# Patient Record
Sex: Female | Born: 1945
Health system: Southern US, Community
[De-identification: ages and names within clinical notes are randomized; demographics above are authoritative.]

## PROBLEM LIST (undated history)

## (undated) DIAGNOSIS — I10 Essential (primary) hypertension: Secondary | ICD-10-CM

## (undated) DIAGNOSIS — C801 Malignant (primary) neoplasm, unspecified: Secondary | ICD-10-CM

## (undated) DIAGNOSIS — G4730 Sleep apnea, unspecified: Secondary | ICD-10-CM

## (undated) DIAGNOSIS — M1990 Unspecified osteoarthritis, unspecified site: Secondary | ICD-10-CM

## (undated) DIAGNOSIS — J302 Other seasonal allergic rhinitis: Secondary | ICD-10-CM

## (undated) DIAGNOSIS — I4891 Unspecified atrial fibrillation: Secondary | ICD-10-CM

## (undated) HISTORY — PX: WISDOM TOOTH EXTRACTION: SHX21

## (undated) HISTORY — PX: KNEE SURGERY: SHX244

## (undated) HISTORY — PX: BREAST EXCISIONAL BIOPSY: SUR124

## (undated) HISTORY — DX: Essential (primary) hypertension: I10

## (undated) HISTORY — DX: Other seasonal allergic rhinitis: J30.2

---

## 1997-09-04 ENCOUNTER — Other Ambulatory Visit: Admission: RE | Admit: 1997-09-04 | Discharge: 1997-09-04 | Payer: Self-pay | Admitting: Gynecology

## 1998-11-02 ENCOUNTER — Encounter: Admission: RE | Admit: 1998-11-02 | Discharge: 1998-11-02 | Payer: Self-pay | Admitting: Gynecology

## 1998-11-02 ENCOUNTER — Encounter: Payer: Self-pay | Admitting: Gynecology

## 1998-11-15 ENCOUNTER — Encounter: Payer: Self-pay | Admitting: Gynecology

## 1998-11-15 ENCOUNTER — Encounter: Admission: RE | Admit: 1998-11-15 | Discharge: 1998-11-15 | Payer: Self-pay | Admitting: Gynecology

## 1998-12-24 ENCOUNTER — Encounter: Payer: Self-pay | Admitting: Surgery

## 1998-12-24 ENCOUNTER — Encounter (INDEPENDENT_AMBULATORY_CARE_PROVIDER_SITE_OTHER): Payer: Self-pay | Admitting: Specialist

## 1998-12-24 ENCOUNTER — Ambulatory Visit (HOSPITAL_COMMUNITY): Admission: RE | Admit: 1998-12-24 | Discharge: 1998-12-24 | Payer: Self-pay | Admitting: Surgery

## 1999-06-19 ENCOUNTER — Encounter: Admission: RE | Admit: 1999-06-19 | Discharge: 1999-06-19 | Payer: Self-pay | Admitting: Surgery

## 1999-06-19 ENCOUNTER — Encounter: Payer: Self-pay | Admitting: Surgery

## 1999-12-05 ENCOUNTER — Encounter: Payer: Self-pay | Admitting: Gynecology

## 1999-12-05 ENCOUNTER — Encounter: Admission: RE | Admit: 1999-12-05 | Discharge: 1999-12-05 | Payer: Self-pay | Admitting: Gynecology

## 2000-12-07 ENCOUNTER — Encounter: Admission: RE | Admit: 2000-12-07 | Discharge: 2000-12-07 | Payer: Self-pay | Admitting: Gynecology

## 2000-12-07 ENCOUNTER — Encounter: Payer: Self-pay | Admitting: Gynecology

## 2001-11-12 ENCOUNTER — Encounter: Payer: Self-pay | Admitting: Emergency Medicine

## 2001-11-12 ENCOUNTER — Emergency Department (HOSPITAL_COMMUNITY): Admission: EM | Admit: 2001-11-12 | Discharge: 2001-11-12 | Payer: Self-pay | Admitting: Emergency Medicine

## 2001-11-23 ENCOUNTER — Other Ambulatory Visit: Admission: RE | Admit: 2001-11-23 | Discharge: 2001-11-23 | Payer: Self-pay | Admitting: Gynecology

## 2001-12-15 ENCOUNTER — Encounter: Payer: Self-pay | Admitting: Gynecology

## 2001-12-15 ENCOUNTER — Encounter: Admission: RE | Admit: 2001-12-15 | Discharge: 2001-12-15 | Payer: Self-pay | Admitting: Gynecology

## 2002-07-04 ENCOUNTER — Encounter (INDEPENDENT_AMBULATORY_CARE_PROVIDER_SITE_OTHER): Payer: Self-pay | Admitting: *Deleted

## 2002-07-04 ENCOUNTER — Ambulatory Visit (HOSPITAL_COMMUNITY): Admission: RE | Admit: 2002-07-04 | Discharge: 2002-07-04 | Payer: Self-pay | Admitting: Gastroenterology

## 2002-07-08 ENCOUNTER — Encounter: Payer: Self-pay | Admitting: Gastroenterology

## 2002-07-08 ENCOUNTER — Ambulatory Visit (HOSPITAL_COMMUNITY): Admission: RE | Admit: 2002-07-08 | Discharge: 2002-07-08 | Payer: Self-pay | Admitting: Gastroenterology

## 2002-12-13 ENCOUNTER — Other Ambulatory Visit: Admission: RE | Admit: 2002-12-13 | Discharge: 2002-12-13 | Payer: Self-pay | Admitting: Gynecology

## 2003-01-07 HISTORY — PX: OOPHORECTOMY: SHX86

## 2003-01-07 HISTORY — PX: VAGINAL HYSTERECTOMY: SUR661

## 2003-01-10 ENCOUNTER — Encounter: Admission: RE | Admit: 2003-01-10 | Discharge: 2003-01-10 | Payer: Self-pay | Admitting: Gynecology

## 2003-02-01 ENCOUNTER — Observation Stay (HOSPITAL_COMMUNITY): Admission: RE | Admit: 2003-02-01 | Discharge: 2003-02-02 | Payer: Self-pay | Admitting: Gynecology

## 2003-02-01 ENCOUNTER — Encounter (INDEPENDENT_AMBULATORY_CARE_PROVIDER_SITE_OTHER): Payer: Self-pay | Admitting: Specialist

## 2004-01-12 ENCOUNTER — Encounter: Admission: RE | Admit: 2004-01-12 | Discharge: 2004-01-12 | Payer: Self-pay | Admitting: Gynecology

## 2004-01-16 ENCOUNTER — Other Ambulatory Visit: Admission: RE | Admit: 2004-01-16 | Discharge: 2004-01-16 | Payer: Self-pay | Admitting: Gynecology

## 2004-11-08 ENCOUNTER — Encounter: Admission: RE | Admit: 2004-11-08 | Discharge: 2004-11-08 | Payer: Self-pay | Admitting: Orthopedic Surgery

## 2004-12-04 ENCOUNTER — Ambulatory Visit (HOSPITAL_BASED_OUTPATIENT_CLINIC_OR_DEPARTMENT_OTHER): Admission: RE | Admit: 2004-12-04 | Discharge: 2004-12-04 | Payer: Self-pay | Admitting: Orthopedic Surgery

## 2004-12-04 ENCOUNTER — Ambulatory Visit (HOSPITAL_COMMUNITY): Admission: RE | Admit: 2004-12-04 | Discharge: 2004-12-04 | Payer: Self-pay | Admitting: Orthopedic Surgery

## 2005-01-22 ENCOUNTER — Other Ambulatory Visit: Admission: RE | Admit: 2005-01-22 | Discharge: 2005-01-22 | Payer: Self-pay | Admitting: Gynecology

## 2005-02-18 ENCOUNTER — Encounter: Admission: RE | Admit: 2005-02-18 | Discharge: 2005-02-18 | Payer: Self-pay | Admitting: Gynecology

## 2006-04-13 ENCOUNTER — Other Ambulatory Visit: Admission: RE | Admit: 2006-04-13 | Discharge: 2006-04-13 | Payer: Self-pay | Admitting: Gynecology

## 2006-04-14 ENCOUNTER — Encounter: Admission: RE | Admit: 2006-04-14 | Discharge: 2006-04-14 | Payer: Self-pay | Admitting: Gynecology

## 2007-04-29 ENCOUNTER — Other Ambulatory Visit: Admission: RE | Admit: 2007-04-29 | Discharge: 2007-04-29 | Payer: Self-pay | Admitting: Gynecology

## 2007-05-04 ENCOUNTER — Encounter: Admission: RE | Admit: 2007-05-04 | Discharge: 2007-05-04 | Payer: Self-pay | Admitting: Gynecology

## 2008-05-08 ENCOUNTER — Encounter: Admission: RE | Admit: 2008-05-08 | Discharge: 2008-05-08 | Payer: Self-pay | Admitting: Gynecology

## 2008-05-08 ENCOUNTER — Other Ambulatory Visit: Admission: RE | Admit: 2008-05-08 | Discharge: 2008-05-08 | Payer: Self-pay | Admitting: Gynecology

## 2008-05-08 ENCOUNTER — Ambulatory Visit: Payer: Self-pay | Admitting: Gynecology

## 2008-05-08 ENCOUNTER — Encounter: Payer: Self-pay | Admitting: Gynecology

## 2009-09-17 ENCOUNTER — Encounter
Admission: RE | Admit: 2009-09-17 | Discharge: 2009-09-17 | Payer: Self-pay | Source: Home / Self Care | Admitting: Gynecology

## 2009-09-17 ENCOUNTER — Ambulatory Visit: Payer: Self-pay | Admitting: Gynecology

## 2009-09-17 ENCOUNTER — Other Ambulatory Visit
Admission: RE | Admit: 2009-09-17 | Discharge: 2009-09-17 | Payer: Self-pay | Source: Home / Self Care | Admitting: Gynecology

## 2009-11-14 ENCOUNTER — Ambulatory Visit: Payer: Self-pay | Admitting: Gynecology

## 2010-01-11 ENCOUNTER — Encounter
Admission: RE | Admit: 2010-01-11 | Discharge: 2010-01-11 | Payer: Self-pay | Source: Home / Self Care | Attending: Family Medicine | Admitting: Family Medicine

## 2010-02-04 LAB — COMPREHENSIVE METABOLIC PANEL
ALT: 22 U/L (ref 0–35)
Albumin: 3.8 g/dL (ref 3.5–5.2)
Alkaline Phosphatase: 91 U/L (ref 39–117)
CO2: 29 mEq/L (ref 19–32)
Calcium: 10 mg/dL (ref 8.4–10.5)
Creatinine, Ser: 0.63 mg/dL (ref 0.4–1.2)
GFR calc Af Amer: >60 mL/min (ref >60)
GFR calc non Af Amer: >60 mL/min (ref >60)
Potassium: 3.7 mEq/L (ref 3.5–5.1)
Sodium: 139 mEq/L (ref 135–145)
Total Bilirubin: 0.4 mg/dL (ref 0.3–1.2)
Total Protein: 6.6 g/dL (ref 6.0–8.3)

## 2010-02-04 LAB — URINALYSIS, ROUTINE W REFLEX MICROSCOPIC
Bilirubin Urine: NEGATIVE
Protein, ur: NEGATIVE mg/dL
Specific Gravity, Urine: 1.012 (ref 1.005–1.030)
Urine Glucose, Fasting: NEGATIVE mg/dL
pH: 6.5 (ref 5.0–8.0)

## 2010-02-04 LAB — SURGICAL PCR SCREEN
MRSA, PCR: NEGATIVE
Staphylococcus aureus: NEGATIVE

## 2010-02-04 LAB — DIFFERENTIAL
Basophils Absolute: 0 10*3/uL (ref 0.0–0.1)
Basophils Relative: 1 % (ref 0–1)
Eosinophils Absolute: 0.2 10*3/uL (ref 0.0–0.7)
Eosinophils Relative: 2 % (ref 0–5)
Lymphocytes Relative: 22 % (ref 12–46)
Lymphs Abs: 1.7 10*3/uL (ref 0.7–4.0)
Monocytes Relative: 8 % (ref 3–12)
Neutro Abs: 5.2 10*3/uL (ref 1.7–7.7)

## 2010-02-04 LAB — URINE MICROSCOPIC-ADD ON

## 2010-02-04 LAB — CBC
HCT: 38.5 % (ref 36.0–46.0)
Hemoglobin: 12.1 g/dL (ref 12.0–15.0)
MCH: 24.6 pg — ABNORMAL LOW (ref 26.0–34.0)
MCHC: 31.4 g/dL (ref 30.0–36.0)
MCV: 78.3 fL (ref 78.0–100.0)
RBC: 4.92 MIL/uL (ref 3.87–5.11)
RDW: 15.2 % (ref 11.5–15.5)
WBC: 7.7 10*3/uL (ref 4.0–10.5)

## 2010-02-06 ENCOUNTER — Other Ambulatory Visit (HOSPITAL_COMMUNITY): Payer: Self-pay | Admitting: General Surgery

## 2010-02-06 ENCOUNTER — Other Ambulatory Visit: Payer: Self-pay | Admitting: General Surgery

## 2010-02-06 ENCOUNTER — Ambulatory Visit (HOSPITAL_COMMUNITY)
Admission: RE | Admit: 2010-02-06 | Discharge: 2010-02-06 | Disposition: A | Discharge status: Home / Self Care | Payer: Medicare Other | Source: Ambulatory Visit | Attending: General Surgery | Admitting: General Surgery

## 2010-02-06 ENCOUNTER — Observation Stay (HOSPITAL_COMMUNITY)
Admission: RE | Admit: 2010-02-06 | Discharge: 2010-02-07 | Disposition: A | Discharge status: Home / Self Care | Payer: Medicare Other | Attending: General Surgery | Admitting: General Surgery

## 2010-02-06 DIAGNOSIS — K819 Cholecystitis, unspecified: Secondary | ICD-10-CM

## 2010-02-06 DIAGNOSIS — E785 Hyperlipidemia, unspecified: Secondary | ICD-10-CM | POA: Insufficient documentation

## 2010-02-06 DIAGNOSIS — K8010 Calculus of gallbladder with chronic cholecystitis without obstruction: Principal | ICD-10-CM | POA: Insufficient documentation

## 2010-02-06 DIAGNOSIS — I10 Essential (primary) hypertension: Secondary | ICD-10-CM | POA: Insufficient documentation

## 2010-02-06 DIAGNOSIS — M1710 Unilateral primary osteoarthritis, unspecified knee: Secondary | ICD-10-CM | POA: Insufficient documentation

## 2010-02-06 HISTORY — PX: CHOLECYSTECTOMY: SHX55

## 2010-02-09 NOTE — Op Note (Signed)
Bridget [name], Bridget [name]             ACCOUNT NO.:  [id number]  MEDICAL RECORD NO.:  [id number]           PATIENT TYPE:  O  LOCATION:  SDSC                         FACILITY:  [facility]  PHYSICIAN:  Angelia Mould. Derrell Lolling, M.D.DATE OF BIRTH:  1945/07/16  DATE OF PROCEDURE:  02/06/2010 DATE OF DISCHARGE:                              OPERATIVE REPORT   PREOPERATIVE DIAGNOSIS:  Chronic cholecystitis with cholelithiasis  POSTOPERATIVE DIAGNOSIS:  Chronic cholecystitis with cholelithiasis  OPERATION PERFORMED:  Laparoscopic cholecystectomy with intraoperative cholangiogram.  SURGEON:  Angelia Mould. Derrell Lolling, MD  FIRST ASSISTANT:  Anselm Pancoast. Zachery Dakins, MD  OPERATIVE INDICATIONS:  This is a 65 year old Caucasian female with hypertension, hyperlipidemia, and degenerative joint disease of the knee.  She has had two recent episodes of biliary colic, which led to evaluation.  Lab work and liver function tests are normal.  Gallbladder ultrasound shows a single 1.3 cm gallstone and fatty infiltration of the liver.  Common bile duct was not dilated.  She was counseled as an outpatient, and is brought to operating room electively for cholecystectomy.  OPERATIVE FINDINGS:  The gallbladder looked a little bit discolored and chronically inflamed, but was fairly thin walled.  The cholangiogram was normal, showing normal intrahepatic and extrahepatic biliary anatomy, no filling defect, and no obstruction with good flow of contrast into the duodenum.  The liver, stomach, duodenum, small intestine, large intestine, and omentum were grossly normal to inspection.  OPERATIVE TECHNIQUE:  Following the induction of general endotracheal anesthesia, the patient's abdomen was prepped and draped in a sterile fashion.  Intravenous antibiotics were given.  The patient was identified as correct patient and correct procedure, and a time-out was held.  Marcaine 0.5% with epinephrine was used for local  infiltration anesthetic.  A vertically oriented incision was made in the lower rim of the umbilicus.  The fascia was incised in the midline, and the abdominal cavity entered under direct vision.  An 11-mm Hasson trocar was inserted and secured with pursestring suture of 0 Vicryl.  Pneumoperitoneum was created.  Video cam was inserted with visualization and findings as described above.  An 11-mm trocar was placed in subxiphoid region, and two 5-mm trocars placed in the right upper quadrant.  The gallbladder fundus was elevated.  Some adhesions were taken down off the infundibulum, and the infundibulum was retracted laterally.  I dissected the peritoneum off of the cystic duct and the cystic artery and isolated these structures.  The cystic artery was secured with multiple metal clips and divided just as it went onto the wall of the gallbladder. This created a nice critical view behind the cystic duct.  A cholangiogram catheter was inserted into the cystic duct.  A cholangiogram was obtained using the C-arm.  The cholangiogram was normal as described above.  The cholangiogram catheter was removed, the cystic duct was secured with three additional metal clips and then divided.  The gallbladder was then dissected from its bed with electrocautery, placed in the specimen bag and removed.  The operative field was hemostatic.  Irrigation fluid was completely clear.  There was no evidence of any bleeding or bile leak whatsoever.  The  trocars were removed under direct vision.  There was no bleeding from the trocar sites.  The pneumoperitoneum was released.  The fascia at the umbilicus closed with 0 Vicryl sutures.  The skin incisions were closed with subcuticular sutures of 4-0 Monocryl and Dermabond.  The patient tolerated the procedure well and was taken to recovery room in stable condition.  Estimated blood loss was about 10 mL.  Complications none. Sponge, needle, and instruments counts were  correct.     Angelia Mould. Derrell Lolling, M.D.     HMI/MEDQ  D:  02/06/2010  T:  02/07/2010  Job:  [id number]  cc:   Duncan Dull, M.D. Timothy P. Fontaine, M.D.  Electronically Signed by Claud Kelp M.D. on 02/09/2010 12:49:22 PM

## 2010-05-24 NOTE — H&P (Signed)
NAME:  Bridget [name], Bridget [name]                       ACCOUNT NO.:  [id number]   MEDICAL RECORD NO.:  [id number]                   PATIENT TYPE:  INP   LOCATION:  NA                                   [facility]AC[facility]   PHYSICIAN:  Timothy P. Fontaine, M.D.           DATE OF BIRTH:  01/06/1946   DATE OF ADMISSION:  02/01/2003  DATE OF DISCHARGE:                                HISTORY & PHYSICAL   Patient being admitted to [facility] February 01, 2003 at 7:30 a.m. for  day surgery.   CHIEF COMPLAINT:  Worsening cystocele, rectocele, uterine prolapse.   HISTORY OF PRESENT ILLNESS:  [age]-year-old G3, P2, AB1 female with a  long history of uterine prolapse, cystocele, rectocele which has  progressively gotten worse and is now significantly symptomatic.  Patient is  admitted at this time for TVH/BSO/A&P repair.  On questioning the patient  does not have stress urinary incontinence but is mainly bothered by  something protruding through the vagina.   PAST MEDICAL HISTORY:  High blood pressure.   PAST SURGICAL HISTORY:  Unremarkable.   CURRENT MEDICATIONS:  Lotensin, multivitamins, calcium.   ALLERGIES:  CODEINE causes nausea/vomiting.   FAMILY HISTORY:  Unremarkable.   SOCIAL HISTORY:  Unremarkable.   REVIEW OF SYSTEMS:  Noncontributory.   ADMISSION PHYSICAL EXAMINATION:  VITAL SIGNS:  Afebrile, vital signs are  stable, blood pressure 122/80.  HEENT:  Normal.  LUNGS:  Clear.  CARDIAC:  Regular rate without rubs, murmurs, or gallops.  ABDOMEN:  Benign.  PELVIC:  External/BUS/vagina with second degree cystocele, first degree  rectocele.  Cervix within fingertip of vaginal opening with straining,  cervix grossly normal.  Uterus normal size, midline, immobile, nontender.  Adnexa without masses or tenderness.  Rectovaginal exam confirms rectocele.   ASSESSMENT:  [age]-year-old G3, P2, AB1 female with long history of  uterine prolapse, cystocele, rectocele which has  progressively gotten worse  now that it is bothersome wants surgical repair.  Patient has no significant  history of urinary incontinence, had an ultrasound this past year which  showed a normal-size uterus with several small myomas, adnexa without  pathology.  Options for management were reviewed with the patient and she  wants to proceed with a surgical repair.  Proposed surgery expected  intraoperative/postoperative courses were reviewed with her risks, benefits,  indications, and alternatives.  We will plan on vaginal hysterectomy with  bilateral salpingo-oophorectomy if possible.  Discussed with patient and her  daughter that we may not be able to remove both ovaries vaginally and if  they appear normal or palpate normal the issue is whether we should make  further surgery such as laparoscopic or exploratory laparotomy for ovarian  retrieval.  The patient prefers not to make a separate incision, that if I  cannot retrieve one or both ovaries that I can leave these, she understands  the long-term risks of ovarian cancer and other pathologies which would  require reoperation  and understands and accepts this.  Patient also clearly  understands that at any time during the procedure I may need to switch to an  abdominal approach if I feel it is unsafe to proceed with the vaginal  approach and/or complications arise and she understands and accepts this.  I  also reviewed with her she is not having symptoms of urinary incontinence  therefore, we will not proceed with any type of bladder suspension.  She  further understands that by repairing her cystocele she may develop symptoms  of urinary incontinence and may need to be further evaluated by a urologist  at that time to consider a bladder suspension-type procedure.  Sexuality  following hysterectomy was also reviewed with her and the potential for  orgasmic dysfunction as well as persistent dyspareunia was discussed,  understood, and  accepted.  The risks of infection both internal requiring  prolonged antibiotics, abscess formation requiring reoperation and drainage  of abscess collections as well as if indeed incisions are made abdominally  the potential for wound infections and wound complications requiring opening  and draining of incisions and closure by secondary intention was discussed,  understood, and accepted.  The risk of hemorrhage necessitating transfusion  and the risks of transfusion were reviewed with her to include transfusion  reaction, hepatitis, human immunodeficiency virus, mad cow disease and other  unknown entities.  The risk of inadvertant injury to internal organs  including bowel, bladder, ureters, vessels, and nerves necessitating major  exploratory reparative surgeries and future reparative surgeries including  ostomy formation was discussed, understood, and accepted.  The patient  understands that we are working closely with the bladder as well as the  rectum and that realistic injury to these structures was discussed,  understood, and accepted.  I also reviewed with her the risks of surgery in  general to include the risks of venous thrombosis, pulmonary embolus,  stroke, heart attack and we will plan on intermittent pneumatic thigh-high  stockings.  The need for early and persistent ambulation following the  surgery was also discussed with her and her daughter.  The patient's  questions were answered to her satisfaction and she is ready to proceed with  surgery.                                               Timothy P. Audie Box, M.D.    TPF/MEDQ  D:  01/24/2003  T:  01/25/2003  Job:  [id number]

## 2010-05-24 NOTE — Discharge Summary (Signed)
NAME:  Bridget [name], Bridget [name]                       ACCOUNT NO.:  [id number]   MEDICAL RECORD NO.:  [id number]                   PATIENT TYPE:  OBV   LOCATION:  9310                                 FACILITY:  [facility]   PHYSICIAN:  Timothy P. Fontaine, M.D.           DATE OF BIRTH:  04/23/1945   DATE OF ADMISSION:  02/01/2003  DATE OF DISCHARGE:  02/02/2003                                 DISCHARGE SUMMARY   DISCHARGE DIAGNOSIS:  Cystocele, rectocele, uterine prolapse.   PROCEDURE:  Total vaginal hysterectomy with a right salpingo-oophorectomy,  anterior and posterior colporrhaphy.   HISTORY OF PRESENT ILLNESS:  A 65 year old, gravida 3, para 2, AB1, with a  long history of uterine prolapse, cystocele, rectocele, which has gotten  progressively worse. She is now significantly symptomatic and she is  admitted at this time for total vaginal hysterectomy, right salpingo-  oophorectomy, and A&P repair.  She states that she has noted increased  stress urinary continence, but is mainly bothered by something protruding  through her vagina. Significant history is hypertension which she does take  Lotensin for.   HOSPITAL COURSE:  She was admitted to the hospital on February 01, 2003, and  she had a total vaginal hysterectomy with a right salpingo-oophorectomy and  A&P repair. She did well postoperatively and she was discharged home on her  first postoperative day. She remained afebrile. She was able to void. She  was discharged home on her first postoperative day.   DISPOSITION:  Discharged home. Instructed to follow up in the office in two  weeks or as needed. A prescription for Tylox as needed is given.  Postoperative care was reviewed. Resume Lotensin.     Davonna Belling. Young, N.P.                      Timothy P. Audie Box, M.D.    Bridget [name]  D:  03/09/2003  T:  03/09/2003  Job:  [id number]

## 2010-05-24 NOTE — Op Note (Signed)
NAMELORRAINE, CIMMINO             ACCOUNT NO.:  [id number]   MEDICAL RECORD NO.:  [id number]          PATIENT TYPE:  AMB   LOCATION:  DSC                          FACILITY:  [facility]   PHYSICIAN:  Mila Homer. Sherlean Foot, M.D. DATE OF BIRTH:  [birth year]   DATE OF PROCEDURE:  12/04/2004  DATE OF DISCHARGE:                                 OPERATIVE REPORT   PREOPERATIVE DIAGNOSIS:  Left knee arthritis and medial meniscal tear.   POSTOPERATIVE DIAGNOSIS:  Left knee arthritis and medial and lateral  meniscal tears.   PROCEDURE:  Left knee arthroscopy with partial medial and partial lateral  meniscectomies, chondroplasty of the medial and patellofemoral compartment.   INDICATION FOR PROCEDURE:  The patient is a 65 year old white female with  mechanical symptoms.  Informed consent was obtained.   DESCRIPTION OF PROCEDURE:  The patient was taken to the operating room and  administered MAC.  The left lower extremity was prepped and draped in the  usual sterile fashion.  A #11 blade, blunt trocar and cannula were used to  create inferolateral and inferolateral medial portals.  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  Completely eburnated bone was present.  There were a couple of  loose edges in the margins and these were debrided with the Automatic Data  shaver.  There were some loose bodies in the joint and these were debrided  as well.  I then went into the medial compartment.  There was a  posterolateral meniscal tear; it was not really that large; I do think it  definitively could be asymptomatic; this was debrided with straight basket  and upbiting basket forceps and a Automatic Data shaver.  There was grade 3  chondromalacia over much of the medial femoral condyle and grade 2 over much  of the tibial plateau.  ACL and PCL were normal.  There was a large  osteophyte on the lateral wall of the medial femoral condyle and the notch.  The lateral  compartment actually had grade 1 chondromalacia only, but there  was degenerative tearing along the entire circumference of the lateral  meniscus; this was cleaned up and debrided with a straight basket forceps,  upbiting basket forceps and a Biochemist, clinical.  I then lavaged the joint,  took 1 further tour and then evacuated the knee of fluid and instruments,  closed with 4-0 nylon sutures and dressed with Xeroform, dressing sponges,  sterile Webril and an Ace wrap.   COMPLICATIONS:  None.   DRAINS:  None.           ______________________________  Mila Homer. Sherlean Foot, M.D.     SDL/MEDQ  D:  12/04/2004  T:  12/05/2004  Job:  [id number]

## 2010-05-24 NOTE — Op Note (Signed)
NAME:  Bridget [name], Bridget [name]                       ACCOUNT NO.:  [id number]   MEDICAL RECORD NO.:  [id number]                   PATIENT TYPE:  INP   LOCATION:  9399                                 FACILITY:  [facility]   PHYSICIAN:  Timothy P. Fontaine, M.D.           DATE OF BIRTH:  08-08-1945   DATE OF PROCEDURE:  02/01/2003  DATE OF DISCHARGE:                                 OPERATIVE REPORT   PREOPERATIVE DIAGNOSES:  1. Cystocele.  2. Rectocele.  3. Uterine prolapse.   POSTOPERATIVE DIAGNOSES:  1. Cystocele.  2. Rectocele.  3. Uterine prolapse.  4. Leiomyomata.   PROCEDURE:  1. Total vaginal hysterectomy, right salpingo-oophorectomy.  2. Anterior and posterior colporrhaphy.   SURGEON:  Timothy P. Fontaine, M.D.   ASSISTANT:  Ivor Costa. Farrel Gobble, M.D.   ANESTHESIA:  General endotracheal.   COMPLICATIONS:  None.   ESTIMATED BLOOD LOSS:  200 mL.   SPECIMENS:  Uterine, right fallopian tube, right ovary.   FINDINGS:  Uterus with subserosal pedunculated small leiomyomata, fundal  portion, otherwise grossly normal.  Right fallopian tube and ovary grossly  normal.  Left fallopian tube and ovary grossly normal, high on the pelvic  sidewall, clearly visualized, left in situ due to risk of removal.  No  evidence of cul-de-sac endometriosis or adhesive disease.   DESCRIPTION OF PROCEDURE:  The patient was taken to the operating room and  properly identified and underwent general endotracheal anesthesia without  difficulty.  She was placed in the high dorsal lithotomy position and  received a perineal and vaginal preparation with Betadine solution per  nursing personnel, and a Foley catheterization was performed.  EUA was  performed which was grossly normal, consistent with her preoperative  diagnoses.  The patient was draped in the usual fashion.  The cervix was  visualized with surgical speculums and the anterior lip grasped with a  tenaculum.  The cervical mucosa was circumferentially  injected using  epinephrine-lidocaine mixture.  A total of 8 mL were used.  The cervical  mucosa was then circumferentially incised and the paracervical plane sharply  developed.  The posterior cul-de-sac was then sharply entered without  difficulty and a long weighted speculum was placed.  The right and left  uterosacral ligaments were identified and clamped, cut, and ligated using 0  Vicryl suture, and tagged for future reference.  The paracervical tissues  were then freed with clamping, cutting, and ligating of the paracervical  tissues using 0 Vicryl suture.  The anterior cul-de-sac was then developed  and ultimately entered without difficulty, and the uterus was then  progressively freed from its attachments through clamping, cutting, and  ligating of the parametrial tissues using 0 Vicryl suture.  The uterus was  then delivered through the vagina and the uterine-ovarian pedicles were  doubly clamped bilaterally and the uterus was excised.  The right adnexa was  inspected, the ovary and tube grasped with a Babcock and brought down,  allowing clamping of the infundibulopelvic ligament and vessels.  The ovary  and tube were then removed and the pedicle was doubly ligated using 0 Vicryl  suture in a single stitch followed by a suture ligature.  Inspection of the  left adnexa was normal.  The ovary and fallopian tube were clearly  visualized, and the ovary appeared to be postmenopausal and inactive.  Several attempts were made to deliver the ovary and fallopian tube to a  level where I could clamp the infundibulopelvic ligament and vessels but due  to its high position, I felt it was unsafe to attempt delivery and per  previous discussion with the patient, this was left in situ.  The uterine-  ovarian pedicle was doubly ligated using 0 Vicryl suture with a suture  ligature followed by a simple stitch.  The long weighted speculum was  removed, a short weighted speculum placed, and the  posterior cuff was run  from uterosacral ligament to uterosacral ligament using 0 Vicryl suture in a  running interlocking stitch.  Attention was then turned to the cystocele  repair.  The vaginal mucosa was grasped with Allis clamps at the level of  the incision line from the hysterectomy and through progressive sharp and  blunt dissection and sequential grasping of Allis clamps, the vaginal mucosa  was freed and the vesicovaginal space opened in a linear fashion to  approximately two fingerbreadths below the urethral opening.  The  vesicovaginal plane was then developed bilaterally, the cystocele was  reduced, and there was identification of the pubovesical fascia bilaterally  and through interrupted 2-0 Vicryl imbricating sutures, the cystocele was  progressively reduced and the fascia was reapproximated.  The excess vaginal  mucosa was then excised and the vaginal mucosa was then reapproximated  anteriorly using 2-0 Vicryl sutures in a running locking stitch,  incorporating the underlying pubovaginal fascia to close the dead space.  The cuff vaginal mucosa was then closed anterior to posterior using 0 Vicryl  suture in interrupted figure-of-eight stitch.  Attention was then turned to  the posterior repair and the posterior fourchette was then grasped on either  side with Allis clamps and the midline perineal posterior fourchette region  was then incised to begin the repair.  The vagina was then freed from the  rectovaginal space through progressive blunt and sharp dissection and  clamping with Allis clamps in a linear fashion to the level of approximately  one fingerbreadth below the vaginal cuff.  The rectovaginal perirectal  spaces were sharply and bluntly developed.  Subsequently the rectocele was  reduced through interrupted 2-0 Vicryl imbricating sutures to completely  reduce the rectocele.  The excess vaginal mucosa was then excised and the vaginal mucosa was then reapproximated  using 2-0 Vicryl in a running stitch  with incorporation of the underlying tissues to obliterate the dead space.  There was adequate hemostasis visualized at the end of the procedure, and  the vagina was packed using two-inch gauze with Estrace cream.  A Foley  catheter was placed after the cystocele repair with free-flowing clear  yellow urine noted.  The patient was placed in the supine position and was  taken to the recovery room after awakening without difficulty, having  tolerated the procedure well.  Timothy P. Audie Box, M.D.    TPF/MEDQ  D:  02/01/2003  T:  02/01/2003  Job:  [id number]

## 2010-05-24 NOTE — Op Note (Signed)
NAME:  Bridget [name], Bridget [name]                       ACCOUNT NO.:  [id number]   MEDICAL RECORD NO.:  [id number]                   PATIENT TYPE:  AMB   LOCATION:  ENDO                                 FACILITY:  [facility]   PHYSICIAN:  Anselmo Rod, M.D.               DATE OF BIRTH:  Nov 07, 1945   DATE OF PROCEDURE:  07/04/2002  DATE OF DISCHARGE:                                 OPERATIVE REPORT   PROCEDURE:  Screening Colonoscopy.   ENDOSCOPIST:  Anselmo Rod, M.D.   INSTRUMENT USED:  Olympus video colonoscope.   INDICATIONS FOR PROCEDURE:  A 65 year old white female undergoing screening  colonoscopy. The patient has a history of iron deficiency anemia and has  essentially unrevealing EGD with regards to blood loss. The patient has  multiple small polyps seen on the EGD. Rule out colonic polyps, masses, etc.   PREPROCEDURE PREPARATION:  Informed consent was procured from the patient.  The patient fasted for eight hours prior to the procedure and prepped with a  bottle of magnesium citrate and a gallon of GoLYTELY the night prior to the  procedure.   PREPROCEDURE PHYSICAL:  The patient had stable vital signs. Neck supple.  Chest clear to auscultation. S1, S2 regular. Abdomen soft with normal bowel  sounds.   DESCRIPTION OF PROCEDURE:  The patient was placed in the left lateral  decubitus position and sedated with  and additional 30 mg of Demerol and 2  mg of Versed intravenously. Once the patient was adequately sedated and  maintained on low flow oxygen and continuous cardiac monitoring, the Olympus  video colonoscope was advanced from the rectum to the hepatic flexure with  difficulty. There was a large amount of residual stool in the colon,  multiple washings were done.  Scattered diverticula were seen throughout the  colon. Small internal hemorrhoids were seen on retroflexion of the rectum.  The patient had a very tortuous colon. In spite prolonged efforts to reach  the cecum,  we were unable to do so. The patient's position was changed from  the left lateral to the supine, right lateral and prone position with gentle  application of abdominal pressure to reach the cecum. The hepatic flexure  was identified but in spite efforts mentioned above, the scope could not be  advanced over the cecum. There was a large amount of residual stool in the  right colon, the procedure was aborted at this point. We plan to do an air  contrast barium enema at a later date.   IMPRESSION:  1. Scattered diverticulosis.  2. Small nonbleeding internal hemorrhoids seen on retroflexion.  3. Very tortous colon procedure aborted at hepatic flexure.    RECOMMENDATIONS:  Air contrast barium enema will be done to complete the  evaluation, further recommendations made thereafter.  Anselmo Rod, M.D.    JNM/MEDQ  D:  07/04/2002  T:  07/05/2002  Job:  [id number]   cc:   Duwayne Heck L. Mahaffey, M.D.  [address]  [place]  [place] [address]  Fax: [phone]

## 2010-05-24 NOTE — Op Note (Signed)
   NAME:  Bridget [name], Bridget [name]                       ACCOUNT NO.:  [id number]   MEDICAL RECORD NO.:  [id number]                   PATIENT TYPE:  AMB   LOCATION:  ENDO                                 FACILITY:  [facility]   PHYSICIAN:  Anselmo Rod, M.D.               DATE OF BIRTH:  Jan 04, 1946   DATE OF PROCEDURE:  07/04/2002  DATE OF DISCHARGE:                                 OPERATIVE REPORT   PROCEDURE PERFORMED:  Esophagogastroduodenoscopy.   ENDOSCOPIST:  Charna Elizabeth, M.D.   INSTRUMENT USED:  Olympus video panendoscope.   INDICATIONS FOR PROCEDURE:  Iron deficiency anemia in a 65 year old white  female rule out peptic ulcer disease, esophagitis, gastritis, etc.   PREPROCEDURE PREPARATION:  Informed consent was procured from the patient.  The patient was fasted for eight hours prior to the procedure and prepped  with a bottle of magnesium citrate and a gallon of GoLYTELY the night prior  to the procedure.   PREPROCEDURE PHYSICAL:  The patient had stable vital signs.  Neck supple,  chest clear to auscultation.  S1, S2 regular.  Abdomen soft with normal  bowel sounds.   DESCRIPTION OF PROCEDURE:  The patient was placed in the left lateral  decubitus position and sedated with 70 mg of Demerol and 60 mg of Versed  intravenously.  Once the patient was adequately sedated and maintained on  low-flow oxygen and continuous cardiac monitoring, the Olympus video  panendoscope was advanced through the mouth piece over the tongue into the  esophagus under direct vision.  The entire esophagus appeared normal with no  evidence of ring, stricture, masses, esophagitis or Barrett's mucosa.  Multiple small polyps were seen in the proximal portion of the stomach and  biopsied for pathology.  The antrum appeared healthy.  The duodenal bulb and  the proximal small bowel distal to the bulb appeared normal.  There was no  outlet obstruction.  The patient tolerated the procedure well without  complication.   IMPRESSION:  1. Multiple polyps of varying size in the proximal stomach biopsied for     pathology.  2. Normal-appearing esophagus and proximal small bowel.    RECOMMENDATIONS:  1. Await pathology results.  2. Proceed with colonoscopy at this time.  Further recommendations made     after colonoscopy has been done.                                                Anselmo Rod, M.D.    JNM/MEDQ  D:  07/04/2002  T:  07/05/2002  Job:  [id number]   cc:   Duwayne Heck L. Mahaffey, M.D.  [address]  [place]  [place] [address]  Fax: [phone]

## 2010-12-12 ENCOUNTER — Other Ambulatory Visit: Payer: Self-pay | Admitting: Gynecology

## 2010-12-12 DIAGNOSIS — Z1231 Encounter for screening mammogram for malignant neoplasm of breast: Secondary | ICD-10-CM

## 2010-12-19 DIAGNOSIS — J302 Other seasonal allergic rhinitis: Secondary | ICD-10-CM | POA: Insufficient documentation

## 2010-12-19 DIAGNOSIS — I10 Essential (primary) hypertension: Secondary | ICD-10-CM | POA: Insufficient documentation

## 2010-12-23 ENCOUNTER — Encounter: Payer: Medicare Other | Admitting: Gynecology

## 2010-12-25 ENCOUNTER — Encounter: Payer: Self-pay | Admitting: Gynecology

## 2010-12-25 ENCOUNTER — Encounter: Payer: Medicare Other | Admitting: Gynecology

## 2010-12-25 ENCOUNTER — Ambulatory Visit (INDEPENDENT_AMBULATORY_CARE_PROVIDER_SITE_OTHER): Payer: Medicare Other | Admitting: Gynecology

## 2010-12-25 VITALS — BP 148/72 | Ht 65.0 in | Wt 236.0 lb

## 2010-12-25 DIAGNOSIS — N952 Postmenopausal atrophic vaginitis: Secondary | ICD-10-CM

## 2010-12-25 DIAGNOSIS — L292 Pruritus vulvae: Secondary | ICD-10-CM

## 2010-12-25 DIAGNOSIS — N8111 Cystocele, midline: Secondary | ICD-10-CM

## 2010-12-25 DIAGNOSIS — L293 Anogenital pruritus, unspecified: Secondary | ICD-10-CM

## 2010-12-25 MED ORDER — NYSTATIN-TRIAMCINOLONE 100000-0.1 UNIT/GM-% EX OINT: TOPICAL_OINTMENT | Freq: Two times a day (BID) | CUTANEOUS | Status: AC

## 2010-12-25 NOTE — Progress Notes (Signed)
Bridget [name] 09-27-1945 [id number]        65 y.o.  For follow up.  Past medical history,surgical history, medications, allergies, family history and social history were all reviewed and documented in the EPIC chart. ROS:  Was performed and pertinent positives and negatives are included in the history.  Exam: chaperone present Filed Vitals:   12/25/10 1057  BP: 148/72   General appearance  Normal Skin grossly normal Head/Neck normal with no cervical or supraclavicular adenopathy thyroid normal Lungs  clear Cardiac RR, without RMG Abdominal  soft, nontender, without masses, organomegaly or hernia Breasts  examined lying and sitting without masses, retractions, discharge or axillary adenopathy. Pelvic  Ext/BUS/vagina  Atrophic changes with second degree cystocele cuff well supported no significant rectocele  Adnexa  Without masses or tenderness    Anus and perineum  normal   Rectovaginal  normal sphincter tone without palpated masses or tenderness.    Assessment/Plan:  65 y.o. female for annual exam.    1. Cystocele/atrophic genital changes. She does see Dr. Retta Diones. She is on Estrace pea-sized application 3 times weekly per his recommendation. I discussed the issues of absorption, WHI study, increased risk stroke heart attack DVT and breast cancer issues. Alternatives such as Vagifem with more limited absorption was reviewed. Patient's comfortable with continuing Estrace and accepts the risks. 2. Breast health. SBE monthly reviewed. She has mammogram scheduled in January will follow up for this. 3. Bone health. DEXA November 2011 was normal we'll plan on repeating this at a five-year interval. Increased calcium vitamin D reviewed. 4. Colonoscopy. She had one 3 years ago we'll follow up at a five-year interval per their recommendation. 5. Pap smear. She has no history of abnormal Pap smears in the past and has multiple normal records in her chart, the last in 2011. She is status  post hysterectomy as well as over 65. I discussed current screening guidelines and the options for stopping altogether versus less frequent screening reviewed. We'll readdress this on an annual basis. 6. Health maintenance. No blood work was done today as was all done through her primary who she actively sees. Assuming she continues well from a gynecologic standpoint she will see me in a year, sooner as needed.    Dara Lords MD, 11:24 AM 12/25/2010

## 2011-01-14 ENCOUNTER — Ambulatory Visit
Admission: RE | Admit: 2011-01-14 | Discharge: 2011-01-14 | Disposition: A | Discharge status: Home / Self Care | Payer: Medicare Other | Source: Ambulatory Visit | Attending: Gynecology | Admitting: Gynecology

## 2011-01-14 DIAGNOSIS — Z1231 Encounter for screening mammogram for malignant neoplasm of breast: Secondary | ICD-10-CM

## 2011-01-20 ENCOUNTER — Other Ambulatory Visit: Payer: Self-pay | Admitting: *Deleted

## 2011-01-20 DIAGNOSIS — N63 Unspecified lump in unspecified breast: Secondary | ICD-10-CM

## 2011-01-24 DIAGNOSIS — J0100 Acute maxillary sinusitis, unspecified: Secondary | ICD-10-CM | POA: Diagnosis not present

## 2011-01-28 ENCOUNTER — Ambulatory Visit
Admission: RE | Admit: 2011-01-28 | Discharge: 2011-01-28 | Disposition: A | Discharge status: Home / Self Care | Payer: Medicare Other | Source: Ambulatory Visit | Attending: Gynecology | Admitting: Gynecology

## 2011-01-28 DIAGNOSIS — R928 Other abnormal and inconclusive findings on diagnostic imaging of breast: Secondary | ICD-10-CM | POA: Diagnosis not present

## 2011-01-28 DIAGNOSIS — N63 Unspecified lump in unspecified breast: Secondary | ICD-10-CM

## 2011-02-12 DIAGNOSIS — R7301 Impaired fasting glucose: Secondary | ICD-10-CM | POA: Diagnosis not present

## 2011-02-12 DIAGNOSIS — Z79899 Other long term (current) drug therapy: Secondary | ICD-10-CM | POA: Diagnosis not present

## 2011-02-12 DIAGNOSIS — I10 Essential (primary) hypertension: Secondary | ICD-10-CM | POA: Diagnosis not present

## 2011-02-12 DIAGNOSIS — E7800 Pure hypercholesterolemia, unspecified: Secondary | ICD-10-CM | POA: Diagnosis not present

## 2011-02-12 DIAGNOSIS — E669 Obesity, unspecified: Secondary | ICD-10-CM | POA: Diagnosis not present

## 2011-02-12 DIAGNOSIS — Z1331 Encounter for screening for depression: Secondary | ICD-10-CM | POA: Diagnosis not present

## 2011-06-18 DIAGNOSIS — J029 Acute pharyngitis, unspecified: Secondary | ICD-10-CM | POA: Diagnosis not present

## 2011-06-26 DIAGNOSIS — H25099 Other age-related incipient cataract, unspecified eye: Secondary | ICD-10-CM | POA: Diagnosis not present

## 2011-08-14 DIAGNOSIS — Z79899 Other long term (current) drug therapy: Secondary | ICD-10-CM | POA: Diagnosis not present

## 2011-08-14 DIAGNOSIS — R7301 Impaired fasting glucose: Secondary | ICD-10-CM | POA: Diagnosis not present

## 2011-08-14 DIAGNOSIS — I10 Essential (primary) hypertension: Secondary | ICD-10-CM | POA: Diagnosis not present

## 2011-08-14 DIAGNOSIS — E669 Obesity, unspecified: Secondary | ICD-10-CM | POA: Diagnosis not present

## 2011-08-14 DIAGNOSIS — E7800 Pure hypercholesterolemia, unspecified: Secondary | ICD-10-CM | POA: Diagnosis not present

## 2011-09-23 DIAGNOSIS — Z23 Encounter for immunization: Secondary | ICD-10-CM | POA: Diagnosis not present

## 2012-01-20 DIAGNOSIS — J209 Acute bronchitis, unspecified: Secondary | ICD-10-CM | POA: Diagnosis not present

## 2012-01-20 DIAGNOSIS — J019 Acute sinusitis, unspecified: Secondary | ICD-10-CM | POA: Diagnosis not present

## 2012-02-18 DIAGNOSIS — Z79899 Other long term (current) drug therapy: Secondary | ICD-10-CM | POA: Diagnosis not present

## 2012-02-18 DIAGNOSIS — I10 Essential (primary) hypertension: Secondary | ICD-10-CM | POA: Diagnosis not present

## 2012-02-18 DIAGNOSIS — E7800 Pure hypercholesterolemia, unspecified: Secondary | ICD-10-CM | POA: Diagnosis not present

## 2012-04-09 ENCOUNTER — Other Ambulatory Visit: Payer: Self-pay

## 2012-04-09 DIAGNOSIS — Z1231 Encounter for screening mammogram for malignant neoplasm of breast: Secondary | ICD-10-CM

## 2012-04-13 ENCOUNTER — Ambulatory Visit (INDEPENDENT_AMBULATORY_CARE_PROVIDER_SITE_OTHER): Payer: Medicare Other | Admitting: Gynecology

## 2012-04-13 ENCOUNTER — Encounter: Payer: Self-pay | Admitting: Gynecology

## 2012-04-13 VITALS — BP 132/80 | Ht 64.0 in | Wt 236.0 lb

## 2012-04-13 DIAGNOSIS — N816 Rectocele: Secondary | ICD-10-CM | POA: Diagnosis not present

## 2012-04-13 DIAGNOSIS — N8111 Cystocele, midline: Secondary | ICD-10-CM | POA: Diagnosis not present

## 2012-04-13 DIAGNOSIS — N952 Postmenopausal atrophic vaginitis: Secondary | ICD-10-CM | POA: Diagnosis not present

## 2012-04-13 DIAGNOSIS — N3281 Overactive bladder: Secondary | ICD-10-CM

## 2012-04-13 DIAGNOSIS — N318 Other neuromuscular dysfunction of bladder: Secondary | ICD-10-CM

## 2012-04-13 NOTE — Progress Notes (Signed)
Bridget [name] 1945/12/12 [id number]        67 y.o.  [id number] for followup exam.  Several issues noted below.  Past medical history,surgical history, medications, allergies, family history and social history were all reviewed and documented in the EPIC chart. ROS:  Was performed and pertinent positives and negatives are included in the history.  Exam: Kim assistant Filed Vitals:   04/13/12 1551  BP: 132/80  Height: 5\' 4"  (1.626 m)  Weight: 236 lb (107.049 kg)   General appearance  Normal Skin grossly normal Head/Neck normal with no cervical or supraclavicular adenopathy thyroid normal Lungs  clear Cardiac RR, without RMG Abdominal  soft, nontender, without masses, organomegaly or hernia Breasts  examined lying and sitting without masses, retractions, discharge or axillary adenopathy. Pelvic  Ext/BUS/vagina  Atrophic changes with first to second degree cystocele, cuff well supported, mild rectocele  Adnexa  Without masses or tenderness    Anus and perineum  normal   Rectovaginal  normal sphincter tone without palpated masses or tenderness.    Assessment/Plan:  67 y.o. [id number] female for followup exam.   1. Atrophic genital changes. Uses Estrace cream 3 times weekly. Helps with her atrophic changes of dryness as well as herurgency symptoms also. I again discussed issues of absorption and alternatives to include Vagifem and Osphena. Patient's comfortable continuing the Estrace 3 times weekly. Possible torsion with increased risk of stroke heart attack DVT breast cancer reviewed. Dr. Retta Diones usually prescribes but I told her that when it comes time to refill this I will be happy to do this also. 2. Cystocele/rectocele. Patient stable. We'll continue to monitor. 3. Mammography scheduled next month and she will followup for this. SBE monthly reviewed. 4. DEXA 2011 normal. Recommended repeated five-year interval. Increase calcium vitamin D reviewed. 5. Pap smear 2011. No Pap smear done  today. No history of significant abnormal Pap smears. Reviewed options to stop screening altogether or less frequent screening intervals. We'll readdress on an annual basis. Patient's comfortable with not doing Pap smear today. 6. Colonoscopy 5 years ago. She's not sure she's due for repeat now she knows to call to check on this. 7. Health maintenance. Recently saw her primary and the blood work was done today as it is all done through their office. Follow up one year, sooner as needed.   Dara Lords MD, 4:21 PM 04/13/2012

## 2012-04-13 NOTE — Patient Instructions (Addendum)
Use estrogen vaginal cream 3 times weekly. Follow up in 1 year.

## 2012-05-10 ENCOUNTER — Ambulatory Visit
Admission: RE | Admit: 2012-05-10 | Discharge: 2012-05-10 | Disposition: A | Discharge status: Home / Self Care | Payer: Medicare Other | Source: Ambulatory Visit

## 2012-05-10 DIAGNOSIS — Z1231 Encounter for screening mammogram for malignant neoplasm of breast: Secondary | ICD-10-CM

## 2012-05-14 ENCOUNTER — Other Ambulatory Visit: Payer: Self-pay | Admitting: *Deleted

## 2012-05-14 DIAGNOSIS — R928 Other abnormal and inconclusive findings on diagnostic imaging of breast: Secondary | ICD-10-CM

## 2012-05-14 DIAGNOSIS — N6310 Unspecified lump in the right breast, unspecified quadrant: Secondary | ICD-10-CM

## 2012-05-18 ENCOUNTER — Ambulatory Visit
Admission: RE | Admit: 2012-05-18 | Discharge: 2012-05-18 | Disposition: A | Discharge status: Home / Self Care | Payer: Medicare Other | Source: Ambulatory Visit | Attending: Gynecology | Admitting: Gynecology

## 2012-05-18 DIAGNOSIS — R928 Other abnormal and inconclusive findings on diagnostic imaging of breast: Secondary | ICD-10-CM

## 2012-05-18 DIAGNOSIS — N6009 Solitary cyst of unspecified breast: Secondary | ICD-10-CM | POA: Diagnosis not present

## 2012-05-18 DIAGNOSIS — N6310 Unspecified lump in the right breast, unspecified quadrant: Secondary | ICD-10-CM

## 2012-06-08 DIAGNOSIS — M1710 Unilateral primary osteoarthritis, unspecified knee: Secondary | ICD-10-CM | POA: Diagnosis not present

## 2012-06-08 DIAGNOSIS — IMO0002 Reserved for concepts with insufficient information to code with codable children: Secondary | ICD-10-CM | POA: Diagnosis not present

## 2012-07-15 DIAGNOSIS — H43399 Other vitreous opacities, unspecified eye: Secondary | ICD-10-CM | POA: Diagnosis not present

## 2012-07-20 DIAGNOSIS — H25099 Other age-related incipient cataract, unspecified eye: Secondary | ICD-10-CM | POA: Diagnosis not present

## 2012-12-22 DIAGNOSIS — E7800 Pure hypercholesterolemia, unspecified: Secondary | ICD-10-CM | POA: Diagnosis not present

## 2012-12-22 DIAGNOSIS — Z136 Encounter for screening for cardiovascular disorders: Secondary | ICD-10-CM | POA: Diagnosis not present

## 2012-12-22 DIAGNOSIS — I10 Essential (primary) hypertension: Secondary | ICD-10-CM | POA: Diagnosis not present

## 2012-12-22 DIAGNOSIS — R7301 Impaired fasting glucose: Secondary | ICD-10-CM | POA: Diagnosis not present

## 2013-06-29 DIAGNOSIS — Z6841 Body Mass Index (BMI) 40.0 and over, adult: Secondary | ICD-10-CM | POA: Diagnosis not present

## 2013-06-29 DIAGNOSIS — E7800 Pure hypercholesterolemia, unspecified: Secondary | ICD-10-CM | POA: Diagnosis not present

## 2013-06-29 DIAGNOSIS — I10 Essential (primary) hypertension: Secondary | ICD-10-CM | POA: Diagnosis not present

## 2013-06-29 DIAGNOSIS — R7309 Other abnormal glucose: Secondary | ICD-10-CM | POA: Diagnosis not present

## 2013-08-31 ENCOUNTER — Other Ambulatory Visit: Payer: Self-pay

## 2013-08-31 DIAGNOSIS — Z1231 Encounter for screening mammogram for malignant neoplasm of breast: Secondary | ICD-10-CM

## 2013-09-15 ENCOUNTER — Encounter (INDEPENDENT_AMBULATORY_CARE_PROVIDER_SITE_OTHER): Payer: Self-pay

## 2013-09-15 ENCOUNTER — Ambulatory Visit
Admission: RE | Admit: 2013-09-15 | Discharge: 2013-09-15 | Disposition: A | Discharge status: Home / Self Care | Payer: Medicare Other | Source: Ambulatory Visit

## 2013-09-15 DIAGNOSIS — Z1231 Encounter for screening mammogram for malignant neoplasm of breast: Secondary | ICD-10-CM | POA: Diagnosis not present

## 2013-10-25 DIAGNOSIS — Z23 Encounter for immunization: Secondary | ICD-10-CM | POA: Diagnosis not present

## 2013-11-07 ENCOUNTER — Encounter: Payer: Self-pay | Admitting: Gynecology

## 2013-11-23 DIAGNOSIS — H25099 Other age-related incipient cataract, unspecified eye: Secondary | ICD-10-CM | POA: Diagnosis not present

## 2014-01-19 DIAGNOSIS — R7309 Other abnormal glucose: Secondary | ICD-10-CM | POA: Diagnosis not present

## 2014-01-19 DIAGNOSIS — E780 Pure hypercholesterolemia: Secondary | ICD-10-CM | POA: Diagnosis not present

## 2014-01-19 DIAGNOSIS — I10 Essential (primary) hypertension: Secondary | ICD-10-CM | POA: Diagnosis not present

## 2014-01-19 DIAGNOSIS — K219 Gastro-esophageal reflux disease without esophagitis: Secondary | ICD-10-CM | POA: Diagnosis not present

## 2014-03-21 DIAGNOSIS — K219 Gastro-esophageal reflux disease without esophagitis: Secondary | ICD-10-CM | POA: Diagnosis not present

## 2014-03-21 DIAGNOSIS — Z1211 Encounter for screening for malignant neoplasm of colon: Secondary | ICD-10-CM | POA: Diagnosis not present

## 2014-04-05 DIAGNOSIS — K5730 Diverticulosis of large intestine without perforation or abscess without bleeding: Secondary | ICD-10-CM | POA: Diagnosis not present

## 2014-04-05 DIAGNOSIS — Z86010 Personal history of colonic polyps: Secondary | ICD-10-CM | POA: Diagnosis not present

## 2014-04-05 DIAGNOSIS — Z1211 Encounter for screening for malignant neoplasm of colon: Secondary | ICD-10-CM | POA: Diagnosis not present

## 2014-05-01 DIAGNOSIS — I10 Essential (primary) hypertension: Secondary | ICD-10-CM | POA: Diagnosis not present

## 2014-05-01 DIAGNOSIS — J329 Chronic sinusitis, unspecified: Secondary | ICD-10-CM | POA: Diagnosis not present

## 2014-07-06 ENCOUNTER — Encounter (HOSPITAL_COMMUNITY): Payer: Self-pay | Admitting: Emergency Medicine

## 2014-07-06 DIAGNOSIS — Z79899 Other long term (current) drug therapy: Secondary | ICD-10-CM | POA: Insufficient documentation

## 2014-07-06 DIAGNOSIS — I10 Essential (primary) hypertension: Secondary | ICD-10-CM | POA: Diagnosis not present

## 2014-07-06 DIAGNOSIS — H81391 Other peripheral vertigo, right ear: Secondary | ICD-10-CM | POA: Insufficient documentation

## 2014-07-06 DIAGNOSIS — Z7982 Long term (current) use of aspirin: Secondary | ICD-10-CM | POA: Insufficient documentation

## 2014-07-06 DIAGNOSIS — R110 Nausea: Secondary | ICD-10-CM | POA: Diagnosis not present

## 2014-07-06 DIAGNOSIS — R42 Dizziness and giddiness: Secondary | ICD-10-CM | POA: Diagnosis not present

## 2014-07-06 NOTE — ED Notes (Signed)
Pt. reports dizziness and nausea onset this afternoon , no emesis , denies pain / respirations unlabored.

## 2014-07-07 ENCOUNTER — Emergency Department (HOSPITAL_COMMUNITY)
Admission: EM | Admit: 2014-07-07 | Discharge: 2014-07-07 | Disposition: A | Discharge status: Home / Self Care | Payer: Medicare Other | Attending: Emergency Medicine | Admitting: Emergency Medicine

## 2014-07-07 ENCOUNTER — Emergency Department (HOSPITAL_COMMUNITY): Payer: Medicare Other

## 2014-07-07 DIAGNOSIS — R42 Dizziness and giddiness: Secondary | ICD-10-CM | POA: Diagnosis not present

## 2014-07-07 DIAGNOSIS — H81391 Other peripheral vertigo, right ear: Secondary | ICD-10-CM

## 2014-07-07 LAB — CBC WITH DIFFERENTIAL/PLATELET
BASOS ABS: 0 10*3/uL (ref 0.0–0.1)
Basophils Relative: 0 % (ref 0–1)
EOS ABS: 0.1 10*3/uL (ref 0.0–0.7)
Eosinophils Relative: 1 % (ref 0–5)
HEMATOCRIT: 38.2 % (ref 36.0–46.0)
Hemoglobin: 12.7 g/dL (ref 12.0–15.0)
Lymphocytes Relative: 18 % (ref 12–46)
Lymphs Abs: 1.6 10*3/uL (ref 0.7–4.0)
MCH: 26.5 pg (ref 26.0–34.0)
MCHC: 33.2 g/dL (ref 30.0–36.0)
MCV: 79.7 fL (ref 78.0–100.0)
MONOS PCT: 5 % (ref 3–12)
Monocytes Absolute: 0.4 10*3/uL (ref 0.1–1.0)
NEUTROS ABS: 6.7 10*3/uL (ref 1.7–7.7)
NEUTROS PCT: 76 % (ref 43–77)
PLATELETS: 278 10*3/uL (ref 150–400)
RBC: 4.79 MIL/uL (ref 3.87–5.11)
RDW: 14.9 % (ref 11.5–15.5)
WBC: 8.8 10*3/uL (ref 4.0–10.5)

## 2014-07-07 LAB — COMPREHENSIVE METABOLIC PANEL
ALBUMIN: 3.6 g/dL (ref 3.5–5.0)
ALT: 19 U/L (ref 14–54)
ANION GAP: 10 (ref 5–15)
AST: 21 U/L (ref 15–41)
Alkaline Phosphatase: 80 U/L (ref 38–126)
BUN: 12 mg/dL (ref 6–20)
CALCIUM: 9.3 mg/dL (ref 8.9–10.3)
CHLORIDE: 105 mmol/L (ref 101–111)
CO2: 22 mmol/L (ref 22–32)
CREATININE: 0.63 mg/dL (ref 0.44–1.00)
GFR calc Af Amer: >60 mL/min (ref >60)
Glucose, Bld: 183 mg/dL — ABNORMAL HIGH (ref 65–99)
Potassium: 3.3 mmol/L — ABNORMAL LOW (ref 3.5–5.1)
Sodium: 137 mmol/L (ref 135–145)
Total Bilirubin: 0.5 mg/dL (ref 0.3–1.2)
Total Protein: 6.8 g/dL (ref 6.5–8.1)

## 2014-07-07 MED ORDER — LORAZEPAM 2 MG/ML IJ SOLN
0.5000 mg | Freq: Once | INTRAMUSCULAR | Status: AC
  Administered 2014-07-07: 0.5 mg via INTRAVENOUS
  Filled 2014-07-07: qty 1

## 2014-07-07 MED ORDER — MECLIZINE HCL 25 MG PO TABS
25.0000 mg | ORAL_TABLET | Freq: Once | ORAL | Status: AC
  Administered 2014-07-07: 25 mg via ORAL
  Filled 2014-07-07: qty 1

## 2014-07-07 MED ORDER — MECLIZINE HCL 25 MG PO TABS: 25.0000 mg | ORAL_TABLET | Freq: Three times a day (TID) | ORAL | Status: DC | PRN

## 2014-07-07 MED ORDER — ONDANSETRON HCL 4 MG/2ML IJ SOLN
4.0000 mg | Freq: Once | INTRAMUSCULAR | Status: AC
  Administered 2014-07-07: 4 mg via INTRAVENOUS
  Filled 2014-07-07: qty 2

## 2014-07-07 MED ORDER — ONDANSETRON 4 MG PO TBDP: ORAL_TABLET | ORAL | Status: DC

## 2014-07-07 NOTE — ED Notes (Signed)
Patient still out at MRI.

## 2014-07-07 NOTE — Discharge Instructions (Signed)

## 2014-07-07 NOTE — ED Provider Notes (Signed)
CSN: [id number]     Arrival date & time 07/06/14  2325 History  This chart was scribed for Julianne Rice, MD by Evelene Croon, ED Scribe. This patient was seen in room B17C/B17C and the patient's care was started 12:10 AM.    Chief Complaint  Patient presents with  . Dizziness  . Nausea    The history is provided by the patient. No language interpreter was used.     HPI Comments:  Bridget [name] is a 69 y.o. female who presents to the Emergency Department complaining o fdizziness since this afternoon ~1500. Pt states her symptom has progressivley worsened since ~1830 this evening. She notes she  woke up this evening so dizzy she had difficulty standing up. She denies room spinning sensation; describes her dizziness as a "wobbly" sensation. Pt reports a similar episode in May 2016 secondary to an inner ear infection which she was placed on amoxicillin. She notes that episode was not as bad as today. Her dizziness is exacerbated by positional changes and movement of her head.  She reports associated nausea and mild discomfort in her right ear. She denies vomiting, vision changes, speech changes, abdominal pain, and CP. No alleviating factors noted.   Past Medical History  Diagnosis Date  . Hypertension   . Seasonal allergies    Past Surgical History  Procedure Laterality Date  . Vaginal hysterectomy  01/2003    [facility],RSO A&P REPAIR  . Oophorectomy  01/2003    RSO AT [facility]  . Cholecystectomy  02/2010  . Knee surgery     Family History  Problem Relation Age of Onset  . Heart disease Mother   . Diabetes Father     AODM  . Hypertension Father   . Cancer Father     LUNG  . Cancer Brother 53    BLADDER  . Breast cancer Sister 26   History  Substance Use Topics  . Smoking status: Never Smoker   . Smokeless tobacco: Never Used  . Alcohol Use: Yes     Comment: SELDOM   OB History    Gravida Para Term Preterm AB TAB SAB Ectopic Multiple Living   3 2 2  1     2      Review  of Systems  Constitutional: Negative for fever and chills.  HENT: Positive for ear pain. Negative for hearing loss.   Eyes: Negative for visual disturbance.  Respiratory: Negative for shortness of breath.   Gastrointestinal: Positive for nausea. Negative for vomiting and abdominal pain.  Musculoskeletal: Negative for back pain, neck pain and neck stiffness.  Skin: Negative for rash and wound.  Neurological: Positive for dizziness. Negative for speech difficulty, weakness, numbness and headaches.  All other systems reviewed and are negative.     Allergies  Codeine  Home Medications   Prior to Admission medications   Medication Sig Start Date End Date Taking? Authorizing Provider  aspirin 81 MG tablet Take 81 mg by mouth daily.     Yes Historical Provider, MD  benazepril-hydrochlorthiazide (LOTENSIN HCT) 20-25 MG per tablet Take 1 tablet by mouth daily. 04/30/14  Yes Historical Provider, MD  CALCIUM PO Take 1 tablet by mouth daily.    Yes Historical Provider, MD  cholecalciferol (VITAMIN D) 1000 UNITS tablet Take 1,000 Units by mouth daily.     Yes Historical Provider, MD  Multiple Vitamin (MULTIVITAMIN WITH MINERALS) TABS tablet Take 1 tablet by mouth daily.   Yes Historical Provider, MD  meclizine (ANTIVERT) 25 MG  tablet Take 1 tablet (25 mg total) by mouth 3 (three) times daily as needed for dizziness. 07/07/14   Julianne Rice, MD  ondansetron (ZOFRAN ODT) 4 MG disintegrating tablet 4mg  ODT q4 hours prn nausea/vomit 07/07/14   Julianne Rice, MD   BP 132/69 mmHg  Pulse 48  Temp(Src) 97.5 F (36.4 C)  Resp 11  SpO2 98%  LMP 01/07/2003 Physical Exam  Constitutional: She is oriented to person, place, and time. She appears well-developed and well-nourished. No distress.  HENT:  Head: Normocephalic and atraumatic.  Mouth/Throat: Oropharynx is clear and moist.  Normal bilateral TMs  Eyes: EOM are normal. Pupils are equal, round, and reactive to light.  Few beats of rotary  nystagmus  Neck: Normal range of motion. Neck supple.  Cardiovascular: Normal rate and regular rhythm.   Pulmonary/Chest: Effort normal and breath sounds normal. No respiratory distress. She has no wheezes. She has no rales.  Abdominal: Soft. Bowel sounds are normal. She exhibits no distension and no mass. There is no tenderness. There is no rebound and no guarding.  Musculoskeletal: Normal range of motion. She exhibits no edema or tenderness.  Distal pulses intact  Neurological: She is alert and oriented to person, place, and time.  Patient is alert and oriented x3 with clear, goal oriented speech. Patient has 5/5 motor in all extremities. Sensation is intact to light touch. Bilateral finger-to-nose is normal with no signs of dysmetria. Dizziness exacerbated with turning head from side to side.  Skin: Skin is warm and dry. No rash noted. No erythema.  Psychiatric: She has a normal mood and affect. Her behavior is normal.  Nursing note and vitals reviewed.   ED Course  Procedures   DIAGNOSTIC STUDIES:  Oxygen Saturation is 98% on RA, normal by my interpretation.    COORDINATION OF CARE:  12:16 AM Will order blood work and MRI. Discussed treatment plan with pt at bedside and pt agreed to plan.  2:22 AM Pt updated with results.    Labs Review Labs Reviewed  COMPREHENSIVE METABOLIC PANEL - Abnormal; Notable for the following:    Potassium 3.3 (*)    Glucose, Bld 183 (*)    All other components within normal limits  CBC WITH DIFFERENTIAL/PLATELET    Imaging Review Mr Brain Wo Contrast  07/07/2014   CLINICAL DATA:  Initial evaluation for acute onset dizziness, progressively worsening.  EXAM: MRI HEAD WITHOUT CONTRAST  TECHNIQUE: Multiplanar, multiecho pulse sequences of the brain and surrounding structures were obtained without intravenous contrast.  COMPARISON:  None.  FINDINGS: Mild age-related cerebral atrophy present. Patchy and confluent T2/FLAIR hyperintensity within the  periventricular and deep white matter both cerebral hemispheres present, most consistent with very mild chronic small vessel ischemic disease.  No abnormal foci of restricted diffusion to suggest acute intracranial infarct. Gray-white matter differentiation maintained. Normal intravascular flow voids are preserved. No acute or chronic intracranial hemorrhage.  No mass lesion or midline shift. No mass effect. No hydrocephalus. No extra-axial fluid collection.  Craniocervical junction within normal limits. Pituitary gland normal. No acute abnormality about the orbits.  Paranasal sinuses and mastoid air cells are clear. Inner ear structures within normal limits.  Bone marrow signal intensity within normal limits. Minimal degenerative change stent within the visualized upper cervical spine. No scalp soft tissue abnormality.  IMPRESSION: 1. No acute intracranial infarct or other abnormality identified. 2. Mild age-related cerebral atrophy and chronic microvascular ischemic disease.   Electronically Signed   By: Jeannine Boga M.D.   On:  07/07/2014 02:03     EKG Interpretation   Date/Time:  Friday July 07 2014 01:56:02 EDT Ventricular Rate:  45 PR Interval:  176 QRS Duration: 96 QT Interval:  521 QTC Calculation: 451 R Axis:   51 Text Interpretation:  Sinus bradycardia Confirmed by Lita Mains  MD, Brittania Sudbeck  ([id number]) on 07/07/2014 4:05:16 AM      MDM   Final diagnoses:  Peripheral vertigo involving right ear    I personally performed the services described in this documentation, which was scribed in my presence. The recorded information has been reviewed and is accurate.   MRI with no acute findings. Laboratory workup is essentially negative. Patient states she is feeling better after Zofran and Ativan. We'll attempt to ambulate in the emergency department. Suspect peripheral cause for the patient's vertigo.  Patient's symptoms have significantly improved. Ambulating in the hallways. Mild  dizziness. We'll discharge home to follow-up with ENT. Return precautions given.  Julianne Rice, MD 07/07/14 [phone]

## 2014-07-24 DIAGNOSIS — Z974 Presence of external hearing-aid: Secondary | ICD-10-CM | POA: Diagnosis not present

## 2014-07-24 DIAGNOSIS — R42 Dizziness and giddiness: Secondary | ICD-10-CM | POA: Diagnosis not present

## 2014-07-24 DIAGNOSIS — H9113 Presbycusis, bilateral: Secondary | ICD-10-CM | POA: Diagnosis not present

## 2014-07-24 DIAGNOSIS — H903 Sensorineural hearing loss, bilateral: Secondary | ICD-10-CM | POA: Diagnosis not present

## 2014-07-26 DIAGNOSIS — E780 Pure hypercholesterolemia: Secondary | ICD-10-CM | POA: Diagnosis not present

## 2014-07-26 DIAGNOSIS — R7309 Other abnormal glucose: Secondary | ICD-10-CM | POA: Diagnosis not present

## 2014-07-26 DIAGNOSIS — Z1389 Encounter for screening for other disorder: Secondary | ICD-10-CM | POA: Diagnosis not present

## 2014-07-26 DIAGNOSIS — I10 Essential (primary) hypertension: Secondary | ICD-10-CM | POA: Diagnosis not present

## 2014-08-24 DIAGNOSIS — J209 Acute bronchitis, unspecified: Secondary | ICD-10-CM | POA: Diagnosis not present

## 2014-09-01 DIAGNOSIS — I10 Essential (primary) hypertension: Secondary | ICD-10-CM | POA: Diagnosis not present

## 2014-09-01 DIAGNOSIS — H25099 Other age-related incipient cataract, unspecified eye: Secondary | ICD-10-CM | POA: Diagnosis not present

## 2014-11-11 DIAGNOSIS — Z23 Encounter for immunization: Secondary | ICD-10-CM | POA: Diagnosis not present

## 2015-01-10 ENCOUNTER — Other Ambulatory Visit: Payer: Self-pay

## 2015-01-10 DIAGNOSIS — Z1231 Encounter for screening mammogram for malignant neoplasm of breast: Secondary | ICD-10-CM

## 2015-01-31 ENCOUNTER — Ambulatory Visit
Admission: RE | Admit: 2015-01-31 | Discharge: 2015-01-31 | Disposition: A | Discharge status: Home / Self Care | Payer: Medicare Other | Source: Ambulatory Visit

## 2015-01-31 DIAGNOSIS — Z1231 Encounter for screening mammogram for malignant neoplasm of breast: Secondary | ICD-10-CM | POA: Diagnosis not present

## 2015-01-31 DIAGNOSIS — R7303 Prediabetes: Secondary | ICD-10-CM | POA: Diagnosis not present

## 2015-01-31 DIAGNOSIS — J309 Allergic rhinitis, unspecified: Secondary | ICD-10-CM | POA: Diagnosis not present

## 2015-01-31 DIAGNOSIS — I10 Essential (primary) hypertension: Secondary | ICD-10-CM | POA: Diagnosis not present

## 2015-01-31 DIAGNOSIS — R7309 Other abnormal glucose: Secondary | ICD-10-CM | POA: Diagnosis not present

## 2015-01-31 DIAGNOSIS — E7800 Pure hypercholesterolemia, unspecified: Secondary | ICD-10-CM | POA: Diagnosis not present

## 2015-03-26 DIAGNOSIS — H9201 Otalgia, right ear: Secondary | ICD-10-CM | POA: Diagnosis not present

## 2015-04-04 DIAGNOSIS — H6981 Other specified disorders of Eustachian tube, right ear: Secondary | ICD-10-CM | POA: Diagnosis not present

## 2015-04-04 DIAGNOSIS — Z974 Presence of external hearing-aid: Secondary | ICD-10-CM | POA: Diagnosis not present

## 2015-04-04 DIAGNOSIS — H903 Sensorineural hearing loss, bilateral: Secondary | ICD-10-CM | POA: Diagnosis not present

## 2015-04-04 DIAGNOSIS — R42 Dizziness and giddiness: Secondary | ICD-10-CM | POA: Diagnosis not present

## 2015-07-28 DIAGNOSIS — R05 Cough: Secondary | ICD-10-CM | POA: Diagnosis not present

## 2015-07-28 DIAGNOSIS — J069 Acute upper respiratory infection, unspecified: Secondary | ICD-10-CM | POA: Diagnosis not present

## 2015-08-09 DIAGNOSIS — I10 Essential (primary) hypertension: Secondary | ICD-10-CM | POA: Diagnosis not present

## 2015-08-09 DIAGNOSIS — R7303 Prediabetes: Secondary | ICD-10-CM | POA: Diagnosis not present

## 2015-08-09 DIAGNOSIS — R7309 Other abnormal glucose: Secondary | ICD-10-CM | POA: Diagnosis not present

## 2015-08-09 DIAGNOSIS — E7800 Pure hypercholesterolemia, unspecified: Secondary | ICD-10-CM | POA: Diagnosis not present

## 2015-09-25 ENCOUNTER — Other Ambulatory Visit: Payer: Self-pay | Admitting: Physician Assistant

## 2015-09-25 DIAGNOSIS — L570 Actinic keratosis: Secondary | ICD-10-CM | POA: Diagnosis not present

## 2015-09-25 DIAGNOSIS — C44619 Basal cell carcinoma of skin of left upper limb, including shoulder: Secondary | ICD-10-CM | POA: Diagnosis not present

## 2015-09-25 DIAGNOSIS — D239 Other benign neoplasm of skin, unspecified: Secondary | ICD-10-CM | POA: Diagnosis not present

## 2015-11-01 DIAGNOSIS — Z23 Encounter for immunization: Secondary | ICD-10-CM | POA: Diagnosis not present

## 2015-11-09 DIAGNOSIS — H25099 Other age-related incipient cataract, unspecified eye: Secondary | ICD-10-CM | POA: Diagnosis not present

## 2015-11-09 DIAGNOSIS — C44619 Basal cell carcinoma of skin of left upper limb, including shoulder: Secondary | ICD-10-CM | POA: Diagnosis not present

## 2015-11-09 DIAGNOSIS — I10 Essential (primary) hypertension: Secondary | ICD-10-CM | POA: Diagnosis not present

## 2016-02-11 DIAGNOSIS — J209 Acute bronchitis, unspecified: Secondary | ICD-10-CM | POA: Diagnosis not present

## 2016-03-03 ENCOUNTER — Other Ambulatory Visit: Payer: Self-pay | Admitting: Family Medicine

## 2016-03-03 DIAGNOSIS — E7800 Pure hypercholesterolemia, unspecified: Secondary | ICD-10-CM | POA: Diagnosis not present

## 2016-03-03 DIAGNOSIS — R7309 Other abnormal glucose: Secondary | ICD-10-CM | POA: Diagnosis not present

## 2016-03-03 DIAGNOSIS — Z1231 Encounter for screening mammogram for malignant neoplasm of breast: Secondary | ICD-10-CM

## 2016-03-03 DIAGNOSIS — R7303 Prediabetes: Secondary | ICD-10-CM | POA: Diagnosis not present

## 2016-03-03 DIAGNOSIS — I10 Essential (primary) hypertension: Secondary | ICD-10-CM | POA: Diagnosis not present

## 2016-03-19 ENCOUNTER — Ambulatory Visit
Admission: RE | Admit: 2016-03-19 | Discharge: 2016-03-19 | Disposition: A | Discharge status: Home / Self Care | Payer: Medicare Other | Source: Ambulatory Visit | Attending: Family Medicine | Admitting: Family Medicine

## 2016-03-19 DIAGNOSIS — Z1231 Encounter for screening mammogram for malignant neoplasm of breast: Secondary | ICD-10-CM | POA: Diagnosis not present

## 2016-03-20 DIAGNOSIS — I10 Essential (primary) hypertension: Secondary | ICD-10-CM | POA: Diagnosis not present

## 2016-04-09 DIAGNOSIS — Z6841 Body Mass Index (BMI) 40.0 and over, adult: Secondary | ICD-10-CM | POA: Diagnosis not present

## 2016-04-09 DIAGNOSIS — I10 Essential (primary) hypertension: Secondary | ICD-10-CM | POA: Diagnosis not present

## 2016-04-23 DIAGNOSIS — R399 Unspecified symptoms and signs involving the genitourinary system: Secondary | ICD-10-CM | POA: Diagnosis not present

## 2016-06-04 IMAGING — MR MR HEAD W/O CM
9 of 10 series · 36 of 48 positions shown · non-contrast
Comparison: None.

CLINICAL DATA: Initial evaluation for acute onset dizziness,
progressively worsening.

EXAM:
MRI HEAD WITHOUT CONTRAST
TECHNIQUE: Multiplanar, multiecho pulse sequences of the brain and surrounding
structures were obtained without intravenous contrast.

[Series 3: DWI · axial · 3.0mm · 1.09mm/px · z∈[-139,-18]mm · 9 of 84 slices shown (1 of 4)]
[im 1/84]
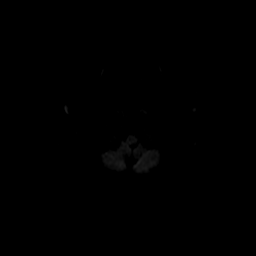
[im 11/84]
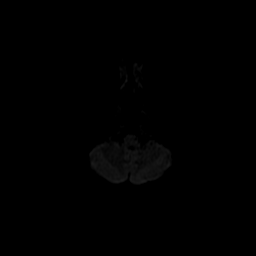
[im 21/84]
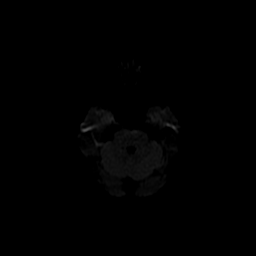
[im 32/84]
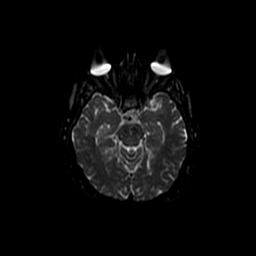
[im 42/84]
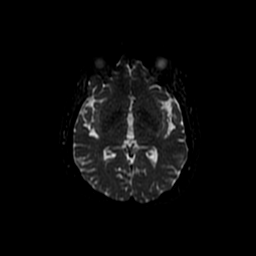
[im 52/84]
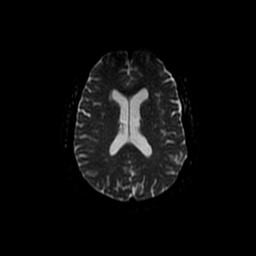
[im 63/84]
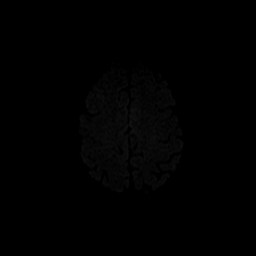
[im 73/84]
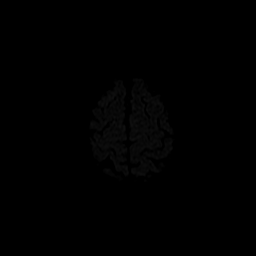
[im 84/84]
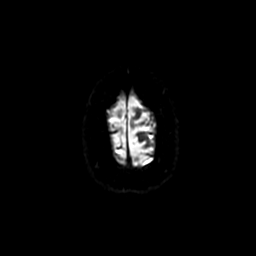

[Series 4: T1 · sagittal · 5.0mm · 0.47mm/px · 2 of 23 slices shown]
[im 1/23]
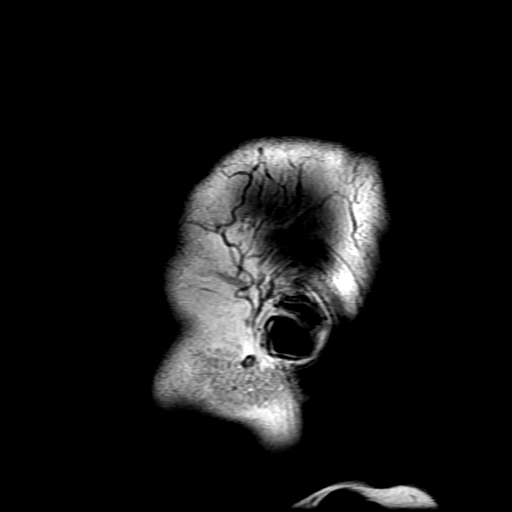
[im 23/23]
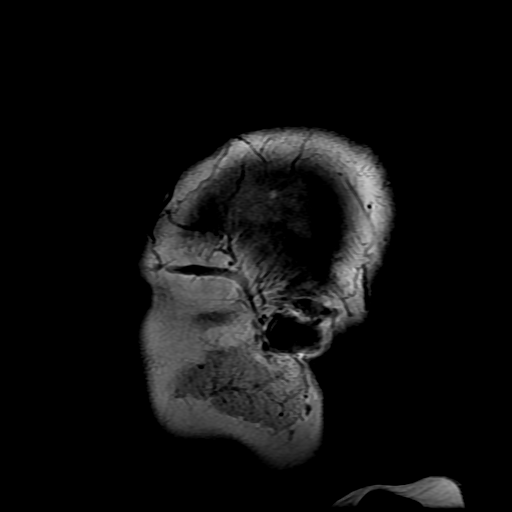

[Series 5: DWI · coronal · 5.0mm · 1.09mm/px · 7 of 66 slices shown (2 of 4)]
[im 1/66]
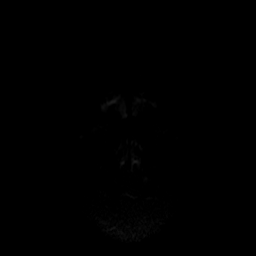
[im 11/66]
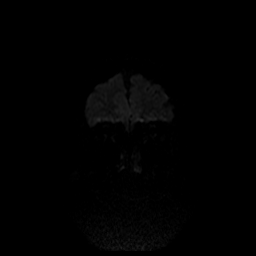
[im 22/66]
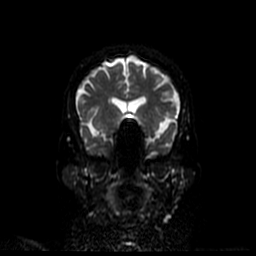
[im 33/66]
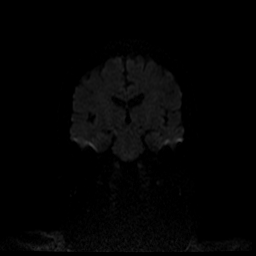
[im 44/66]
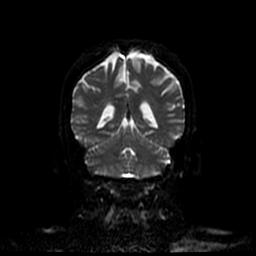
[im 55/66]
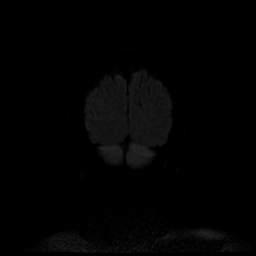
[im 66/66]
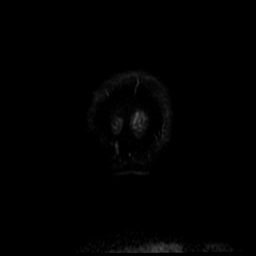

[Series 6: T2 · axial · 5.0mm · 0.43mm/px · z∈[-148,-6]mm · 3 of 25 slices shown (1 of 2)]
[im 1/25]
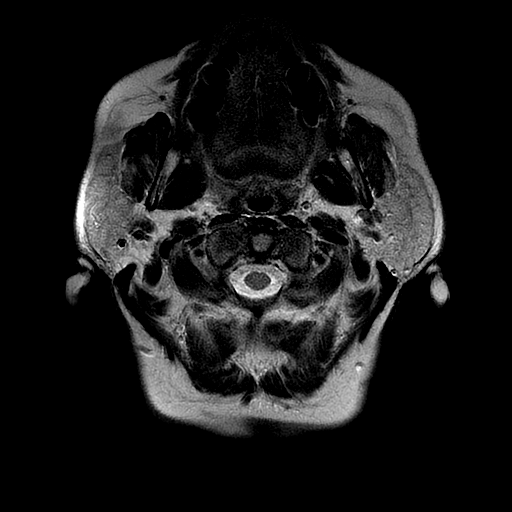
[im 13/25]
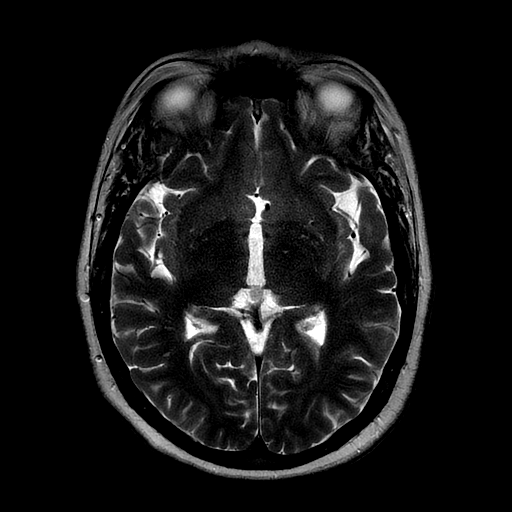
[im 25/25]
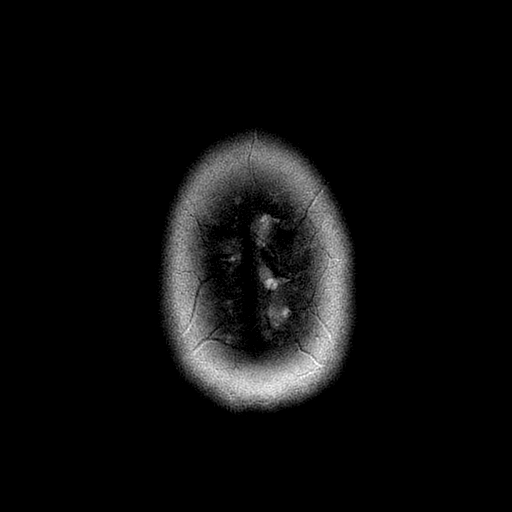

[Series 7: FLAIR · axial · 5.0mm · 0.43mm/px · z∈[-148,-6]mm · 3 of 25 slices shown]
[im 1/25]
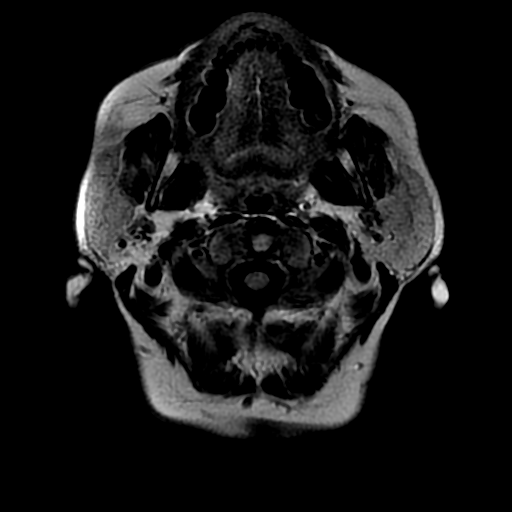
[im 13/25]
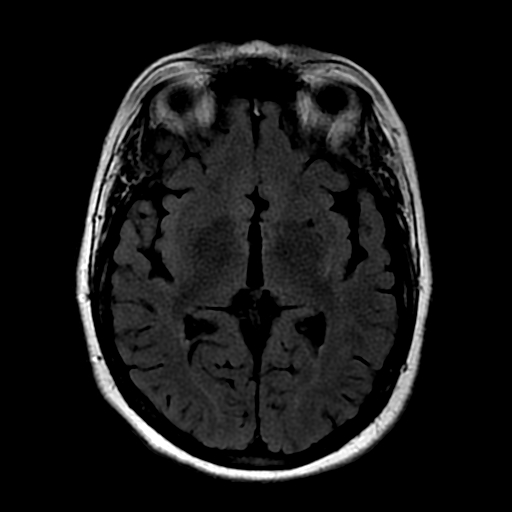
[im 25/25]
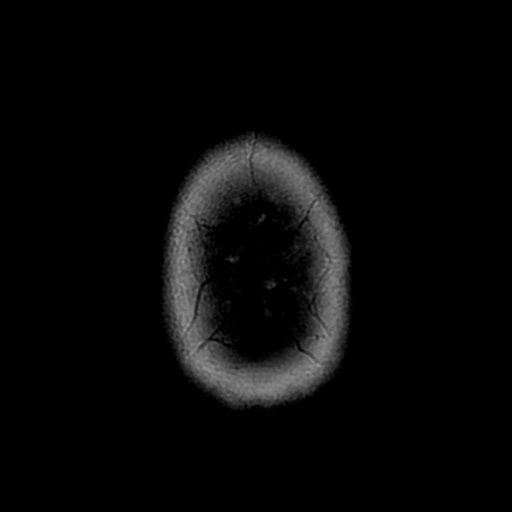

[Series 8: ax mpgr · axial · 5.0mm · 0.43mm/px · 1 of 25 slices shown]
[im 1/25]
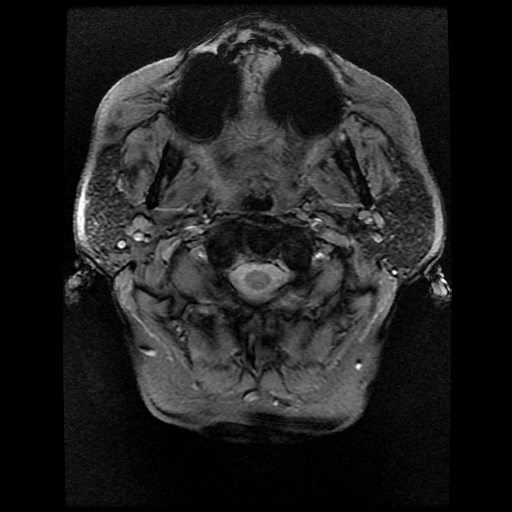

[Series 10: T2 · coronal · 5.0mm · 0.43mm/px · 3 of 29 slices shown (2 of 2)]
[im 1/29]
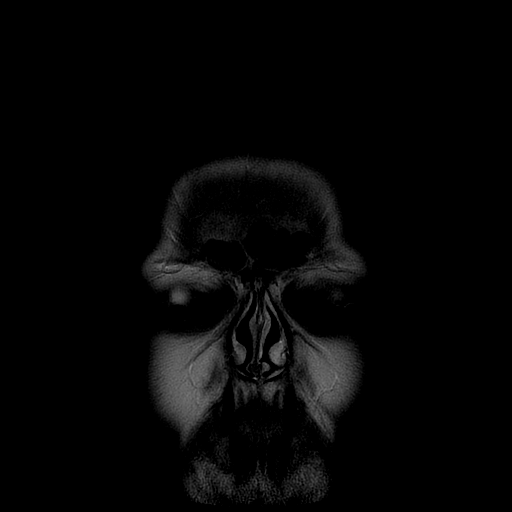
[im 15/29]
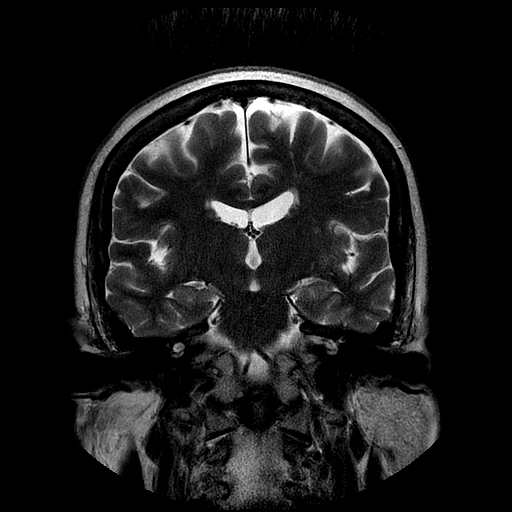
[im 29/29]
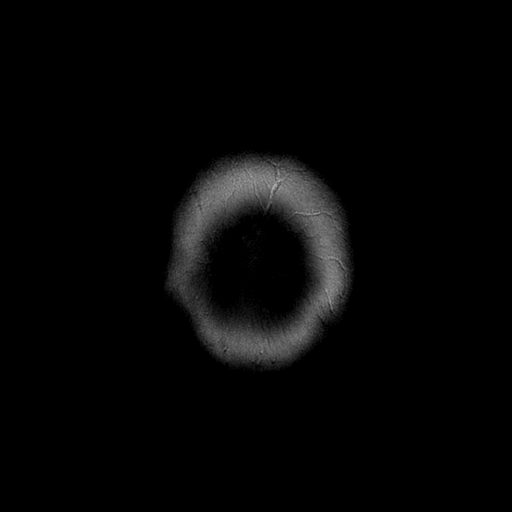

[Series 300: DWI · axial · 3.0mm · 1.09mm/px · z∈[-139,-18]mm · 4 of 42 slices shown (3 of 4)]
[im 1/42]
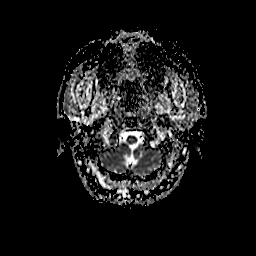
[im 14/42]
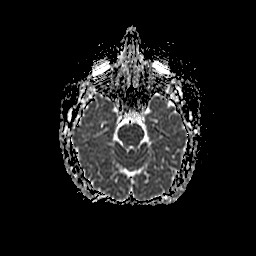
[im 28/42]
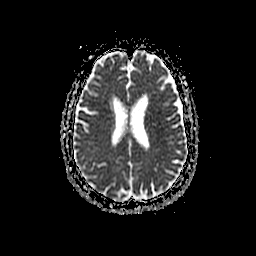
[im 42/42]
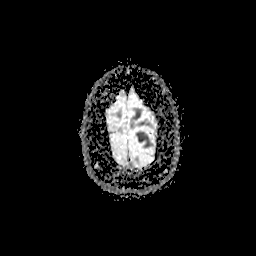

[Series 500: DWI · coronal · 5.0mm · 1.09mm/px · 4 of 33 slices shown (4 of 4)]
[im 1/33]
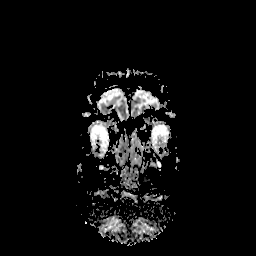
[im 11/33]
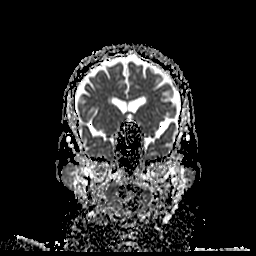
[im 22/33]
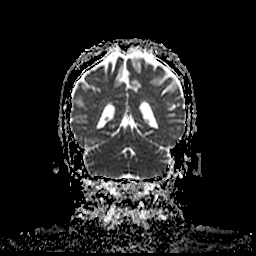
[im 33/33]
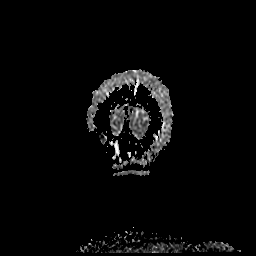

[36 of 48 positions shown; findings below may reference images not displayed]

FINDINGS: Mild age-related cerebral atrophy present. Patchy and confluent
T2/FLAIR hyperintensity within the periventricular and deep white
matter both cerebral hemispheres present, most consistent with very
mild chronic small vessel ischemic disease.

No abnormal foci of restricted diffusion to suggest acute
intracranial infarct. Gray-white matter differentiation maintained.
Normal intravascular flow voids are preserved. No acute or chronic
intracranial hemorrhage.

No mass lesion or midline shift. No mass effect. No hydrocephalus.
No extra-axial fluid collection.

Craniocervical junction within normal limits. Pituitary gland
normal. No acute abnormality about the orbits.

Paranasal sinuses and mastoid air cells are clear. Inner ear
structures within normal limits.

Bone marrow signal intensity within normal limits. Minimal
degenerative change stent within the visualized upper cervical
spine. No scalp soft tissue abnormality.
IMPRESSION: 1. No acute intracranial infarct or other abnormality identified.
2. Mild age-related cerebral atrophy and chronic microvascular
ischemic disease.

## 2016-09-17 DIAGNOSIS — I10 Essential (primary) hypertension: Secondary | ICD-10-CM | POA: Diagnosis not present

## 2016-09-17 DIAGNOSIS — E7800 Pure hypercholesterolemia, unspecified: Secondary | ICD-10-CM | POA: Diagnosis not present

## 2016-09-17 DIAGNOSIS — J309 Allergic rhinitis, unspecified: Secondary | ICD-10-CM | POA: Diagnosis not present

## 2016-09-17 DIAGNOSIS — Z1159 Encounter for screening for other viral diseases: Secondary | ICD-10-CM | POA: Diagnosis not present

## 2016-09-17 DIAGNOSIS — Z23 Encounter for immunization: Secondary | ICD-10-CM | POA: Diagnosis not present

## 2016-09-17 DIAGNOSIS — R7303 Prediabetes: Secondary | ICD-10-CM | POA: Diagnosis not present

## 2016-09-17 DIAGNOSIS — R0683 Snoring: Secondary | ICD-10-CM | POA: Diagnosis not present

## 2016-09-17 DIAGNOSIS — K219 Gastro-esophageal reflux disease without esophagitis: Secondary | ICD-10-CM | POA: Diagnosis not present

## 2016-09-17 DIAGNOSIS — Z0000 Encounter for general adult medical examination without abnormal findings: Secondary | ICD-10-CM | POA: Diagnosis not present

## 2016-10-22 DIAGNOSIS — D229 Melanocytic nevi, unspecified: Secondary | ICD-10-CM | POA: Diagnosis not present

## 2016-10-22 DIAGNOSIS — L659 Nonscarring hair loss, unspecified: Secondary | ICD-10-CM | POA: Diagnosis not present

## 2016-10-22 DIAGNOSIS — L821 Other seborrheic keratosis: Secondary | ICD-10-CM | POA: Diagnosis not present

## 2016-10-29 DIAGNOSIS — Z23 Encounter for immunization: Secondary | ICD-10-CM | POA: Diagnosis not present

## 2016-12-08 DIAGNOSIS — Z23 Encounter for immunization: Secondary | ICD-10-CM | POA: Diagnosis not present

## 2016-12-10 DIAGNOSIS — H25099 Other age-related incipient cataract, unspecified eye: Secondary | ICD-10-CM | POA: Diagnosis not present

## 2016-12-10 DIAGNOSIS — H5203 Hypermetropia, bilateral: Secondary | ICD-10-CM | POA: Diagnosis not present

## 2016-12-23 DIAGNOSIS — R350 Frequency of micturition: Secondary | ICD-10-CM | POA: Diagnosis not present

## 2017-03-02 ENCOUNTER — Other Ambulatory Visit: Payer: Self-pay | Admitting: Family Medicine

## 2017-03-02 DIAGNOSIS — Z139 Encounter for screening, unspecified: Secondary | ICD-10-CM

## 2017-03-19 DIAGNOSIS — M25762 Osteophyte, left knee: Secondary | ICD-10-CM | POA: Diagnosis not present

## 2017-03-19 DIAGNOSIS — M1711 Unilateral primary osteoarthritis, right knee: Secondary | ICD-10-CM | POA: Diagnosis not present

## 2017-03-19 DIAGNOSIS — M1712 Unilateral primary osteoarthritis, left knee: Secondary | ICD-10-CM | POA: Diagnosis not present

## 2017-03-20 ENCOUNTER — Ambulatory Visit
Admission: RE | Admit: 2017-03-20 | Discharge: 2017-03-20 | Disposition: A | Discharge status: Home / Self Care | Payer: Medicare Other | Source: Ambulatory Visit | Attending: Family Medicine | Admitting: Family Medicine

## 2017-03-20 DIAGNOSIS — Z139 Encounter for screening, unspecified: Secondary | ICD-10-CM

## 2017-03-20 DIAGNOSIS — Z1231 Encounter for screening mammogram for malignant neoplasm of breast: Secondary | ICD-10-CM | POA: Diagnosis not present

## 2017-03-23 DIAGNOSIS — E7800 Pure hypercholesterolemia, unspecified: Secondary | ICD-10-CM | POA: Diagnosis not present

## 2017-03-23 DIAGNOSIS — R7303 Prediabetes: Secondary | ICD-10-CM | POA: Diagnosis not present

## 2017-03-23 DIAGNOSIS — R739 Hyperglycemia, unspecified: Secondary | ICD-10-CM | POA: Diagnosis not present

## 2017-03-23 DIAGNOSIS — I10 Essential (primary) hypertension: Secondary | ICD-10-CM | POA: Diagnosis not present

## 2017-04-06 DIAGNOSIS — J209 Acute bronchitis, unspecified: Secondary | ICD-10-CM | POA: Diagnosis not present

## 2017-04-12 DIAGNOSIS — J209 Acute bronchitis, unspecified: Secondary | ICD-10-CM | POA: Diagnosis not present

## 2017-07-06 DIAGNOSIS — M79602 Pain in left arm: Secondary | ICD-10-CM | POA: Diagnosis not present

## 2017-09-23 DIAGNOSIS — M7522 Bicipital tendinitis, left shoulder: Secondary | ICD-10-CM | POA: Diagnosis not present

## 2017-09-23 DIAGNOSIS — E2839 Other primary ovarian failure: Secondary | ICD-10-CM | POA: Diagnosis not present

## 2017-09-23 DIAGNOSIS — Z0000 Encounter for general adult medical examination without abnormal findings: Secondary | ICD-10-CM | POA: Diagnosis not present

## 2017-09-23 DIAGNOSIS — K219 Gastro-esophageal reflux disease without esophagitis: Secondary | ICD-10-CM | POA: Diagnosis not present

## 2017-09-23 DIAGNOSIS — E7800 Pure hypercholesterolemia, unspecified: Secondary | ICD-10-CM | POA: Diagnosis not present

## 2017-09-23 DIAGNOSIS — I10 Essential (primary) hypertension: Secondary | ICD-10-CM | POA: Diagnosis not present

## 2017-09-23 DIAGNOSIS — R7309 Other abnormal glucose: Secondary | ICD-10-CM | POA: Diagnosis not present

## 2017-09-30 ENCOUNTER — Other Ambulatory Visit: Payer: Self-pay | Admitting: Family Medicine

## 2017-09-30 DIAGNOSIS — M6281 Muscle weakness (generalized): Secondary | ICD-10-CM | POA: Diagnosis not present

## 2017-09-30 DIAGNOSIS — E2839 Other primary ovarian failure: Secondary | ICD-10-CM

## 2017-09-30 DIAGNOSIS — M7542 Impingement syndrome of left shoulder: Secondary | ICD-10-CM | POA: Diagnosis not present

## 2017-11-24 ENCOUNTER — Ambulatory Visit
Admission: RE | Admit: 2017-11-24 | Discharge: 2017-11-24 | Disposition: A | Discharge status: Home / Self Care | Payer: Medicare Other | Source: Ambulatory Visit | Attending: Family Medicine | Admitting: Family Medicine

## 2017-11-24 DIAGNOSIS — E2839 Other primary ovarian failure: Secondary | ICD-10-CM

## 2017-11-24 DIAGNOSIS — M8589 Other specified disorders of bone density and structure, multiple sites: Secondary | ICD-10-CM | POA: Diagnosis not present

## 2017-11-24 DIAGNOSIS — Z780 Asymptomatic menopausal state: Secondary | ICD-10-CM | POA: Diagnosis not present

## 2018-02-02 DIAGNOSIS — I10 Essential (primary) hypertension: Secondary | ICD-10-CM | POA: Diagnosis not present

## 2018-02-02 DIAGNOSIS — H5203 Hypermetropia, bilateral: Secondary | ICD-10-CM | POA: Diagnosis not present

## 2018-02-02 DIAGNOSIS — H52223 Regular astigmatism, bilateral: Secondary | ICD-10-CM | POA: Diagnosis not present

## 2018-02-02 DIAGNOSIS — H25099 Other age-related incipient cataract, unspecified eye: Secondary | ICD-10-CM | POA: Diagnosis not present

## 2018-02-02 DIAGNOSIS — H524 Presbyopia: Secondary | ICD-10-CM | POA: Diagnosis not present

## 2018-02-03 DIAGNOSIS — M25512 Pain in left shoulder: Secondary | ICD-10-CM | POA: Diagnosis not present

## 2018-04-26 ENCOUNTER — Encounter: Payer: Self-pay | Admitting: Family Medicine

## 2018-04-26 ENCOUNTER — Other Ambulatory Visit: Payer: Self-pay

## 2018-04-26 ENCOUNTER — Ambulatory Visit (INDEPENDENT_AMBULATORY_CARE_PROVIDER_SITE_OTHER): Payer: Medicare Other | Admitting: Family Medicine

## 2018-04-26 ENCOUNTER — Ambulatory Visit: Payer: Self-pay

## 2018-04-26 VITALS — BP 112/62 | HR 68 | Ht 64.0 in

## 2018-04-26 DIAGNOSIS — G8929 Other chronic pain: Secondary | ICD-10-CM

## 2018-04-26 DIAGNOSIS — M25512 Pain in left shoulder: Secondary | ICD-10-CM | POA: Diagnosis not present

## 2018-04-26 DIAGNOSIS — M75112 Incomplete rotator cuff tear or rupture of left shoulder, not specified as traumatic: Secondary | ICD-10-CM

## 2018-04-26 DIAGNOSIS — M19012 Primary osteoarthritis, left shoulder: Secondary | ICD-10-CM | POA: Insufficient documentation

## 2018-04-26 MED ORDER — GABAPENTIN 100 MG PO CAPS: 200.0000 mg | ORAL_CAPSULE | Freq: Every day | ORAL | 3 refills | Status: DC

## 2018-04-26 NOTE — Patient Instructions (Addendum)
Very nice to meet you  Ice 20 minutes 2 times daily. Usually after activity and before bed. Keep hands within peripheral vision  Exercises 3 times a week.  Injected the [facility] joint today  Over the counter consider  Turmeric 500mg  daily  Tart cherry extract any dose at night See me again in 4-5 weeks Be safe!

## 2018-04-26 NOTE — Assessment & Plan Note (Signed)
Patient given injection and tolerated the procedure well.  We discussed icing regimen and home exercise.  We discussed which activities to do which wants to avoid.  Patient will increase activity slowly over the course the next several days.  Follow-up again 4 to 5 weeks

## 2018-04-26 NOTE — Assessment & Plan Note (Signed)
Partial tear noted.  Discussed with patient that this could be contributing as well.  Home exercise program was given.  Patient has been doing this including home exercises and formal physical therapy for quite some time.  Patient's if not improving will consider an MRI.  We will hold on this at the moment secondary to the corona outbreak

## 2018-04-26 NOTE — Progress Notes (Signed)
[name] [place], [place] [address] Phone: [phone] Subjective:   I [name] am serving as a Education administrator for Dr. Hulan Saas.   CC: left shoulder pain   TDV:[id number]  Bridget [name] is a 73 y.o. female coming in with complaint of left shoulder pain. Has received 2 injections for her shoulder. Limited ROM.   Onset- Last September Location- Posterior pain that radiates anterior Duration-  Character- Achy, sharp with moving  Aggravating factors- flexion and extension  Reliving factors-  Therapies tried- topical  Severity-9 out of 10 and waking her up at night.  Patient has seen another provider and has had 2 steroid injections with no significant improvement.  Patient states that if anything seems to be worsening over the course of time.     Past Medical History:  Diagnosis Date  . Hypertension   . Seasonal allergies    Past Surgical History:  Procedure Laterality Date  . BREAST EXCISIONAL BIOPSY Left   . CHOLECYSTECTOMY  02/2010  . KNEE SURGERY    . OOPHORECTOMY  01/2003   RSO AT [facility]  . VAGINAL HYSTERECTOMY  01/2003   [facility] A&P REPAIR   Social History   Socioeconomic History  . Marital status: Married    Spouse name: Not on file  . Number of children: Not on file  . Years of education: Not on file  . Highest education level: Not on file  Occupational History  . Not on file  Social Needs  . Financial resource strain: Not on file  . Food insecurity:    Worry: Not on file    Inability: Not on file  . Transportation needs:    Medical: Not on file    Non-medical: Not on file  Tobacco Use  . Smoking status: Never Smoker  . Smokeless tobacco: Never Used  Substance and Sexual Activity  . Alcohol use: Yes    Comment: SELDOM  . Drug use: No  . Sexual activity: Not on file    Comment: HYST  Lifestyle  . Physical activity:    Days per week: Not on file    Minutes per session: Not on file  . Stress: Not  on file  Relationships  . Social connections:    Talks on phone: Not on file    Gets together: Not on file    Attends religious service: Not on file    Active member of club or organization: Not on file    Attends meetings of clubs or organizations: Not on file    Relationship status: Not on file  Other Topics Concern  . Not on file  Social History Narrative  . Not on file   Allergies  Allergen Reactions  . Codeine Nausea Only   Family History  Problem Relation Age of Onset  . Heart disease Mother   . Diabetes Father        AODM  . Hypertension Father   . Cancer Father        LUNG  . Cancer Brother 49       BLADDER  . Breast cancer Sister 86     Current Outpatient Medications (Cardiovascular):  .  benazepril-hydrochlorthiazide (LOTENSIN HCT) 20-25 MG per tablet, Take 1 tablet by mouth daily.   Current Outpatient Medications (Analgesics):  .  aspirin 81 MG tablet, Take 81 mg by mouth daily.     Current Outpatient Medications (Other):  Marland Kitchen  CALCIUM PO, Take 1 tablet by  mouth daily.  .  cholecalciferol (VITAMIN D) 1000 UNITS tablet, Take 1,000 Units by mouth daily.   .  meclizine (ANTIVERT) 25 MG tablet, Take 1 tablet (25 mg total) by mouth 3 (three) times daily as needed for dizziness. .  Multiple Vitamin (MULTIVITAMIN WITH MINERALS) TABS tablet, Take 1 tablet by mouth daily. .  ondansetron (ZOFRAN ODT) 4 MG disintegrating tablet, 4mg  ODT q4 hours prn nausea/vomit    Past medical history, social, surgical and family history all reviewed in electronic medical record.  No pertanent information unless stated regarding to the chief complaint.   Review of Systems:  No headache, visual changes, nausea, vomiting, diarrhea, constipation, dizziness, abdominal pain, skin rash, fevers, chills, night sweats, weight loss, swollen lymph nodes, body aches, joint swelling, muscle aches, chest pain, shortness of breath, mood changes.   Objective  Last menstrual period 01/07/2003.     General: No apparent distress alert and oriented x3 mood and affect normal, dressed appropriately.  HEENT: Pupils equal, extraocular movements intact  Respiratory: Patient's speak in full sentences and does not appear short of breath  Cardiovascular: No lower extremity edema, non tender, no erythema  Skin: Warm dry intact with no signs of infection or rash on extremities or on axial skeleton.  Abdomen: Soft nontender  Neuro: Cranial nerves II through XII are intact, neurovascularly intact in all extremities with 2+ DTRs and 2+ pulses.  Lymph: No lymphadenopathy of posterior or anterior cervical chain or axillae bilaterally.  Gait normal with good balance and coordination.  MSK:  Non tender with full range of motion and good stability and symmetric strength and tone of  elbows, wrist, hip, knee and ankles bilaterally.  Shoulder: left Inspection reveals no abnormalities, atrophy or asymmetry. Moderate to severe tenderness over the acromioclavicular joint Motion forward flexion actively to 85 degrees Rotator cuff strength normal throughout. signs of impingement with positive Neer and Hawkin's tests, but negative empty can sign. Speeds and Yergason's tests normal. Positive crossover Normal scapular function observed. No painful arc and no drop arm sign. No apprehension sign Anterolateral shoulder unremarkable  MSK US performed of: left This study was ordered, performed, and interpreted by [name] D.O.  Shoulder:   Supraspinatus: Partial thickness tear noted. Infraspinatus:  Appears normal on long and transverse views. Significant increase in Doppler flow Subscapularis: Marginal tear noted AC joint: Severe arthritis with capsular distention Glenohumeral Joint:  Appears normal without effusion. Glenoid Labrum: Hypoechoic changes noted.  Impression: Partial tear noted of the supraspinatus and subscapularis with acromioclavicular arthritis  Procedure: Real-time Ultrasound Guided  Injection of left acromioclavicular joint Device: GE Logiq E  Ultrasound guided injection is preferred based studies that show increased duration, increased effect, greater accuracy, decreased procedural pain, increased response rate with ultrasound guided versus blind injection.  Verbal informed consent obtained.  Time-out conducted.  Noted no overlying erythema, induration, or other signs of local infection.  Skin prepped in a sterile fashion.  Local anesthesia: Topical Ethyl chloride.  With sterile technique and under real time ultrasound guidance:  Joint visualized.  With a 25-gauge half inch needle injected with 0.5 cc of 0.5% Marcaine and 0.5 cc of Kenalog 40 mg/mL into the acromioclavicular joint Completed without difficulty  Pain immediately resolved suggesting accurate placement of the medication.  Advised to call if fevers/chills, erythema, induration, drainage, or persistent bleeding.  Images permanently stored and available for review in the ultrasound unit.  Impression: Technically successful ultrasound guided injection.  [id number]; 15 additional minutes spent for Therapeutic exercises  as stated in above notes.  This included exercises focusing on stretching, strengthening, with significant focus on eccentric aspects.   Long term goals include an improvement in range of motion, strength, endurance as well as avoiding reinjury. Patient's frequency would include in 1-2 times a day, 3-5 times a week for a duration of 6-12 weeks. Shoulder Exercises that included:  Basic scapular stabilization to include adduction and depression of scapula Scaption, focusing on proper movement and good control Internal and External rotation utilizing a theraband, with elbow tucked at side entire time Rows with theraband which was given   Proper technique shown and discussed handout in great detail with ATC.  All questions were discussed and answered.     Impression and Recommendations:     This case  required medical decision making of moderate complexity. The above documentation has been reviewed and is accurate and complete Lyndal Pulley, DO       Note: This dictation was prepared with Dragon dictation along with smaller phrase technology. Any transcriptional errors that result from this process are unintentional.

## 2018-05-06 DIAGNOSIS — R7303 Prediabetes: Secondary | ICD-10-CM | POA: Diagnosis not present

## 2018-05-11 ENCOUNTER — Emergency Department (HOSPITAL_COMMUNITY): Payer: Medicare Other

## 2018-05-11 ENCOUNTER — Other Ambulatory Visit: Payer: Self-pay

## 2018-05-11 ENCOUNTER — Emergency Department (HOSPITAL_COMMUNITY)
Admission: EM | Admit: 2018-05-11 | Discharge: 2018-05-11 | Disposition: A | Discharge status: Home / Self Care | Payer: Medicare Other | Attending: Emergency Medicine | Admitting: Emergency Medicine

## 2018-05-11 DIAGNOSIS — I4891 Unspecified atrial fibrillation: Secondary | ICD-10-CM

## 2018-05-11 DIAGNOSIS — Z7982 Long term (current) use of aspirin: Secondary | ICD-10-CM | POA: Insufficient documentation

## 2018-05-11 DIAGNOSIS — T502X5A Adverse effect of carbonic-anhydrase inhibitors, benzothiadiazides and other diuretics, initial encounter: Secondary | ICD-10-CM | POA: Insufficient documentation

## 2018-05-11 DIAGNOSIS — I10 Essential (primary) hypertension: Secondary | ICD-10-CM | POA: Insufficient documentation

## 2018-05-11 DIAGNOSIS — R079 Chest pain, unspecified: Secondary | ICD-10-CM | POA: Diagnosis not present

## 2018-05-11 DIAGNOSIS — Z79899 Other long term (current) drug therapy: Secondary | ICD-10-CM | POA: Diagnosis not present

## 2018-05-11 DIAGNOSIS — E876 Hypokalemia: Secondary | ICD-10-CM | POA: Insufficient documentation

## 2018-05-11 DIAGNOSIS — R000 Tachycardia, unspecified: Secondary | ICD-10-CM | POA: Diagnosis present

## 2018-05-11 LAB — CBC WITH DIFFERENTIAL/PLATELET
Abs Immature Granulocytes: 0.02 10*3/uL (ref 0.00–0.07)
Basophils Absolute: 0 10*3/uL (ref 0.0–0.1)
Basophils Relative: 1 %
Eosinophils Absolute: 0 10*3/uL (ref 0.0–0.5)
Eosinophils Relative: 0 %
HCT: 39.9 % (ref 36.0–46.0)
Hemoglobin: 13.1 g/dL (ref 12.0–15.0)
Immature Granulocytes: 0 %
Lymphocytes Relative: 16 %
Lymphs Abs: 1.3 10*3/uL (ref 0.7–4.0)
MCH: 26.8 pg (ref 26.0–34.0)
MCHC: 32.8 g/dL (ref 30.0–36.0)
MCV: 81.8 fL (ref 80.0–100.0)
Monocytes Absolute: 0.8 10*3/uL (ref 0.1–1.0)
Monocytes Relative: 10 %
Neutro Abs: 6 10*3/uL (ref 1.7–7.7)
Neutrophils Relative %: 73 %
Platelets: 310 10*3/uL (ref 150–400)
RBC: 4.88 MIL/uL (ref 3.87–5.11)
RDW: 14.7 % (ref 11.5–15.5)
WBC: 8.1 10*3/uL (ref 4.0–10.5)
nRBC: 0 % (ref 0.0–0.2)

## 2018-05-11 LAB — BASIC METABOLIC PANEL
Anion gap: 13 (ref 5–15)
BUN: 16 mg/dL (ref 8–23)
CO2: 20 mmol/L — ABNORMAL LOW (ref 22–32)
Calcium: 9.8 mg/dL (ref 8.9–10.3)
Chloride: 104 mmol/L (ref 98–111)
Creatinine, Ser: 0.77 mg/dL (ref 0.44–1.00)
GFR calc Af Amer: >60 mL/min (ref >60)
GFR calc non Af Amer: >60 mL/min (ref >60)
Glucose, Bld: 144 mg/dL — ABNORMAL HIGH (ref 70–99)
Potassium: 2.9 mmol/L — ABNORMAL LOW (ref 3.5–5.1)
Sodium: 137 mmol/L (ref 135–145)

## 2018-05-11 LAB — BRAIN NATRIURETIC PEPTIDE: B Natriuretic Peptide: 101 pg/mL — ABNORMAL HIGH (ref 0.0–100.0)

## 2018-05-11 LAB — MAGNESIUM: Magnesium: 2 mg/dL (ref 1.7–2.4)

## 2018-05-11 LAB — TROPONIN I: Troponin I: <0.03 ng/mL (ref <0.03)

## 2018-05-11 LAB — TSH: TSH: 0.62 u[IU]/mL (ref 0.350–4.500)

## 2018-05-11 MED ORDER — APIXABAN 5 MG PO TABS
5.0000 mg | ORAL_TABLET | Freq: Two times a day (BID) | ORAL | Status: DC
  Administered 2018-05-11: 5 mg via ORAL
  Filled 2018-05-11: qty 1

## 2018-05-11 MED ORDER — APIXABAN 5 MG PO TABS: 5.0000 mg | ORAL_TABLET | Freq: Two times a day (BID) | ORAL | 0 refills | Status: DC

## 2018-05-11 MED ORDER — POTASSIUM CHLORIDE CRYS ER 20 MEQ PO TBCR: 20.0000 meq | EXTENDED_RELEASE_TABLET | Freq: Two times a day (BID) | ORAL | 0 refills | Status: DC

## 2018-05-11 MED ORDER — METOPROLOL TARTRATE 25 MG PO TABS: 25.0000 mg | ORAL_TABLET | Freq: Two times a day (BID) | ORAL | 0 refills | Status: DC

## 2018-05-11 MED ORDER — APIXABAN 5 MG PO TABS
5.0000 mg | ORAL_TABLET | Freq: Once | ORAL | Status: DC
  Filled 2018-05-11: qty 1

## 2018-05-11 MED ORDER — METOPROLOL TARTRATE 25 MG PO TABS
25.0000 mg | ORAL_TABLET | Freq: Once | ORAL | Status: AC
  Administered 2018-05-11: 05:00:00 25 mg via ORAL
  Filled 2018-05-11: qty 1

## 2018-05-11 MED ORDER — POTASSIUM CHLORIDE CRYS ER 20 MEQ PO TBCR
40.0000 meq | EXTENDED_RELEASE_TABLET | Freq: Once | ORAL | Status: AC
  Administered 2018-05-11: 40 meq via ORAL
  Filled 2018-05-11: qty 2

## 2018-05-11 MED ORDER — PROPOFOL 10 MG/ML IV BOLUS
0.5000 mg/kg | Freq: Once | INTRAVENOUS | Status: AC
  Administered 2018-05-11: 05:00:00 50.8 mg via INTRAVENOUS
  Filled 2018-05-11: qty 20

## 2018-05-11 NOTE — ED Provider Notes (Signed)
[facility] EMERGENCY DEPARTMENT Provider Note   CSN: [id number] Arrival date & time: 05/11/18  0321    History   Chief Complaint No chief complaint on file.   HPI Bridget [name] is a 73 y.o. female.   The history is provided by the patient.  She has a history of hypertension and comes in with her heart beating fast and skipping beats.  This woke her up at about 1:30 AM.  She felt fine through the day yesterday.  She did break out in a sweat but denies chest pain, heaviness, tightness, pressure and she denies dyspnea.  This has never happened before.  Past Medical History:  Diagnosis Date  . Hypertension   . Seasonal allergies     Patient Active Problem List   Diagnosis Date Noted  . Arthritis of left acromioclavicular joint 04/26/2018  . Partial nontraumatic tear of left rotator cuff 04/26/2018  . Hypertension   . Seasonal allergies     Past Surgical History:  Procedure Laterality Date  . BREAST EXCISIONAL BIOPSY Left   . CHOLECYSTECTOMY  02/2010  . KNEE SURGERY    . OOPHORECTOMY  01/2003   RSO AT [facility]  . VAGINAL HYSTERECTOMY  01/2003   [facility] A&P REPAIR     OB History    Gravida  3   Para  2   Term  2   Preterm      AB  1   Living  2     SAB      TAB      Ectopic      Multiple      Live Births               Home Medications    Prior to Admission medications   Medication Sig Start Date End Date Taking? Authorizing Provider  apixaban (ELIQUIS) 5 MG TABS tablet Take 1 tablet (5 mg total) by mouth 2 (two) times daily. 05/06/68   Delora Fuel, MD  aspirin 81 MG tablet Take 81 mg by mouth daily.      [provider]  benazepril-hydrochlorthiazide (LOTENSIN HCT) 20-25 MG per tablet Take 1 tablet by mouth daily. 04/30/14   [provider]  CALCIUM PO Take 1 tablet by mouth daily.     [provider]  cholecalciferol (VITAMIN D) 1000 UNITS tablet Take 1,000 Units by mouth daily.      [provider]  gabapentin (NEURONTIN) 100 MG capsule Take 2 capsules (200 mg total) by mouth at bedtime. 04/26/18   Lyndal Pulley, DO  meclizine (ANTIVERT) 25 MG tablet Take 1 tablet (25 mg total) by mouth 3 (three) times daily as needed for dizziness. 07/07/14   Julianne Rice, MD  Multiple Vitamin (MULTIVITAMIN WITH MINERALS) TABS tablet Take 1 tablet by mouth daily.    [provider]  ondansetron (ZOFRAN ODT) 4 MG disintegrating tablet 4mg  ODT q4 hours prn nausea/vomit 07/07/14   Julianne Rice, MD    Family History Family History  Problem Relation Age of Onset  . Heart disease Mother   . Diabetes Father        AODM  . Hypertension Father   . Cancer Father        LUNG  . Cancer Brother 53       BLADDER  . Breast cancer Sister 5    Social History Social History   Tobacco Use  . Smoking status: Never Smoker  . Smokeless tobacco: Never Used  Substance Use Topics  . Alcohol use: Yes    Comment: SELDOM  . Drug use: No     Allergies   Codeine   Review of Systems Review of Systems  All other systems reviewed and are negative.    Physical Exam Updated Vital Signs BP (!) 149/80 (BP Location: Right Arm)   Pulse (!) 112   Temp 97.6 F (36.4 C) (Oral)   Resp 18   LMP 01/07/2003   SpO2 97%   Physical Exam Vitals signs and nursing note reviewed.    73 year old female, resting comfortably and in no acute distress. Vital signs are significant for elevated blood pressure and heart rate. Oxygen saturation is 97%, which is normal. Head is normocephalic and atraumatic. PERRLA, EOMI. Oropharynx is clear. Neck is nontender and supple without adenopathy or JVD. Back is nontender and there is no CVA tenderness. Lungs are clear without rales, wheezes, or rhonchi. Chest is nontender. Heart has an irregularly irregular rhythm without murmur. Abdomen is soft, flat, nontender without masses or hepatosplenomegaly and peristalsis is normoactive. Extremities have  1-2+edema, full range of motion is present. Skin is warm and dry without rash. Neurologic: Mental status is normal, cranial nerves are intact, there are no motor or sensory deficits.  ED Treatments / Results  Labs (all labs ordered are listed, but only abnormal results are displayed) Labs Reviewed  BASIC METABOLIC PANEL - Abnormal; Notable for the following components:      Result Value   Potassium 2.9 (*)    CO2 20 (*)    Glucose, Bld 144 (*)    All other components within normal limits  BRAIN NATRIURETIC PEPTIDE - Abnormal; Notable for the following components:   B Natriuretic Peptide 101.0 (*)    All other components within normal limits  TROPONIN I  CBC WITH DIFFERENTIAL/PLATELET  MAGNESIUM  TSH    EKG Interpretation  Date/Time:  Tuesday May 11 2018 03:30:40 EDT Ventricular Rate:  103 PR Interval:    QRS Duration: 86 QT Interval:  370 QTC Calculation: 484 R Axis:   17 Text Interpretation:  Atrial fibrillation with rapid ventricular response Low voltage QRS Cannot rule out Anterior infarct , age undetermined Abnormal ECG When compared with ECG of 07/07/2014, Atrial fibrillation has replaced Sinus rhythm Confirmed by Delora Fuel ([id number]) on 05/11/2018 3:38:06 AM        EKG Interpretation  Date/Time:  Tuesday May 11 2018 04:36:36 EDT Ventricular Rate:  75 PR Interval:    QRS Duration: 100 QT Interval:  418 QTC Calculation: 467 R Axis:   11 Text Interpretation:  Age not entered, assumed to be  73 years old for purpose of ECG interpretation Atrial fibrillation Low voltage, precordial leads Abnormal R-wave progression, early transition Minimal ST depression, diffuse leads When compared with ECG of EARLIER SAME DATE HEART RATE has decreased Confirmed by Delora Fuel ([id number]) on 05/11/2018 5:47:28 AM       EKG Interpretation  Date/Time:  Tuesday May 11 2018 04:37:11 EDT Ventricular Rate:  93 PR Interval:    QRS Duration: 100 QT Interval:  422 QTC Calculation: 525 R Axis:    15 Text Interpretation:  Age not entered, assumed to be  72 years old for purpose of ECG interpretation Atrial fibrillation Low voltage, precordial leads Abnormal R-wave progression, early transition Minimal ST depression, anterolateral leads Prolonged QT interval When compared with ECG of EARLIER SAME DATE QT has lengthened Confirmed by Delora Fuel ([id number]) on 05/11/2018 5:48:27 AM  EKG Interpretation  Date/Time:  Tuesday May 11 2018 04:37:48 EDT Ventricular Rate:  100 PR Interval:    QRS Duration: 92 QT Interval:  400 QTC Calculation: 516 R Axis:   22 Text Interpretation:  Age not entered, assumed to be  73 years old for purpose of ECG interpretation Sinus or ectopic atrial tachycardia Multiple premature complexes, vent & supraven Sinus pause Low voltage, precordial leads Abnormal R-wave progression, early transition Borderline T abnormalities, inferior leads Prolonged QT interval When compared with ECG of EARLIER SAME DATE No significant change was found Confirmed by Delora Fuel ([id number]) on 05/11/2018 5:49:12 AM       EKG Interpretation  Date/Time:  Tuesday May 11 2018 04:39:24 EDT Ventricular Rate:  63 PR Interval:    QRS Duration: 100 QT Interval:  437 QTC Calculation: 448 R Axis:   11 Text Interpretation:  Age not entered, assumed to be  73 years old for purpose of ECG interpretation Unknown rhythm, irregular rate Low voltage, precordial leads Abnormal R-wave progression, early transition Minimal ST depression, diffuse leads When compared with ECG of EARLIER SAME DATE QT has shortened Confirmed by Delora Fuel ([id number]) on 05/11/2018 5:50:01 AM        Radiology Dg Chest Port 1 View  Result Date: 05/11/2018 CLINICAL DATA:  Chest pain EXAM: PORTABLE CHEST 1 VIEW COMPARISON:  02/04/2010 FINDINGS: Heart and mediastinal contours are within normal limits. No focal opacities or effusions. No acute bony abnormality. IMPRESSION: No active disease. Electronically Signed   By: Rolm Baptise M.D.   On: 05/11/2018 03:56    Procedures .Sedation Date/Time: 05/11/2018 4:20 AM Performed by: Delora Fuel, MD Authorized by: Delora Fuel, MD   Consent:    Consent obtained:  Verbal   Consent given by:  Patient   Risks discussed:  Allergic reaction, dysrhythmia, inadequate sedation, nausea, prolonged hypoxia resulting in organ damage, prolonged sedation necessitating reversal, respiratory compromise necessitating ventilatory assistance and intubation and vomiting   Alternatives discussed:  Analgesia without sedation, anxiolysis and regional anesthesia Universal protocol:    Procedure explained and questions answered to patient or proxy's satisfaction: yes     Relevant documents present and verified: yes     Test results available and properly labeled: yes     Imaging studies available: yes     Required blood products, implants, devices, and special equipment available: yes     Site/side marked: yes     Immediately prior to procedure a time out was called: yes     Patient identity confirmation method:  Verbally with patient Indications:    Procedure performed:  Cardioversion   Procedure necessitating sedation performed by:  Physician performing sedation Pre-sedation assessment:    Time since last food or drink:  6 hours   ASA classification: class 1 - normal, healthy patient     Neck mobility: normal     Mouth opening:  3 or more finger widths   Thyromental distance:  4 finger widths   Mallampati score:  I - soft palate, uvula, fauces, pillars visible   Pre-sedation assessments completed and reviewed: airway patency, cardiovascular function, hydration status, mental status, nausea/vomiting, pain level, respiratory function and temperature   Immediate pre-procedure details:    Reassessment: Patient reassessed immediately prior to procedure     Reviewed: vital signs, relevant labs/tests and NPO status     Verified: bag valve mask available, emergency equipment available,  intubation equipment available, IV patency confirmed, oxygen available and suction available   Procedure details (see  MAR for exact dosages):    Preoxygenation:  Nasal cannula   Sedation:  Propofol   Intra-procedure monitoring:  Blood pressure monitoring, cardiac monitor, continuous pulse oximetry, frequent LOC assessments, frequent vital sign checks and continuous capnometry   Intra-procedure events: none     Total Provider sedation time (minutes):  30 Post-procedure details:    Post-sedation assessment completed:  05/11/2018 4:50 AM   Attendance: Constant attendance by certified staff until patient recovered     Recovery: Patient returned to pre-procedure baseline     Post-sedation assessments completed and reviewed: airway patency, cardiovascular function, hydration status, mental status, nausea/vomiting, pain level, respiratory function and temperature     Patient is stable for discharge or admission: yes     Patient tolerance:  Tolerated well, no immediate complications .Cardioversion Date/Time: 05/11/2018 4:44 AM Performed by: Delora Fuel, MD Authorized by: Delora Fuel, MD   Consent:    Consent obtained:  Written   Consent given by:  Patient   Risks discussed:  Cutaneous burn, induced arrhythmia and death   Alternatives discussed:  Rate-control medication Pre-procedure details:    Cardioversion basis:  Emergent   Rhythm:  Atrial fibrillation   Electrode placement:  Anterior-posterior Patient sedated: Yes. Refer to sedation procedure documentation for details of sedation.  Attempt one:    Cardioversion mode:  Synchronous   Waveform:  Biphasic   Shock (Joules):  120   Shock outcome:  No change in rhythm Attempt two:    Cardioversion mode:  Synchronous   Waveform:  Biphasic   Shock (Joules):  200   Shock outcome:  No change in rhythm Attempt three:    Cardioversion mode:  Synchronous   Waveform:  Biphasic   Shock (Joules):  200   Shock outcome:  No change in rhythm  Post-procedure details:    Patient status:  Awake   Patient tolerance of procedure:  Tolerated well, no immediate complications    CRITICAL CARE Performed by: Delora Fuel Total critical care time: 40 minutes Critical care time was exclusive of separately billable procedures and treating other patients. Critical care was necessary to treat or prevent imminent or life-threatening deterioration. Critical care was time spent personally by me on the following activities: development of treatment plan with patient and/or surrogate as well as nursing, discussions with consultants, evaluation of patient's response to treatment, examination of patient, obtaining history from patient or surrogate, ordering and performing treatments and interventions, ordering and review of laboratory studies, ordering and review of radiographic studies, pulse oximetry and re-evaluation of patient's condition.  Medications Ordered in ED Medications  propofol (DIPRIVAN) 10 mg/mL bolus/IV push 0.5 mg/kg (has no administration in time range)  apixaban (ELIQUIS) tablet 5 mg (has no administration in time range)     Initial Impression / Assessment and Plan / ED Course  I have reviewed the triage vital signs and the nursing notes.  Pertinent labs & imaging results that were available during my care of the patient were reviewed by me and considered in my medical decision making (see chart for details).  New onset atrial fibrillation.  Screening labs are obtained.  Old records are reviewed, and she has no relevant past visits.  Cardioversion was attempted x4 with rhythm staying atrial fibrillation.  Case was discussed with Dr. Sonia Side of cardiology service and decision was made to add metoprolol to her regimen for rate control and discharge her on apixaban with follow-up in the atrial fibrillation Clinic.  Labs are significant for hypokalemia secondary to losartan-hydrochlorothiazide  she is taking for hypertension.  She is  given oral potassium here and a prescription for potassium is given.  Given her degree of hypokalemia, I suspect she will need to be on long-term potassium supplementation.  Chest x-ray shows no acute process.   CHA2DS2/VAS Stroke Risk Points     3>= 2 Points: High Risk  1 - 1.99 Points: Medium Risk  0 Points: Low Risk   She gets one point each for age, female sex, history of hypertension.  :   Last Change: N/A     This score determines the patient's risk of having a stroke if the  patient has atrial fibrillation.       Her score is 3, anticoagulation is indicated.  Final Clinical Impressions(s) / ED Diagnoses   Final diagnoses:  Atrial fibrillation, new onset ([place])  Diuretic-induced hypokalemia    ED Discharge Orders         Ordered    Amb Referral to AFIB Clinic     05/11/18 0357    apixaban (ELIQUIS) 5 MG TABS tablet  2 times daily     05/11/18 0357    metoprolol tartrate (LOPRESSOR) 25 MG tablet  2 times daily     05/11/18 0512    potassium chloride SA (K-DUR) 20 MEQ tablet  2 times daily     05/11/18 1031           Delora Fuel, MD 59/[birth year]/85 [phone]

## 2018-05-11 NOTE — ED Notes (Signed)
X ray in room.

## 2018-05-11 NOTE — ED Notes (Signed)
If patient is discharged, daughter will be her ride home. Is waiting out in parking lot

## 2018-05-11 NOTE — Discharge Instructions (Addendum)
Follow up with the Atrial Fibrillation Clinic.  Return if you are having any problems.

## 2018-05-12 ENCOUNTER — Other Ambulatory Visit: Payer: Self-pay

## 2018-05-12 ENCOUNTER — Other Ambulatory Visit (HOSPITAL_COMMUNITY): Payer: Self-pay | Admitting: *Deleted

## 2018-05-12 ENCOUNTER — Encounter (HOSPITAL_COMMUNITY): Payer: Self-pay | Admitting: Physician Assistant

## 2018-05-12 ENCOUNTER — Ambulatory Visit (HOSPITAL_COMMUNITY)
Admission: RE | Admit: 2018-05-12 | Discharge: 2018-05-12 | Disposition: A | Discharge status: Home / Self Care | Payer: Medicare Other | Source: Ambulatory Visit | Attending: Nurse Practitioner | Admitting: Nurse Practitioner

## 2018-05-12 VITALS — BP 110/68 | HR 49 | Ht 64.0 in | Wt 222.0 lb

## 2018-05-12 DIAGNOSIS — I480 Paroxysmal atrial fibrillation: Secondary | ICD-10-CM

## 2018-05-12 NOTE — Progress Notes (Signed)
Electrophysiology TeleHealth Note   Due to national recommendations of social distancing due to [place] 19, Audio/video telehealth visit is felt to be most appropriate for this patient at this time.  See consent below from today for patient consent regarding telehealth for the Atrial Fibrillation Clinic. Consent obtained verbally.   Date:  05/12/2018   ID:  Bridget [name], DOB 1945-11-12, MRN [id number]  Location: home  Provider location: [address] [place], [place] [address] Evaluation Performed: New patient consult  PCP:  Darcus Austin, MD (Inactive)   CC: Evaluation for new onset atrial fibrillation   History of Present Illness: Bridget [name] is a 73 y.o. female who presents via audio/video conferencing for a telehealth visit today.   The patient is referred for new consultation regarding new onset atrial fibrillation by [facility] ER. Patient was in her usual state of health until the early morning hours of 05/11/18 when she woke with heart racing, palpitations, and diaphoresis. No associated chest discomfort or SOB. She presented to the ER and was found to be in afib with RVR. Cardioversion was attempted x4 but was unsuccessful. Of note, her K+ was 2.9 and patient admits she had diarrhea for four days prior. She denies ever having symptoms like this before. She was started on Eliquis and metoprolol and discharged in rate controlled afib. Reports that she feels fine today with no further symptoms. Her daughter (who is a PA) palpated her pulse and noted it was regular. She does admit to snoring but denies significant alcohol use.   Today, she denies symptoms of palpitations, chest pain, shortness of breath, orthopnea, PND, lower extremity edema, claudication, dizziness, presyncope, syncope, bleeding, or neurologic sequela. The patient is tolerating medications without difficulties and is otherwise without complaint today.   she denies symptoms of cough, fevers, chills, or new SOB  worrisome for COVID 19.     Atrial Fibrillation Risk Factors:  she does have symptoms of sleep apnea. she does not have a history of rheumatic fever. she does not have a history of alcohol use. The patient does not have a history of early familial atrial fibrillation or other arrhythmias.  she has a BMI of Body mass index is 38.11 kg/m.Marland Kitchen Filed Weights   05/12/18 1122  Weight: 100.7 kg   BP 118/68 Pulse 49 Provided by pt with home BP machine.  Past Medical History:  Diagnosis Date  . Hypertension   . Seasonal allergies    Past Surgical History:  Procedure Laterality Date  . BREAST EXCISIONAL BIOPSY Left   . CHOLECYSTECTOMY  02/2010  . KNEE SURGERY    . OOPHORECTOMY  01/2003   RSO AT [facility]  . VAGINAL HYSTERECTOMY  01/2003   [facility]O A&P REPAIR     Current Outpatient Medications  Medication Sig Dispense Refill  . apixaban (ELIQUIS) 5 MG TABS tablet Take 1 tablet (5 mg total) by mouth 2 (two) times daily. 60 tablet 0  . aspirin 81 MG tablet Take 81 mg by mouth daily.      . benazepril-hydrochlorthiazide (LOTENSIN HCT) 20-25 MG per tablet Take 1 tablet by mouth daily.    . calcium carbonate (OSCAL) 1500 (600 Ca) MG TABS tablet Take by mouth 2 (two) times daily with a meal.    . DILT-XR 180 MG 24 hr capsule Take 180 mg by mouth daily.    . metoprolol tartrate (LOPRESSOR) 25 MG tablet Take 1 tablet (25 mg total) by mouth 2 (two) times daily. 60 tablet  0  . Multiple Vitamin (MULTIVITAMIN WITH MINERALS) TABS tablet Take 1 tablet by mouth daily.    . potassium chloride SA (K-DUR) 20 MEQ tablet Take 40 mEq by mouth 2 (two) times daily. For 5 days and then one tablet bid    . pravastatin (PRAVACHOL) 20 MG tablet Take 20 mg by mouth at bedtime.     No current facility-administered medications for this encounter.     Allergies:   Codeine   Social History:  The patient  reports that she has never smoked. She has never used smokeless tobacco. She reports current alcohol use. She  reports that she does not use drugs.   Family History:  The patient's  family history includes Breast cancer (age of onset: 80) in her sister; Cancer in her father; Cancer (age of onset: 23) in her brother; Diabetes in her father; Heart disease in her mother; Hypertension in her father.    ROS:  Please see the history of present illness.   All other systems are personally reviewed and negative.   Exam: Well appearing, alert and conversant, regular work of breathing,  good skin color  Recent Labs: 05/11/2018: B Natriuretic Peptide 101.0; BUN 16; Creatinine, Ser 0.77; Hemoglobin 13.1; Magnesium 2.0; Platelets 310; Potassium 2.9; Sodium 137; TSH 0.620  personally reviewed    Other studies personally reviewed: Additional studies/ records that were reviewed today include: Epic notes   ASSESSMENT AND PLAN:  1.  Paroxysmal atrial fibrillation Patient has new onset paroxysmal atrial fibrillation.  ? Related to low K+.  Pulse is regular on palpation today per pt. Asymptomatic, possible she is back in SR. General education about afib and anticoagulation provided and questions answered. Continue Eliquis 5 mg BID. Will stop ASA. Continue metoprolol 25 mg BID Continue K+ supplementation 40 meq BID for 5 days. Then decrease to 20 meq BID. Recheck Bmet in one week Check echocardiogram once COVID-19 precautions end. Lifestyle changes as below.  This patients CHA2DS2-VASc Score and unadjusted Ischemic Stroke Rate (% per year) is equal to 3.2 % stroke rate/year from a score of 3  Above score calculated as 1 point each if present [CHF, HTN, DM, Vascular=MI/PAD/Aortic Plaque, Age if 65-74, or Female] Above score calculated as 2 points each if present [Age > 75, or Stroke/TIA/TE]  2. HTN Stable, no changes today  3. Bradycardia Patient asymptomatic, likely related to BB.  Will consider decreasing BB if patient becomes symptomatic.   4. Snoring Discussed importance of treating possible sleep  apnea for maintaining SR long-term. Will arrange for sleep study.  5. Obesity Body mass index is 38.11 kg/m. Lifestyle modification was discussed and encouraged including regular physical activity and weight reduction.   COVID screen The patient does not have any symptoms that suggest any further testing/ screening at this time.  Social distancing reinforced today.    Follow-up in AF clinic in one month.  Current medicines are reviewed at length with the patient today.   The patient does not have concerns regarding her medicines.  The following changes were made today:  none  Labs/ tests ordered today include: Bmet No orders of the defined types were placed in this encounter.   Patient Risk:  after full review of this patients clinical status, I feel that they are at moderate risk at this time.   Today, I have spent 33 minutes with the patient with telehealth technology discussing atrial fibrillation, lifestyle changes, anticoagulation, and COVID-19 precautions.    Signed, Adline Peals PA-C 05/12/2018  11:30 AM  Afib [facility] [address] [place], [place] [address] [phone]   I hereby voluntarily request, consent and authorize the [facility] and its employed or contracted physicians, physician assistants, nurse practitioners or other licensed health care professionals (the Practitioner), to provide me with telemedicine health care services (the "Services") as deemed necessary by the treating Practitioner. I acknowledge and consent to receive the Services by the Practitioner via telemedicine. I understand that the telemedicine visit will involve communicating with the Practitioner through live audiovisual communication technology and the disclosure of certain medical information by electronic transmission. I acknowledge that I have been given the opportunity to request an in-person assessment or other available alternative prior to the  telemedicine visit and am voluntarily participating in the telemedicine visit.   I understand that I have the right to withhold or withdraw my consent to the use of telemedicine in the course of my care at any time, without affecting my right to future care or treatment, and that the Practitioner or I may terminate the telemedicine visit at any time. I understand that I have the right to inspect all information obtained and/or recorded in the course of the telemedicine visit and may receive copies of available information for a reasonable fee.  I understand that some of the potential risks of receiving the Services via telemedicine include:   Delay or interruption in medical evaluation due to technological equipment failure or disruption;  Information transmitted may not be sufficient (e.g. poor resolution of images) to allow for appropriate medical decision making by the Practitioner; and/or  In rare instances, security protocols could fail, causing a breach of personal health information.   Furthermore, I acknowledge that it is my responsibility to provide information about my medical history, conditions and care that is complete and accurate to the best of my ability. I acknowledge that Practitioner's advice, recommendations, and/or decision may be based on factors not within their control, such as incomplete or inaccurate data provided by me or distortions of diagnostic images or specimens that may result from electronic transmissions. I understand that the practice of medicine is not an exact science and that Practitioner makes no warranties or guarantees regarding treatment outcomes. I acknowledge that I will receive a copy of this consent concurrently upon execution via email to the email address I last provided but may also request a printed copy by calling the office of the [facility].  I understand that my insurance will be billed for this visit.   I have read or had this consent  read to me.  I understand the contents of this consent, which adequately explains the benefits and risks of the Services being provided via telemedicine.  I have been provided ample opportunity to ask questions regarding this consent and the Services and have had my questions answered to my satisfaction.  I give my informed consent for the services to be provided through the use of telemedicine in my medical care  By participating in this telemedicine visit I agree to the above.

## 2018-05-13 DIAGNOSIS — K9189 Other postprocedural complications and disorders of digestive system: Secondary | ICD-10-CM | POA: Diagnosis not present

## 2018-05-13 DIAGNOSIS — R7303 Prediabetes: Secondary | ICD-10-CM | POA: Diagnosis not present

## 2018-05-13 DIAGNOSIS — E7800 Pure hypercholesterolemia, unspecified: Secondary | ICD-10-CM | POA: Diagnosis not present

## 2018-05-13 DIAGNOSIS — I10 Essential (primary) hypertension: Secondary | ICD-10-CM | POA: Diagnosis not present

## 2018-05-13 DIAGNOSIS — Z79899 Other long term (current) drug therapy: Secondary | ICD-10-CM | POA: Diagnosis not present

## 2018-05-13 DIAGNOSIS — I4891 Unspecified atrial fibrillation: Secondary | ICD-10-CM | POA: Diagnosis not present

## 2018-05-13 DIAGNOSIS — E876 Hypokalemia: Secondary | ICD-10-CM | POA: Diagnosis not present

## 2018-05-18 ENCOUNTER — Encounter: Payer: Self-pay | Admitting: Physician Assistant

## 2018-05-18 DIAGNOSIS — E876 Hypokalemia: Secondary | ICD-10-CM | POA: Diagnosis not present

## 2018-05-18 DIAGNOSIS — Z79899 Other long term (current) drug therapy: Secondary | ICD-10-CM | POA: Diagnosis not present

## 2018-05-24 ENCOUNTER — Ambulatory Visit: Payer: Medicare Other | Admitting: Family Medicine

## 2018-05-24 ENCOUNTER — Telehealth (HOSPITAL_COMMUNITY): Payer: Self-pay | Admitting: Physician Assistant

## 2018-05-24 NOTE — Telephone Encounter (Signed)
Labs from [facility] 05/18/18 Bmet Cr 0.76, K+ 4.5  Hgb 13.3  Mag 2.3

## 2018-05-26 ENCOUNTER — Ambulatory Visit: Payer: Medicare Other | Admitting: Family Medicine

## 2018-05-27 ENCOUNTER — Other Ambulatory Visit (HOSPITAL_COMMUNITY): Payer: Self-pay | Admitting: *Deleted

## 2018-05-27 DIAGNOSIS — I480 Paroxysmal atrial fibrillation: Secondary | ICD-10-CM

## 2018-05-28 ENCOUNTER — Telehealth (HOSPITAL_COMMUNITY): Payer: Self-pay | Admitting: *Deleted

## 2018-05-28 NOTE — Telephone Encounter (Signed)
Patient calling to find out if she needs to continue taking the prescribed potassium at 12meq bid. Her potassium on recent labwork at PCP office was 4.5.  Will forward to [facility] for further review.

## 2018-05-28 NOTE — Telephone Encounter (Signed)
Her K+ has been low in the remote past as well. She may require long term supplementation. Let's decrease to KCl 20 meq daily and recheck Bmet in two weeks.

## 2018-06-03 ENCOUNTER — Other Ambulatory Visit (HOSPITAL_COMMUNITY): Payer: Medicare Other

## 2018-06-04 ENCOUNTER — Other Ambulatory Visit (HOSPITAL_COMMUNITY): Payer: Self-pay | Admitting: *Deleted

## 2018-06-04 MED ORDER — APIXABAN 5 MG PO TABS: 5.0000 mg | ORAL_TABLET | Freq: Two times a day (BID) | ORAL | 2 refills | Status: DC

## 2018-06-08 ENCOUNTER — Other Ambulatory Visit: Payer: Self-pay

## 2018-06-08 ENCOUNTER — Encounter: Payer: Self-pay | Admitting: Family Medicine

## 2018-06-08 ENCOUNTER — Ambulatory Visit (INDEPENDENT_AMBULATORY_CARE_PROVIDER_SITE_OTHER): Payer: Medicare Other | Admitting: Family Medicine

## 2018-06-08 DIAGNOSIS — M75112 Incomplete rotator cuff tear or rupture of left shoulder, not specified as traumatic: Secondary | ICD-10-CM

## 2018-06-08 NOTE — Assessment & Plan Note (Signed)
Patient does have a partial tear of the left rotator cuff.  Possible some mild improvement since previous exam.  We discussed icing regimen.  Patient wants to hold on formal physical therapy or an intra-articular injection at this time.  Follow-up again in 4 to 5 weeks and will consider either if necessary.

## 2018-06-08 NOTE — Patient Instructions (Signed)
See me in 4-5 weeks 

## 2018-06-08 NOTE — Progress Notes (Signed)
[name] [address] [place], [place] [address] Phone: [phone] Subjective:   Bridget [name], am serving as a scribe for Dr. Hulan Saas.   CC: Left shoulder pain  DGL:[id number]   04/26/2018: Patient given injection and tolerated the procedure well.  We discussed icing regimen and home exercise.  We discussed which activities to do which wants to avoid.  Patient will increase activity slowly over the course the next several days.  Follow-up again 4 to 5 weeks  Partial tear noted.  Discussed with patient that this could be contributing as well.  Home exercise program was given.  Patient has been doing this including home exercises and formal physical therapy for quite some time.  Patient's if not improving will consider an MRI.  We will hold on this at the moment secondary to the corona outbreak  Update 06/08/2018: Bridget [name] is a 73 y.o. female coming in with complaint of left shoulder pain.  Found to have more of a partial tear of the rotator cuff as well as arthritic changes at the acromioclavicular joint.  Injected in the acromioclavicular joint April 20 of 2020.  Since we have seen patient has been diagnosed with paroxysmal atrial fibrillation.  Patient states that she is doing much better. Is still having pain with flexion and extension.      Past Medical History:  Diagnosis Date  . Hypertension   . Seasonal allergies    Past Surgical History:  Procedure Laterality Date  . BREAST EXCISIONAL BIOPSY Left   . CHOLECYSTECTOMY  02/2010  . KNEE SURGERY    . OOPHORECTOMY  01/2003   RSO AT [facility]  . VAGINAL HYSTERECTOMY  01/2003   [facility] A&P REPAIR   Social History   Socioeconomic History  . Marital status: Married    Spouse name: Not on file  . Number of children: Not on file  . Years of education: Not on file  . Highest education level: Not on file  Occupational History  . Not on file  Social Needs  . Financial resource strain:  Not on file  . Food insecurity:    Worry: Not on file    Inability: Not on file  . Transportation needs:    Medical: Not on file    Non-medical: Not on file  Tobacco Use  . Smoking status: Never Smoker  . Smokeless tobacco: Never Used  Substance and Sexual Activity  . Alcohol use: Yes    Comment: SELDOM  . Drug use: [name]  . Sexual activity: Not on file    Comment: HYST  Lifestyle  . Physical activity:    Days per week: Not on file    Minutes per session: Not on file  . Stress: Not on file  Relationships  . Social connections:    Talks on phone: Not on file    Gets together: Not on file    Attends religious service: Not on file    Active member of club or organization: Not on file    Attends meetings of clubs or organizations: Not on file    Relationship status: Not on file  Other Topics Concern  . Not on file  Social History Narrative  . Not on file   Allergies  Allergen Reactions  . Codeine Nausea Only   Family History  Problem Relation Age of Onset  . Heart disease Mother   . Diabetes Father        AODM  .  Hypertension Father   . Cancer Father        LUNG  . Cancer Brother 47       BLADDER  . Breast cancer Sister 87     Current Outpatient Medications (Cardiovascular):  .  benazepril-hydrochlorthiazide (LOTENSIN HCT) 20-25 MG per tablet, Take 1 tablet by mouth daily. Marland Kitchen  DILT-XR 180 MG 24 hr capsule, Take 180 mg by mouth daily. .  metoprolol tartrate (LOPRESSOR) 25 MG tablet, Take 1 tablet (25 mg total) by mouth 2 (two) times daily. .  pravastatin (PRAVACHOL) 20 MG tablet, Take 20 mg by mouth at bedtime.    Current Outpatient Medications (Hematological):  .  apixaban (ELIQUIS) 5 MG TABS tablet, Take 1 tablet (5 mg total) by mouth 2 (two) times daily.  Current Outpatient Medications (Other):  .  calcium carbonate (OSCAL) 1500 (600 Ca) MG TABS tablet, Take by mouth 2 (two) times daily with a meal. .  Multiple Vitamin (MULTIVITAMIN WITH MINERALS) TABS  tablet, Take 1 tablet by mouth daily. .  potassium chloride SA (K-DUR) 20 MEQ tablet, Take 40 mEq by mouth 2 (two) times daily. For 5 days and then one tablet bid    Past medical history, social, surgical and family history all reviewed in electronic medical record.  [name] unless stated regarding to the chief complaint.   Review of Systems:  [name] headache, visual changes, nausea, vomiting, diarrhea, constipation, dizziness, abdominal pain, skin rash, fevers, chills, night sweats, weight loss, swollen lymph nodes, body aches, joint swelling, chest pain, shortness of breath, mood changes.  Positive muscle aches Objective  Blood pressure 118/84, pulse (!) 46, height 5\' 4"  (1.626 m), weight 222 lb (100.7 kg), last menstrual period 01/07/2003, SpO2 99 %.    General: [name] apparent distress alert and oriented x3 mood and affect normal, dressed appropriately.  HEENT: Pupils equal, extraocular movements intact  Respiratory: Patient's speak in full sentences and does not appear short of breath  Cardiovascular: [name] lower extremity edema, non tender, [name] erythema  Skin: Warm dry intact with [name] signs of infection or rash on extremities or on axial skeleton.  Abdomen: Soft nontender  Neuro: Cranial nerves II through XII are intact, neurovascularly intact in all extremities with 2+ DTRs and 2+ pulses.  Lymph: [name] lymphadenopathy of posterior or anterior cervical chain or axillae bilaterally.  Gait normal with good balance and coordination.  MSK:  tender with full range of motion and good stability and symmetric strength and tone of  elbows, wrist, hip, knee and ankles bilaterally.  Mild arthritic changes of multiple joints Shoulder: left  Inspection reveals [name] abnormalities, atrophy or asymmetry. Palpation is normal with [name] tenderness over AC joint or bicipital groove. Decreased range of motion of 5 degrees in all planes and 10 degrees of external rotation Rotator cuff strength 4+ out of 5  Positive impingement still noted Speeds and Yergason's tests normal. [name] labral pathology noted with negative Obrien's, negative clunk and good stability.  Positive crossover Normal scapular function observed. [name] painful arc and [name] drop arm sign. [name] apprehension sign Contralateral shoulder unremarkable     Impression and Recommendations:    . The above documentation has been reviewed and is accurate and complete Lyndal Pulley, DO       Note: This dictation was prepared with Dragon dictation along with smaller phrase technology. Any transcriptional errors that result from this process are unintentional.

## 2018-06-10 ENCOUNTER — Ambulatory Visit (HOSPITAL_COMMUNITY)
Admission: RE | Admit: 2018-06-10 | Discharge: 2018-06-10 | Disposition: A | Discharge status: Home / Self Care | Payer: Medicare Other | Source: Ambulatory Visit | Attending: Physician Assistant | Admitting: Physician Assistant

## 2018-06-10 ENCOUNTER — Telehealth: Payer: Self-pay | Admitting: *Deleted

## 2018-06-10 ENCOUNTER — Encounter (HOSPITAL_COMMUNITY): Payer: Self-pay | Admitting: Physician Assistant

## 2018-06-10 ENCOUNTER — Other Ambulatory Visit: Payer: Self-pay

## 2018-06-10 ENCOUNTER — Other Ambulatory Visit (HOSPITAL_COMMUNITY): Payer: Self-pay | Admitting: *Deleted

## 2018-06-10 ENCOUNTER — Ambulatory Visit (HOSPITAL_BASED_OUTPATIENT_CLINIC_OR_DEPARTMENT_OTHER)
Admission: RE | Admit: 2018-06-10 | Discharge: 2018-06-10 | Disposition: A | Discharge status: Home / Self Care | Payer: Medicare Other | Source: Ambulatory Visit | Attending: Physician Assistant | Admitting: Physician Assistant

## 2018-06-10 ENCOUNTER — Ambulatory Visit (HOSPITAL_COMMUNITY): Payer: Medicare Other

## 2018-06-10 VITALS — BP 112/64 | HR 46 | Ht 64.0 in | Wt 221.2 lb

## 2018-06-10 DIAGNOSIS — I4891 Unspecified atrial fibrillation: Secondary | ICD-10-CM

## 2018-06-10 DIAGNOSIS — Z7901 Long term (current) use of anticoagulants: Secondary | ICD-10-CM | POA: Diagnosis not present

## 2018-06-10 DIAGNOSIS — I480 Paroxysmal atrial fibrillation: Secondary | ICD-10-CM | POA: Insufficient documentation

## 2018-06-10 DIAGNOSIS — I10 Essential (primary) hypertension: Secondary | ICD-10-CM | POA: Diagnosis not present

## 2018-06-10 DIAGNOSIS — Z6837 Body mass index (BMI) 37.0-37.9, adult: Secondary | ICD-10-CM | POA: Diagnosis not present

## 2018-06-10 DIAGNOSIS — Z79899 Other long term (current) drug therapy: Secondary | ICD-10-CM | POA: Insufficient documentation

## 2018-06-10 DIAGNOSIS — E669 Obesity, unspecified: Secondary | ICD-10-CM | POA: Insufficient documentation

## 2018-06-10 DIAGNOSIS — R001 Bradycardia, unspecified: Secondary | ICD-10-CM | POA: Insufficient documentation

## 2018-06-10 LAB — BASIC METABOLIC PANEL
Anion gap: 9 (ref 5–15)
BUN: 15 mg/dL (ref 8–23)
CO2: 26 mmol/L (ref 22–32)
Calcium: 9.8 mg/dL (ref 8.9–10.3)
Chloride: 105 mmol/L (ref 98–111)
Creatinine, Ser: 0.71 mg/dL (ref 0.44–1.00)
GFR calc Af Amer: >60 mL/min (ref >60)
GFR calc non Af Amer: >60 mL/min (ref >60)
Glucose, Bld: 127 mg/dL — ABNORMAL HIGH (ref 70–99)
Potassium: 3.8 mmol/L (ref 3.5–5.1)
Sodium: 140 mmol/L (ref 135–145)

## 2018-06-10 LAB — ECHOCARDIOGRAM COMPLETE
Height: 64 in
Weight: 3539.2 oz

## 2018-06-10 MED ORDER — APIXABAN 5 MG PO TABS: 5.0000 mg | ORAL_TABLET | Freq: Two times a day (BID) | ORAL | 2 refills | Status: DC

## 2018-06-10 MED ORDER — METOPROLOL TARTRATE 25 MG PO TABS: 12.5000 mg | ORAL_TABLET | Freq: Two times a day (BID) | ORAL | 0 refills | Status: DC

## 2018-06-10 MED ORDER — POTASSIUM CHLORIDE CRYS ER 20 MEQ PO TBCR: 20.0000 meq | EXTENDED_RELEASE_TABLET | Freq: Every day | ORAL | 2 refills | Status: DC

## 2018-06-10 NOTE — Telephone Encounter (Signed)
Staff message sent to Gae Bon ok to schedule sleep study. No PA is required. Patient has medicare.

## 2018-06-10 NOTE — Progress Notes (Signed)
Primary Care Physician: Darcus Austin, MD (Inactive) Referring Physician: [facility]  Bridget [name] is a 73 y.o. female with a history of paroxysmal atrial fibrillation who presents for follow up in the [facility]. Patient was in her usual state of health until the early morning hours of 05/11/18 when she woke with heart racing, palpitations, and diaphoresis. No associated chest discomfort or SOB. She presented to the ER and was found to be in afib with RVR. Cardioversion was attempted x4 but was unsuccessful. Of note, her K+ was 2.9 and patient admits she had diarrhea for four days prior. She denies ever having symptoms like this before. She was started on Eliquis and metoprolol and discharged in rate controlled afib. Reports that she feels fine today with no further symptoms. Her daughter (who is a PA) palpated her pulse and noted it was regular. She does admit to snoring but denies significant alcohol use.   Today on follow up patient reports that she has been doing reasonably well. She has noticed some increased fatigue since starting BB but has not had any heart racing symptoms. Denies dizziness or syncope.  Today, she denies symptoms of chest pain, shortness of breath, orthopnea, PND, lower extremity edema, dizziness, presyncope, syncope, snoring, daytime somnolence, bleeding, or neurologic sequela. The patient is tolerating medications without difficulties and is otherwise without complaint today.    Atrial Fibrillation Risk Factors:  she does have symptoms of sleep apnea. she does not have a history of rheumatic fever. she does not have a history of alcohol use. The patient does not have a history of early familial atrial fibrillation or other arrhythmias.  she has a BMI of Body mass index is 37.97 kg/m.Marland Kitchen Filed Weights   06/10/18 1004  Weight: 100.3 kg    Family History  Problem Relation Age of Onset  . Heart disease Mother   . Diabetes Father         AODM  . Hypertension Father   . Cancer Father        LUNG  . Cancer Brother 25       BLADDER  . Breast cancer Sister 38     Atrial Fibrillation Management history:  Previous antiarrhythmic drugs: none Previous cardioversions: none Previous ablations: none CHADS2VASC score: 3 Anticoagulation history: Eliquis   Past Medical History:  Diagnosis Date  . Hypertension   . Seasonal allergies    Past Surgical History:  Procedure Laterality Date  . BREAST EXCISIONAL BIOPSY Left   . CHOLECYSTECTOMY  02/2010  . KNEE SURGERY    . OOPHORECTOMY  01/2003   RSO AT [facility]  . VAGINAL HYSTERECTOMY  01/2003   [facility] A&P REPAIR    Current Outpatient Medications  Medication Sig Dispense Refill  . apixaban (ELIQUIS) 5 MG TABS tablet Take 1 tablet (5 mg total) by mouth 2 (two) times daily. 180 tablet 2  . benazepril-hydrochlorthiazide (LOTENSIN HCT) 20-25 MG per tablet Take 1 tablet by mouth daily.    . calcium carbonate (OSCAL) 1500 (600 Ca) MG TABS tablet Take by mouth daily with breakfast.     . DILT-XR 180 MG 24 hr capsule Take 180 mg by mouth daily.    . metoprolol tartrate (LOPRESSOR) 25 MG tablet Take 1 tablet (25 mg total) by mouth 2 (two) times daily. 60 tablet 0  . Multiple Vitamin (MULTIVITAMIN WITH MINERALS) TABS tablet Take 1 tablet by mouth daily.    . potassium chloride SA (K-DUR) 20 MEQ tablet  Take 20 mEq by mouth once.    . pravastatin (PRAVACHOL) 20 MG tablet Take 20 mg by mouth at bedtime.     No current facility-administered medications for this encounter.     Allergies  Allergen Reactions  . Codeine Nausea Only    Social History   Socioeconomic History  . Marital status: Married    Spouse name: Not on file  . Number of children: Not on file  . Years of education: Not on file  . Highest education level: Not on file  Occupational History  . Not on file  Social Needs  . Financial resource strain: Not on file  . Food insecurity:    Worry: Not on file     Inability: Not on file  . Transportation needs:    Medical: Not on file    Non-medical: Not on file  Tobacco Use  . Smoking status: Never Smoker  . Smokeless tobacco: Never Used  Substance and Sexual Activity  . Alcohol use: Yes    Comment: SELDOM  . Drug use: No  . Sexual activity: Not on file    Comment: HYST  Lifestyle  . Physical activity:    Days per week: Not on file    Minutes per session: Not on file  . Stress: Not on file  Relationships  . Social connections:    Talks on phone: Not on file    Gets together: Not on file    Attends religious service: Not on file    Active member of club or organization: Not on file    Attends meetings of clubs or organizations: Not on file    Relationship status: Not on file  . Intimate partner violence:    Fear of current or ex partner: Not on file    Emotionally abused: Not on file    Physically abused: Not on file    Forced sexual activity: Not on file  Other Topics Concern  . Not on file  Social History Narrative  . Not on file     ROS- All systems are reviewed and negative except as per the HPI above.  Physical Exam: Vitals:   06/10/18 1004  BP: 112/64  Pulse: (!) 46  Weight: 100.3 kg  Height: 5\' 4"  (1.626 m)    GEN- The patient is well appearing obese elderly female, alert and oriented x 3 today.   HEENT-head normocephalic, atraumatic, sclera clear, conjunctiva pink, hearing intact, trachea midline. Lungs- Clear to ausculation bilaterally, normal work of breathing Heart- Regular rhythm, bradycardia, no murmurs, rubs or gallops  GI- soft, NT, ND, + BS Extremities- no clubbing, cyanosis, or edema MS- no significant deformity or atrophy Skin- no rash or lesion Psych- euthymic mood, full affect Neuro- strength and sensation are intact   Wt Readings from Last 3 Encounters:  06/10/18 100.3 kg  06/08/18 100.7 kg  05/12/18 100.7 kg    EKG today demonstrates sinus bradycardia HR 46, PR 176, QRS 84, QTc 413   Epic records are reviewed at length today  Assessment and Plan:  1. Paroxysmal atrial fibrillation The patient has paroxysmal atrial fibrillation.  She has not had any further episodes of heart racing. Does feel a little more fatigued with addition of BB. Will decrease metoprolol to 12.5 mg BID Continue Dilt 180 mg daily Continue Eliquis 5 mg BID Check Bmet today Echocardiogram today  This patients CHA2DS2-VASc Score and unadjusted Ischemic Stroke Rate (% per year) is equal to 3.2 % stroke rate/year from a  score of 3  Above score calculated as 1 point each if present [CHF, HTN, DM, Vascular=MI/PAD/Aortic Plaque, Age if 65-74, or Female] Above score calculated as 2 points each if present [Age > 75, or Stroke/TIA/TE]   2. Obesity Body mass index is 37.97 kg/m. Lifestyle modification was discussed at length including regular exercise and weight reduction.  3. Snoring The importance of adequate treatment of sleep apnea was discussed today in order to improve our ability to maintain sinus rhythm long term. Will arrange for sleep study.  4. HTN Stable, med changes as above.  5. Bradycardia Patient having more fatigue. No syncope or dizziness. No blocks on ECG. Decrease BB as above.  Follow up with AF clinic in 3 months.   [facility] [address] [place], [place] [address] [phone] 06/10/2018 10:15 AM

## 2018-06-10 NOTE — Telephone Encounter (Signed)
-----   Message from Iona Hansen, [place] sent at 06/10/2018 10:40 AM EDT ----- Regarding: sleep study needed Please contact pt to schedule sleep study for snoring and atrial fibrillation.  Thank you  Concepcion Living, CMA

## 2018-06-10 NOTE — Progress Notes (Signed)
  Echocardiogram 2D Echocardiogram has been performed.  Bridget [name] 06/10/2018, 11:49 AM

## 2018-06-10 NOTE — Patient Instructions (Signed)
Decrease metoprolol to 12.5 mg twice a day.  This is half of your current tablet  Refills have been sent to your pharmacy for your metoprolol and your eliquis  A referral has been put in for a sleep study.  They will call you to schedule this once it has been precerted through your insurance.    If you have any questions, please feel free to call us.

## 2018-06-10 NOTE — Telephone Encounter (Signed)
-----   Message from Iona Hansen, [place] sent at 06/10/2018 10:40 AM EDT ----- Regarding: sleep study needed Please contact pt to schedule sleep study for snoring and atrial fibrillation.  Thank you  Concepcion Living, CMA

## 2018-06-11 ENCOUNTER — Telehealth (HOSPITAL_COMMUNITY): Payer: Medicare Other | Admitting: Physician Assistant

## 2018-06-14 NOTE — Addendum Note (Signed)
Addended by: Freada Bergeron on: 06/14/2018 02:52 PM   Modules accepted: Orders

## 2018-06-22 ENCOUNTER — Telehealth: Payer: Self-pay | Admitting: *Deleted

## 2018-06-22 NOTE — Telephone Encounter (Signed)
-----   Message from Lauralee Evener, [place] sent at 06/10/2018 12:45 PM EDT ----- Regarding: RE: sleep study needed Ok to schedule sleep study. No PA is required. Patient has medicare. ----- Message ----- From: Iona Hansen, CMA Sent: 06/10/2018  10:40 AM EDT To: Freada Bergeron, CMA, Cv Div Sleep Studies Subject: sleep study needed                             Please contact pt to schedule sleep study for snoring and atrial fibrillation.  Thank you  Concepcion Living, CMA

## 2018-06-22 NOTE — Telephone Encounter (Signed)
Patient is scheduled for lab study on 07/15/18. Patient understands her sleep study will be done at [facility] sleep lab. Patient understands she will receive a sleep packet in a week or so. Patient understands to call if she does not receive the sleep packet in a timely manner. Patient agrees with treatment and thanked me for call.

## 2018-07-06 ENCOUNTER — Ambulatory Visit: Payer: Self-pay

## 2018-07-06 ENCOUNTER — Ambulatory Visit (INDEPENDENT_AMBULATORY_CARE_PROVIDER_SITE_OTHER): Payer: Medicare Other | Admitting: Family Medicine

## 2018-07-06 ENCOUNTER — Other Ambulatory Visit: Payer: Self-pay

## 2018-07-06 ENCOUNTER — Encounter: Payer: Self-pay | Admitting: Family Medicine

## 2018-07-06 VITALS — BP 118/60 | HR 53 | Ht 64.0 in | Wt 221.0 lb

## 2018-07-06 DIAGNOSIS — M75112 Incomplete rotator cuff tear or rupture of left shoulder, not specified as traumatic: Secondary | ICD-10-CM | POA: Diagnosis not present

## 2018-07-06 DIAGNOSIS — M25512 Pain in left shoulder: Secondary | ICD-10-CM | POA: Diagnosis not present

## 2018-07-06 DIAGNOSIS — G8929 Other chronic pain: Secondary | ICD-10-CM

## 2018-07-06 DIAGNOSIS — M19012 Primary osteoarthritis, left shoulder: Secondary | ICD-10-CM

## 2018-07-06 NOTE — Patient Instructions (Addendum)
Injected AC joint Arnica Lotion  See me again in 2 months

## 2018-07-06 NOTE — Progress Notes (Signed)
[name] [place], [place] [address] Phone: [phone] Subjective:   [name], am serving as a scribe for Dr. Hulan Saas.   CC: Left shoulder pain follow-up  MCN:[id number]   06/08/2018: Patient does have a partial tear of the left rotator cuff.  Possible some mild improvement since previous exam.  We discussed icing regimen.  Patient wants to hold on formal physical therapy or an intra-articular injection at this time.  Follow-up again in 4 to 5 weeks and will consider either if necessary.  Update 07/06/2018 Bridget [name] is a 73 y.o. female coming in with complaint of left shoulder pain. Patient states that she continues to have limits in her range of motion. Feels the same as last visit. Has been doing her stretches.  Patient states approximately 85% better.  Still some pain at night.     Past Medical History:  Diagnosis Date  . Hypertension   . Seasonal allergies    Past Surgical History:  Procedure Laterality Date  . BREAST EXCISIONAL BIOPSY Left   . CHOLECYSTECTOMY  02/2010  . KNEE SURGERY    . OOPHORECTOMY  01/2003   [facility]O AT [facility]  . VAGINAL HYSTERECTOMY  01/2003   [facility] A&P REPAIR   Social History   Socioeconomic History  . Marital status: Married    Spouse name: Not on file  . Number of children: Not on file  . Years of education: Not on file  . Highest education level: Not on file  Occupational History  . Not on file  Social Needs  . Financial resource strain: Not on file  . Food insecurity    Worry: Not on file    Inability: Not on file  . Transportation needs    Medical: Not on file    Non-medical: Not on file  Tobacco Use  . Smoking status: Never Smoker  . Smokeless tobacco: Never Used  Substance and Sexual Activity  . Alcohol use: Yes    Comment: SELDOM  . Drug use: No  . Sexual activity: Not on file    Comment: HYST  Lifestyle  . Physical activity    Days per week: Not on file   Minutes per session: Not on file  . Stress: Not on file  Relationships  . Social Herbalist on phone: Not on file    Gets together: Not on file    Attends religious service: Not on file    Active member of club or organization: Not on file    Attends meetings of clubs or organizations: Not on file    Relationship status: Not on file  Other Topics Concern  . Not on file  Social History Narrative  . Not on file   Allergies  Allergen Reactions  . Codeine Nausea Only   Family History  Problem Relation Age of Onset  . Heart disease Mother   . Diabetes Father        AODM  . Hypertension Father   . Cancer Father        LUNG  . Cancer Brother 22       BLADDER  . Breast cancer Sister 45     Current Outpatient Medications (Cardiovascular):  .  benazepril-hydrochlorthiazide (LOTENSIN HCT) 20-25 MG per tablet, Take 1 tablet by mouth daily. Marland Kitchen  DILT-XR 180 MG 24 hr capsule, Take 180 mg by mouth daily. .  metoprolol tartrate (LOPRESSOR) 25 MG tablet, Take 0.5 tablets (  12.5 mg total) by mouth 2 (two) times daily. .  pravastatin (PRAVACHOL) 20 MG tablet, Take 20 mg by mouth at bedtime.    Current Outpatient Medications (Hematological):  .  apixaban (ELIQUIS) 5 MG TABS tablet, Take 1 tablet (5 mg total) by mouth 2 (two) times daily.  Current Outpatient Medications (Other):  .  calcium carbonate (OSCAL) 1500 (600 Ca) MG TABS tablet, Take by mouth daily with breakfast.  .  Multiple Vitamin (MULTIVITAMIN WITH MINERALS) TABS tablet, Take 1 tablet by mouth daily. .  potassium chloride SA (K-DUR) 20 MEQ tablet, Take 1 tablet (20 mEq total) by mouth daily.    Past medical history, social, surgical and family history all reviewed in electronic medical record.  No pertanent information unless stated regarding to the chief complaint.   Review of Systems:  No headache, visual changes, nausea, vomiting, diarrhea, constipation, dizziness, abdominal pain, skin rash, fevers, chills,  night sweats, weight loss, swollen lymph nodes, body aches, joint swelling, , chest pain, shortness of breath, mood changes.  Positive muscle aches  Objective  Blood pressure 118/60, pulse (!) 53, height 5\' 4"  (1.626 m), weight 221 lb (100.2 kg), last menstrual period 01/07/2003, SpO2 97 %. Systems examined below as of    General: No apparent distress alert and oriented x3 mood and affect normal, dressed appropriately.  HEENT: Pupils equal, extraocular movements intact  Respiratory: Patient's speak in full sentences and does not appear short of breath  Cardiovascular: No lower extremity edema, non tender, no erythema  Skin: Warm dry intact with no signs of infection or rash on extremities or on axial skeleton.  Abdomen: Soft nontender  Neuro: Cranial nerves II through XII are intact, neurovascularly intact in all extremities with 2+ DTRs and 2+ pulses.  Lymph: No lymphadenopathy of posterior or anterior cervical chain or axillae bilaterally.  Gait normal with good balance and coordination.  MSK:  Non tender with full range of motion and good stability and symmetric strength and tone of  elbows, wrist, hip, knee and ankles bilaterally.  Left shoulder exam shows the patient does have improvement in range of motion.  4+ out of 5 strength with rotator cuff.  Positive crossover sign.  Negative O'Brien's.  Tender to palpation over the acromioclavicular joint still.  Procedure: Real-time Ultrasound Guided Injection of left acromioclavicular joint Device: GE Logiq Q7 Ultrasound guided injection is preferred based studies that show increased duration, increased effect, greater accuracy, decreased procedural pain, increased response rate, and decreased cost with ultrasound guided versus blind injection.  Verbal informed consent obtained.  Time-out conducted.  Noted no overlying erythema, induration, or other signs of local infection.  Skin prepped in a sterile fashion.  Local anesthesia: Topical Ethyl  chloride.  With sterile technique and under real time ultrasound guidance: With a 25-gauge half inch needle injected with 0.5 cc of 0.5% Marcaine and 0.5 cc of Kenalog 40 mg/ml Completed without difficulty  Pain immediately resolved suggesting accurate placement of the medication.  Advised to call if fevers/chills, erythema, induration, drainage, or persistent bleeding.  Images permanently stored and available for review in the ultrasound unit.  Impression: Technically successful ultrasound guided injection.    Impression and Recommendations:     This case required medical decision making of moderate complexity. The above documentation has been reviewed and is accurate and complete Lyndal Pulley, DO       Note: This dictation was prepared with Dragon dictation along with smaller phrase technology. Any transcriptional errors that result from  this process are unintentional.

## 2018-07-06 NOTE — Assessment & Plan Note (Signed)
Repeat injection given today.  Tolerated the procedure well.  Follow-up again in 2 months after doing conservative therapy.  Likely will do well with conservative therapy.

## 2018-07-06 NOTE — Assessment & Plan Note (Signed)
Improvement noted today.  No change in management.  Discussed the possibility of x-rays at follow-up

## 2018-07-07 ENCOUNTER — Other Ambulatory Visit: Payer: Self-pay | Admitting: Family Medicine

## 2018-07-07 DIAGNOSIS — Z1231 Encounter for screening mammogram for malignant neoplasm of breast: Secondary | ICD-10-CM

## 2018-07-12 ENCOUNTER — Other Ambulatory Visit (HOSPITAL_COMMUNITY): Payer: Medicare Other

## 2018-07-14 ENCOUNTER — Encounter (HOSPITAL_BASED_OUTPATIENT_CLINIC_OR_DEPARTMENT_OTHER): Payer: Medicare Other

## 2018-07-15 ENCOUNTER — Encounter (HOSPITAL_BASED_OUTPATIENT_CLINIC_OR_DEPARTMENT_OTHER): Payer: Medicare Other

## 2018-07-16 ENCOUNTER — Other Ambulatory Visit: Payer: Self-pay | Admitting: Internal Medicine

## 2018-07-19 ENCOUNTER — Other Ambulatory Visit (HOSPITAL_COMMUNITY)
Admission: RE | Admit: 2018-07-19 | Discharge: 2018-07-19 | Disposition: A | Discharge status: Home / Self Care | Payer: Medicare Other | Source: Ambulatory Visit | Attending: Cardiology | Admitting: Cardiology

## 2018-07-19 DIAGNOSIS — Z1159 Encounter for screening for other viral diseases: Secondary | ICD-10-CM | POA: Diagnosis not present

## 2018-07-20 LAB — SARS CORONAVIRUS 2 (TAT 6-24 HRS): SARS Coronavirus 2: NEGATIVE

## 2018-07-21 ENCOUNTER — Ambulatory Visit (HOSPITAL_BASED_OUTPATIENT_CLINIC_OR_DEPARTMENT_OTHER): Payer: Medicare Other | Attending: Nurse Practitioner | Admitting: Cardiology

## 2018-07-21 ENCOUNTER — Other Ambulatory Visit: Payer: Self-pay

## 2018-07-21 DIAGNOSIS — I4891 Unspecified atrial fibrillation: Secondary | ICD-10-CM | POA: Diagnosis not present

## 2018-07-21 DIAGNOSIS — G4733 Obstructive sleep apnea (adult) (pediatric): Secondary | ICD-10-CM

## 2018-07-21 DIAGNOSIS — I10 Essential (primary) hypertension: Secondary | ICD-10-CM | POA: Insufficient documentation

## 2018-07-22 ENCOUNTER — Telehealth: Payer: Self-pay | Admitting: *Deleted

## 2018-07-22 DIAGNOSIS — I4891 Unspecified atrial fibrillation: Secondary | ICD-10-CM

## 2018-07-22 DIAGNOSIS — I10 Essential (primary) hypertension: Secondary | ICD-10-CM

## 2018-07-22 NOTE — Telephone Encounter (Signed)
-----   Message from Sueanne Margarita, MD sent at 07/22/2018  4:11 PM EDT ----- Please let patient know that they have sleep apnea and recommend CPAP titration. Please set up titration in the sleep lab.

## 2018-07-22 NOTE — Procedures (Signed)
     Patient Name: Bridget [name], Bridget [name] Study Date:12/23/2016 07/21/2018 Gender: Female D.O.B: 1945/05/22 Age (years): 73 Referring Provider: Sherran Needs Height (inches): 57 Interpreting Physician: Fransico Him MD, ABSM Weight (lbs): 222 RPSGT: Carolin Coy BMI: 38 MRN: [id number] Neck Size: 13.75  CLINICAL INFORMATION Sleep Study Type: NPSG  Indication for sleep study: Hypertension, Obesity  Epworth Sleepiness Score: 4  SLEEP STUDY TECHNIQUE As per the AASM Manual for the Scoring of Sleep and Associated Events v2.3 (April 2016) with a hypopnea requiring 4% desaturations.  The channels recorded and monitored were frontal, central and occipital EEG, electrooculogram (EOG), submentalis EMG (chin), nasal and oral airflow, thoracic and abdominal wall motion, anterior tibialis EMG, snore microphone, electrocardiogram, and pulse oximetry.  MEDICATIONS Medications self-administered by patient taken the night of the study : N/A  SLEEP ARCHITECTURE The study was initiated at 10:02:37 PM and ended at 4:51:47 AM.  Sleep onset time was 23.9 minutes and the sleep efficiency was 80.7%. The total sleep time was 330 minutes.  Stage REM latency was 83.5 minutes.  The patient spent 10.9% of the night in stage N1 sleep, 78.3% in stage N2 sleep, 0.0% in stage N3 and 10.8% in REM.  Alpha intrusion was absent.  Supine sleep was 96.06%.  RESPIRATORY PARAMETERS The overall apnea/hypopnea index (AHI) was 11.1 per hour. There were 2 total apneas, including 2 obstructive, 0 central and 0 mixed apneas. There were 59 hypopneas and 140 RERAs.  The AHI during Stage REM sleep was 49.0 per hour.  AHI while supine was 11.5 per hour.  The mean oxygen saturation was 93.9%. The minimum SpO2 during sleep was 89.0%.  moderate snoring was noted during this study.  CARDIAC DATA The 2 lead EKG demonstrated sinus rhythm. The mean heart rate was 43.9 beats per minute. Other EKG findings include: None.   LEG MOVEMENT DATA The total PLMS were 0 with a resulting PLMS index of 0.0. Associated arousal with leg movement index was 0.0 .  IMPRESSIONS - Mild obstructive sleep apnea occurred during this study (AHI = 11.1/h) and severe during REM sleep (AHI = 49/hr).  - No significant central sleep apnea occurred during this study (CAI = 0.0/h). - The patient had minimal or no oxygen desaturation during the study (Min O2 = 89.0%) - The patient snored with moderate snoring volume. - No cardiac abnormalities were noted during this study. - Clinically significant periodic limb movements did not occur during sleep. No significant associated arousals.  DIAGNOSIS - Obstructive Sleep Apnea (327.23 [G47.33 ICD-10])  RECOMMENDATIONS - Therapeutic CPAP titration to determine optimal pressure required to alleviate sleep disordered breathing. - Positional therapy avoiding supine position during sleep. - Avoid alcohol, sedatives and other CNS depressants that may worsen sleep apnea and disrupt normal sleep architecture. - Sleep hygiene should be reviewed to assess factors that may improve sleep quality. - Weight management and regular exercise should be initiated or continued if appropriate.  [Electronically signed] 07/22/2018 03:59 PM  Fransico Him MD, ABSM Diplomate, American Board of Sleep Medicine

## 2018-07-22 NOTE — Telephone Encounter (Signed)
Informed patient of sleep study results and patient understanding was verbalized. Patient understands her sleep study showed they have sleep apnea and recommend CPAP titration. Pt is aware and agreeable to the results.  Titration sent to sleep pool

## 2018-07-23 ENCOUNTER — Telehealth: Payer: Self-pay | Admitting: *Deleted

## 2018-07-23 NOTE — Telephone Encounter (Signed)
-----   Message from Freada Bergeron, [place] sent at 07/22/2018  4:55 PM EDT ----- Regarding: precert  recommend CPAP titration.

## 2018-07-23 NOTE — Telephone Encounter (Signed)
Staff message sent to Bridget [name] ok to schedule CPAP titration. Patient has Medicare and does not require a PA.

## 2018-07-26 NOTE — Addendum Note (Signed)
Addended by: Freada Bergeron on: 07/26/2018 11:19 AM   Modules accepted: Orders

## 2018-07-27 ENCOUNTER — Telehealth: Payer: Self-pay | Admitting: *Deleted

## 2018-07-27 NOTE — Telephone Encounter (Signed)
-----   Message from Lauralee Evener, [place] sent at 07/23/2018  8:04 AM EDT ----- Regarding: RE: precert Patient has Medicare and does not require PA. ----- Message ----- From: Freada Bergeron, CMA Sent: 07/22/2018   4:55 PM EDT To: [facility]v [facility] Sleep Studies Subject: precert                                         recommend CPAP titration.

## 2018-07-27 NOTE — Telephone Encounter (Signed)
Patient is scheduled for CPAP Titration on 8/3. Patient understands her titration study will be done at [facility] sleep lab. Patient understands she will receive a letter in a week or so detailing appointment, date, time, and location. Patient understands to call if he does not receive the letter  in a timely manner. She is scheduled for COVID screening on 7/31 prior to titration.    Left detailed message on voicemail with date and time of titration and informed patient to call back to confirm or reschedule.

## 2018-07-28 ENCOUNTER — Ambulatory Visit
Admission: RE | Admit: 2018-07-28 | Discharge: 2018-07-28 | Disposition: A | Discharge status: Home / Self Care | Payer: Medicare Other | Source: Ambulatory Visit | Attending: Family Medicine | Admitting: Family Medicine

## 2018-07-28 ENCOUNTER — Other Ambulatory Visit: Payer: Self-pay

## 2018-07-28 DIAGNOSIS — Z1231 Encounter for screening mammogram for malignant neoplasm of breast: Secondary | ICD-10-CM | POA: Diagnosis not present

## 2018-07-29 ENCOUNTER — Ambulatory Visit: Payer: Self-pay

## 2018-07-29 ENCOUNTER — Encounter: Payer: Self-pay | Admitting: Family Medicine

## 2018-07-29 ENCOUNTER — Ambulatory Visit (INDEPENDENT_AMBULATORY_CARE_PROVIDER_SITE_OTHER): Payer: Medicare Other | Admitting: Family Medicine

## 2018-07-29 VITALS — BP 110/70 | HR 46 | Ht 64.0 in | Wt 221.8 lb

## 2018-07-29 DIAGNOSIS — M7601 Gluteal tendinitis, right hip: Secondary | ICD-10-CM | POA: Insufficient documentation

## 2018-07-29 DIAGNOSIS — M25551 Pain in right hip: Secondary | ICD-10-CM

## 2018-07-29 MED ORDER — GABAPENTIN 100 MG PO CAPS: 200.0000 mg | ORAL_CAPSULE | Freq: Every day | ORAL | 3 refills | Status: DC

## 2018-07-29 NOTE — Patient Instructions (Signed)
Gabapentin 200 mg at night Do exercises 3x/week Call us on Monday if not better ([phone] See me back in 2-3 weeks

## 2018-07-29 NOTE — Assessment & Plan Note (Signed)
Injected today.  Tolerated procedure well.  Concern lumbar radiculopathy.  Discussed icing regimen and home exercises, continue to ambulate with the aid of a cane at the moment.  Patient will increase activity as tolerated.  Follow-up 4 to 8 weeks

## 2018-07-29 NOTE — Progress Notes (Signed)
[name] [address] [place], [place] [address] Phone: [phone] Subjective:    I'm seeing this patient by the request  of:    I, Wendy Poet, LAT, ATC am serving as scribe for Dr. Hulan Saas.  CC: Right hip and back pain  LFY:[id number]  Bridget [name] is a 73 y.o. female coming in with complaint of R hip and lateral thigh pain x approximately 2 months.  She notes that she stepped off a retaining wall and "jarred" her hip.  She noted spasms initially which subsided but last weekend the pain and spasms returned in her R hip and lateral thigh.  She reports tingling in her R toes 4-5 but denies N/T in the R leg.  Pt denies any mechanical symptoms and denies any low back pain.  Onset- early June 2020 Location-R hip and lateral thigh Character-Sharp Aggravating factors-  Stepping down onto R leg; driving which causes tightening in R calf Reliving factors-  Therapies tried- Alternating ice/heat; Aspercreme; Tylenol arthritis Severity- 7-8/10     Past Medical History:  Diagnosis Date  . Hypertension   . Seasonal allergies    Past Surgical History:  Procedure Laterality Date  . BREAST EXCISIONAL BIOPSY Left   . CHOLECYSTECTOMY  02/2010  . KNEE SURGERY    . OOPHORECTOMY  01/2003   RSO AT [facility]  . VAGINAL HYSTERECTOMY  01/2003   [facility]O A&P REPAIR   Social History   Socioeconomic History  . Marital status: Married    Spouse name: Not on file  . Number of children: Not on file  . Years of education: Not on file  . Highest education level: Not on file  Occupational History  . Not on file  Social Needs  . Financial resource strain: Not on file  . Food insecurity    Worry: Not on file    Inability: Not on file  . Transportation needs    Medical: Not on file    Non-medical: Not on file  Tobacco Use  . Smoking status: Never Smoker  . Smokeless tobacco: Never Used  Substance and Sexual Activity  . Alcohol use: Yes    Comment:  SELDOM  . Drug use: No  . Sexual activity: Not on file    Comment: HYST  Lifestyle  . Physical activity    Days per week: Not on file    Minutes per session: Not on file  . Stress: Not on file  Relationships  . Social Herbalist on phone: Not on file    Gets together: Not on file    Attends religious service: Not on file    Active member of club or organization: Not on file    Attends meetings of clubs or organizations: Not on file    Relationship status: Not on file  Other Topics Concern  . Not on file  Social History Narrative  . Not on file   Allergies  Allergen Reactions  . Codeine Nausea Only   Family History  Problem Relation Age of Onset  . Heart disease Mother   . Diabetes Father        AODM  . Hypertension Father   . Cancer Father        LUNG  . Cancer Brother 24       BLADDER  . Breast cancer Sister 62     Current Outpatient Medications (Cardiovascular):  .  benazepril-hydrochlorthiazide (LOTENSIN HCT) 20-25 MG per tablet, Take  1 tablet by mouth daily. Marland Kitchen  DILT-XR 180 MG 24 hr capsule, Take 180 mg by mouth daily. .  metoprolol tartrate (LOPRESSOR) 25 MG tablet, Take 0.5 tablets (12.5 mg total) by mouth 2 (two) times daily. .  pravastatin (PRAVACHOL) 20 MG tablet, Take 20 mg by mouth at bedtime.    Current Outpatient Medications (Hematological):  .  apixaban (ELIQUIS) 5 MG TABS tablet, Take 1 tablet (5 mg total) by mouth 2 (two) times daily.  Current Outpatient Medications (Other):  .  calcium carbonate (OSCAL) 1500 (600 Ca) MG TABS tablet, Take by mouth daily with breakfast.  .  Multiple Vitamin (MULTIVITAMIN WITH MINERALS) TABS tablet, Take 1 tablet by mouth daily. .  potassium chloride SA (K-DUR) 20 MEQ tablet, Take 1 tablet (20 mEq total) by mouth daily. Marland Kitchen  gabapentin (NEURONTIN) 100 MG capsule, Take 2 capsules (200 mg total) by mouth at bedtime.    Past medical history, social, surgical and family history all reviewed in electronic  medical record.  No pertanent information unless stated regarding to the chief complaint.   Review of Systems:  No headache, visual changes, nausea, vomiting, diarrhea, constipation, dizziness, abdominal pain, skin rash, fevers, chills, night sweats, weight loss, swollen lymph nodes, body aches, joint swelling,  chest pain, shortness of breath, mood changes.  Positive muscle aches  Objective  Blood pressure 110/70, pulse (!) 46, height 5\' 4"  (1.626 m), weight 221 lb 12.8 oz (100.6 kg), last menstrual period 01/07/2003, SpO2 97 %.    General: No apparent distress alert and oriented x3 mood and affect normal, dressed appropriately.  HEENT: Pupils equal, extraocular movements intact  Respiratory: Patient's speak in full sentences and does not appear short of breath  Cardiovascular: Trace lower extremity edema, non tender, no erythema  Skin: Warm dry intact with no signs of infection or rash on extremities or on axial skeleton.  Abdomen: Soft nontender  Neuro: Cranial nerves II through XII are intact, neurovascularly intact in all extremities with 2+ DTRs and 2+ pulses.  Lymph: No lymphadenopathy of posterior or anterior cervical chain or axillae bilaterally.  Gait antalgic MSK:  tender with mild limited sorry nose me range of motion and good stability and symmetric strength and tone of shoulders, elbows, wrist, , knee and ankles bilaterally.   Right hip exam is severely tender to palpation over the greater trochanteric in the lateral aspect of the gluteal tendon.  4-5 strength of the hip abductors.  Patient cannot do Corky Sox secondary to discomfort and pain.  Mild positive straight leg test at 20 degrees of forward flexion.  No back tenderness though but patient does have degenerative scoliosis noted of the lumbar spine   Procedure: Real-time Ultrasound Guided Injection of right gluteal tendon sheath secondary to patient's body habitus Device: GE Logiq Q7 Ultrasound guided injection is preferred  based studies that show increased duration, increased effect, greater accuracy, decreased procedural pain, increased response rate, and decreased cost with ultrasound guided versus blind injection.  Verbal informed consent obtained.  Time-out conducted.  Noted no overlying erythema, induration, or other signs of local infection.  Skin prepped in a sterile fashion.  Local anesthesia: Topical Ethyl chloride.  With sterile technique and under real time ultrasound guidance:  Greater trochanteric area was visualized and patient's bursa was noted. A 22-gauge 3 inch needle was inserted and 4 cc of 0.5% Marcaine and 1 cc of Kenalog 40 mg/dL was injected. Pictures taken Completed without difficulty  Pain immediately resolved suggesting accurate placement of the  medication.  Advised to call if fevers/chills, erythema, induration, drainage, or persistent bleeding.  Images permanently stored and available for review in the ultrasound unit.  Impression: Technically successful ultrasound guided injection.     Impression and Recommendations:     This case required medical decision making of moderate complexity. The above documentation has been reviewed and is accurate and complete Lyndal Pulley, DO       Note: This dictation was prepared with Dragon dictation along with smaller phrase technology. Any transcriptional errors that result from this process are unintentional.

## 2018-08-02 ENCOUNTER — Other Ambulatory Visit (HOSPITAL_COMMUNITY): Payer: Self-pay | Admitting: Physician Assistant

## 2018-08-06 ENCOUNTER — Other Ambulatory Visit (HOSPITAL_COMMUNITY): Payer: Medicare Other

## 2018-08-09 ENCOUNTER — Encounter (HOSPITAL_BASED_OUTPATIENT_CLINIC_OR_DEPARTMENT_OTHER): Payer: Medicare Other

## 2018-08-12 ENCOUNTER — Ambulatory Visit (INDEPENDENT_AMBULATORY_CARE_PROVIDER_SITE_OTHER): Payer: Medicare Other | Admitting: Family Medicine

## 2018-08-12 ENCOUNTER — Encounter: Payer: Self-pay | Admitting: Family Medicine

## 2018-08-12 ENCOUNTER — Ambulatory Visit (INDEPENDENT_AMBULATORY_CARE_PROVIDER_SITE_OTHER)
Admission: RE | Admit: 2018-08-12 | Discharge: 2018-08-12 | Disposition: A | Discharge status: Home / Self Care | Payer: Medicare Other | Source: Ambulatory Visit | Attending: Family Medicine | Admitting: Family Medicine

## 2018-08-12 ENCOUNTER — Other Ambulatory Visit: Payer: Self-pay

## 2018-08-12 VITALS — BP 132/90 | HR 53 | Ht 64.0 in

## 2018-08-12 DIAGNOSIS — M7601 Gluteal tendinitis, right hip: Secondary | ICD-10-CM

## 2018-08-12 DIAGNOSIS — M5416 Radiculopathy, lumbar region: Secondary | ICD-10-CM

## 2018-08-12 DIAGNOSIS — M545 Low back pain: Secondary | ICD-10-CM | POA: Diagnosis not present

## 2018-08-12 NOTE — Patient Instructions (Addendum)
Good to see you Xray downstairs Calf compression socks 1/6 inch heel lift 100mg  of gabapentin nightly but can take up to 300mg  if extreme pain  If bad pain, Duexis 1/2 tab, 2x a day for 3 days if needed See me again in 4-5 weeks

## 2018-08-12 NOTE — Assessment & Plan Note (Signed)
Patient is significant better after the injection.  I am still concerned that the patient is having more of a lumbar radiculopathy causing some mid calf pain.  We discussed the calf compression, gabapentin on a more regular basis, home exercises given.  X-rays of the lumbar spine ordered today as well.  Discussed which activities of doing which wants to avoid.  Patient will follow-up with me again 4 weeks.  Advanced imaging is necessary

## 2018-08-12 NOTE — Progress Notes (Signed)
[name] [place], [place] [address] Phone: [phone] Subjective:   [name], am serving as a scribe for Dr. Hulan Saas.  I'm seeing this patient by the request  of:    CC: Calf pain, right hip pain follow-up  HYQ:[id number]   07/29/2018 Injected today.  Tolerated procedure well.  Concern lumbar radiculopathy.  Discussed icing regimen and home exercises, continue to ambulate with the aid of a cane at the moment.  Patient will increase activity as tolerated.  Follow-up 4 to 8 weeks  Update 8/62020 Bridget [name] is a 73 y.o. female coming in with complaint of right hip pain. Patient states that her hip pain has subsided but she now notices pain in the right calf. Pain is a constant ache. Pain increases with movement. Uses a cane as needed especially when walking the dog.  Patient states that the calf can sometimes cramp on her.  Does not know any rhyme or reason.  Sometimes seems to be with activity and sometimes not.  If she takes the gabapentin sleeps more comfortably.  Patient states that the hip pain though since the injection is 85 to 90% better.     Past Medical History:  Diagnosis Date  . Hypertension   . Seasonal allergies    Past Surgical History:  Procedure Laterality Date  . BREAST EXCISIONAL BIOPSY Left   . CHOLECYSTECTOMY  02/2010  . KNEE SURGERY    . OOPHORECTOMY  01/2003   RSO AT [facility]  . VAGINAL HYSTERECTOMY  01/2003   [facility] A&P REPAIR   Social History   Socioeconomic History  . Marital status: Married    Spouse name: Not on file  . Number of children: Not on file  . Years of education: Not on file  . Highest education level: Not on file  Occupational History  . Not on file  Social Needs  . Financial resource strain: Not on file  . Food insecurity    Worry: Not on file    Inability: Not on file  . Transportation needs    Medical: Not on file    Non-medical: Not on file  Tobacco Use  .  Smoking status: Never Smoker  . Smokeless tobacco: Never Used  Substance and Sexual Activity  . Alcohol use: Yes    Comment: SELDOM  . Drug use: No  . Sexual activity: Not on file    Comment: HYST  Lifestyle  . Physical activity    Days per week: Not on file    Minutes per session: Not on file  . Stress: Not on file  Relationships  . Social Herbalist on phone: Not on file    Gets together: Not on file    Attends religious service: Not on file    Active member of club or organization: Not on file    Attends meetings of clubs or organizations: Not on file    Relationship status: Not on file  Other Topics Concern  . Not on file  Social History Narrative  . Not on file   Allergies  Allergen Reactions  . Codeine Nausea Only   Family History  Problem Relation Age of Onset  . Heart disease Mother   . Diabetes Father        AODM  . Hypertension Father   . Cancer Father        LUNG  . Cancer Brother 77  BLADDER  . Breast cancer Sister 2     Current Outpatient Medications (Cardiovascular):  .  benazepril-hydrochlorthiazide (LOTENSIN HCT) 20-25 MG per tablet, Take 1 tablet by mouth daily. Marland Kitchen  DILT-XR 180 MG 24 hr capsule, Take 180 mg by mouth daily. .  metoprolol tartrate (LOPRESSOR) 25 MG tablet, TAKE 1/2 TABLET BY MOUTH 2 TIMES DAILY .  pravastatin (PRAVACHOL) 20 MG tablet, Take 20 mg by mouth at bedtime.    Current Outpatient Medications (Hematological):  .  apixaban (ELIQUIS) 5 MG TABS tablet, Take 1 tablet (5 mg total) by mouth 2 (two) times daily.  Current Outpatient Medications (Other):  .  calcium carbonate (OSCAL) 1500 (600 Ca) MG TABS tablet, Take by mouth daily with breakfast.  .  gabapentin (NEURONTIN) 100 MG capsule, Take 2 capsules (200 mg total) by mouth at bedtime. .  Multiple Vitamin (MULTIVITAMIN WITH MINERALS) TABS tablet, Take 1 tablet by mouth daily. .  potassium chloride SA (K-DUR) 20 MEQ tablet, Take 1 tablet (20 mEq total) by  mouth daily.    Past medical history, social, surgical and family history all reviewed in electronic medical record.  No pertanent information unless stated regarding to the chief complaint.   Review of Systems:  No headache, visual changes, nausea, vomiting, diarrhea, constipation, dizziness, abdominal pain, skin rash, fevers, chills, night sweats, weight loss, swollen lymph nodes, body aches, joint swelling, muscle aches, chest pain, shortness of breath, mood changes.  Positive muscle aches  Objective  Last menstrual period 01/07/2003. Blood pressure 132/90, pulse (!) 53, height 5\' 4"  (1.626 m), last menstrual period 01/07/2003, SpO2 98 %.     General: No apparent distress alert and oriented x3 mood and affect normal, dressed appropriately.  HEENT: Pupils equal, extraocular movements intact  Respiratory: Patient's speak in full sentences and does not appear short of breath  Cardiovascular: Trace lower extremity edema, non tender, no erythema  Skin: Warm dry intact with no signs of infection or rash on extremities or on axial skeleton.  Abdomen: Soft nontender  Neuro: Cranial nerves II through XII are intact, neurovascularly intact in all extremities with 2+ DTRs and 2+ pulses.  Lymph: No lymphadenopathy of posterior or anterior cervical chain or axillae bilaterally.  Gait antalgic MSK:  tender with mild limited range of motion and good stability and symmetric strength and tone of shoulders, elbows, wrist, , knee and ankles bilaterally.  Arthritic changes of multiple joints  Back exam shows moderate to severe tenderness over the sacroiliac joint right greater than left.  Patient does have a positive straight leg with radicular symptoms in the L5 and S1 distribution on the right side at 25 degrees of forward flexion.  Patient does have some weakness with dorsiflexion with 4 out of 5 strength of the foot compared to the contralateral side.  Deep tendon reflexes though are intact.      [id number]; 15 additional minutes spent for Therapeutic exercises as stated in above notes.  This included exercises focusing on stretching, strengthening, with significant focus on eccentric aspects.   Long term goals include an improvement in range of motion, strength, endurance as well as avoiding reinjury. Patient's frequency would include in 1-2 times a day, 3-5 times a week for a duration of 6-12 weeks. Low back exercises that included:  Pelvic tilt/bracing instruction to focus on control of the pelvic girdle and lower abdominal muscles  Glute strengthening exercises, focusing on proper firing of the glutes without engaging the low back muscles Proper stretching techniques for maximum  relief for the hamstrings, hip flexors, low back and some rotation where tolerated   Proper technique shown and discussed handout in great detail with ATC.  All questions were discussed and answered.   Impression and Recommendations:     This case required medical decision making of moderate complexity. The above documentation has been reviewed and is accurate and complete Jacqualin Combes       Note: This dictation was prepared with Dragon dictation along with smaller phrase technology. Any transcriptional errors that result from this process are unintentional.

## 2018-08-12 NOTE — Assessment & Plan Note (Signed)
Likely contributing to the calf pain.  Encouraged the gabapentin regularly, no likelihood of a DVT with patient on chronic anticoagulation.  Patient does have a positive straight leg test which is concerning.  We will follow-up again in 4 to 8 weeks

## 2018-08-16 ENCOUNTER — Other Ambulatory Visit (HOSPITAL_COMMUNITY)
Admission: RE | Admit: 2018-08-16 | Discharge: 2018-08-16 | Disposition: A | Discharge status: Home / Self Care | Payer: Medicare Other | Source: Ambulatory Visit | Attending: Cardiology | Admitting: Cardiology

## 2018-08-16 DIAGNOSIS — Z01812 Encounter for preprocedural laboratory examination: Secondary | ICD-10-CM | POA: Diagnosis not present

## 2018-08-16 DIAGNOSIS — Z20828 Contact with and (suspected) exposure to other viral communicable diseases: Secondary | ICD-10-CM | POA: Diagnosis not present

## 2018-08-16 LAB — SARS CORONAVIRUS 2 (TAT 6-24 HRS): SARS Coronavirus 2: NEGATIVE

## 2018-08-18 ENCOUNTER — Other Ambulatory Visit: Payer: Self-pay

## 2018-08-18 ENCOUNTER — Ambulatory Visit (HOSPITAL_BASED_OUTPATIENT_CLINIC_OR_DEPARTMENT_OTHER): Payer: Medicare Other | Attending: Cardiology | Admitting: Cardiology

## 2018-08-18 VITALS — Ht 64.0 in | Wt 222.0 lb

## 2018-08-18 DIAGNOSIS — I10 Essential (primary) hypertension: Secondary | ICD-10-CM | POA: Diagnosis not present

## 2018-08-18 DIAGNOSIS — I4891 Unspecified atrial fibrillation: Secondary | ICD-10-CM | POA: Diagnosis not present

## 2018-08-18 DIAGNOSIS — G4733 Obstructive sleep apnea (adult) (pediatric): Secondary | ICD-10-CM | POA: Diagnosis not present

## 2018-08-30 ENCOUNTER — Telehealth: Payer: Self-pay | Admitting: *Deleted

## 2018-08-30 NOTE — Telephone Encounter (Signed)
Informed patient of sleep study results and patient understanding was verbalized. Patient understands her sleep study showed they had a successful PAP titration and orders are in EPIC. Please set up 10 week OV with me.  Upon patient request DME selection is [facility]. Patient understands she will be contacted by [facility] to set up her cpap. Patient understands to call if [facility] does not contact her with new setup in a timely manner. Patient understands they will be called once confirmation has been received from [facility] that they have received their new machine to schedule 10 week follow up appointment.  [facility] notified of new cpap order  Please add to airview Patient was grateful for the call and thanked me.

## 2018-08-30 NOTE — Procedures (Signed)
    Patient Name: Bridget [name], Bridget [name] Date: 08/18/2018 Gender: Female D.O.B: 06/16/1945 Age (years): 73 Referring Provider: Sherran Needs Height (inches): 85 Interpreting Physician: Fransico Him MD, ABSM Weight (lbs): 222 RPSGT: Laren Everts BMI: 38 MRN: [id number] Neck Size: 13.75  CLINICAL INFORMATION The patient is referred for a CPAP titration to treat sleep apnea.  SLEEP STUDY TECHNIQUE As per the AASM Manual for the Scoring of Sleep and Associated Events v2.3 (April 2016) with a hypopnea requiring 4% desaturations.  The channels recorded and monitored were frontal, central and occipital EEG, electrooculogram (EOG), submentalis EMG (chin), nasal and oral airflow, thoracic and abdominal wall motion, anterior tibialis EMG, snore microphone, electrocardiogram, and pulse oximetry. Continuous positive airway pressure (CPAP) was initiated at the beginning of the study and titrated to treat sleep-disordered breathing.  MEDICATIONS Medications self-administered by patient taken the night of the study : N/A  TECHNICIAN COMMENTS Comments added by technician: NONE Comments added by scorer: N/A  RESPIRATORY PARAMETERS Optimal PAP Pressure (cm): 6  AHI at Optimal Pressure (/hr):1.4 Overall Minimal O2 (%):88.0  Supine % at Optimal Pressure (%):73 Minimal O2 at Optimal Pressure (%): 88.0   SLEEP ARCHITECTURE The study was initiated at 10:36:44 PM and ended at 5:32:11 AM.  Sleep onset time was 22.6 minutes and the sleep efficiency was 88.7%. The total sleep time was 368.5 minutes.  The patient spent 3.9% of the night in stage N1 sleep, 76.7% in stage N2 sleep, 0.0% in stage N3 and 19.4% in REM.Stage REM latency was 144.0 minutes  Wake after sleep onset was 24.4. Alpha intrusion was absent. Supine sleep was 60.92%.  CARDIAC DATA The 2 lead EKG demonstrated sinus rhythm. The mean heart rate was 44.0 beats per minute. Other EKG findings include: PVCs.  LEG MOVEMENT DATA  The total Periodic Limb Movements of Sleep (PLMS) were 0. The PLMS index was 0.0. A PLMS index of <15 is considered normal in adults.  IMPRESSIONS - The optimal PAP pressure was 6 cm of water. - Central sleep apnea was not noted during this titration (CAI = 1.1/h). - Mild oxygen desaturations were observed during this titration (min O2 = 88.0%). - The patient snored with soft snoring volume during this titration study. - 2-lead EKG demonstrated: PVCs - Clinically significant periodic limb movements were not noted during this study. Arousals associated with PLMs were rare.  DIAGNOSIS - Obstructive Sleep Apnea (327.23 [G47.33 ICD-10])  RECOMMENDATIONS - Trial of CPAP therapy on 6 cm H2O with a Small size Fisher&Paykel Nasal Mask Eson2 mask and heated humidification. - Avoid alcohol, sedatives and other CNS depressants that may worsen sleep apnea and disrupt normal sleep architecture. - Sleep hygiene should be reviewed to assess factors that may improve sleep quality. - Weight management and regular exercise should be initiated or continued. - Return to Sleep Center for re-evaluation after 10 weeks of therapy  [Electronically signed] 08/30/2018 02:37 PM  Fransico Him MD, ABSM Diplomate, American Board of Sleep Medicine

## 2018-08-30 NOTE — Telephone Encounter (Signed)
-----   Message from Sueanne Margarita, MD sent at 08/30/2018  2:41 PM EDT ----- Please let patient know that they had a successful PAP titration and let DME know that orders are in EPIC.  Please set up 10 week OV with me.

## 2018-08-31 ENCOUNTER — Ambulatory Visit: Payer: Medicare Other | Admitting: Family Medicine

## 2018-09-15 ENCOUNTER — Other Ambulatory Visit: Payer: Self-pay

## 2018-09-15 ENCOUNTER — Encounter (HOSPITAL_COMMUNITY): Payer: Self-pay | Admitting: Physician Assistant

## 2018-09-15 ENCOUNTER — Ambulatory Visit (HOSPITAL_COMMUNITY)
Admission: RE | Admit: 2018-09-15 | Discharge: 2018-09-15 | Disposition: A | Discharge status: Home / Self Care | Payer: Medicare Other | Source: Ambulatory Visit | Attending: Physician Assistant | Admitting: Physician Assistant

## 2018-09-15 VITALS — BP 118/82 | HR 57 | Ht 64.0 in | Wt 223.4 lb

## 2018-09-15 DIAGNOSIS — Z833 Family history of diabetes mellitus: Secondary | ICD-10-CM | POA: Insufficient documentation

## 2018-09-15 DIAGNOSIS — Z79899 Other long term (current) drug therapy: Secondary | ICD-10-CM | POA: Diagnosis not present

## 2018-09-15 DIAGNOSIS — Z801 Family history of malignant neoplasm of trachea, bronchus and lung: Secondary | ICD-10-CM | POA: Insufficient documentation

## 2018-09-15 DIAGNOSIS — R001 Bradycardia, unspecified: Secondary | ICD-10-CM | POA: Insufficient documentation

## 2018-09-15 DIAGNOSIS — Z885 Allergy status to narcotic agent status: Secondary | ICD-10-CM | POA: Diagnosis not present

## 2018-09-15 DIAGNOSIS — I480 Paroxysmal atrial fibrillation: Secondary | ICD-10-CM | POA: Insufficient documentation

## 2018-09-15 DIAGNOSIS — E669 Obesity, unspecified: Secondary | ICD-10-CM | POA: Insufficient documentation

## 2018-09-15 DIAGNOSIS — I10 Essential (primary) hypertension: Secondary | ICD-10-CM | POA: Diagnosis not present

## 2018-09-15 DIAGNOSIS — G4733 Obstructive sleep apnea (adult) (pediatric): Secondary | ICD-10-CM | POA: Diagnosis not present

## 2018-09-15 DIAGNOSIS — Z803 Family history of malignant neoplasm of breast: Secondary | ICD-10-CM | POA: Insufficient documentation

## 2018-09-15 DIAGNOSIS — I4891 Unspecified atrial fibrillation: Secondary | ICD-10-CM | POA: Diagnosis present

## 2018-09-15 DIAGNOSIS — Z8052 Family history of malignant neoplasm of bladder: Secondary | ICD-10-CM | POA: Diagnosis not present

## 2018-09-15 DIAGNOSIS — Z8249 Family history of ischemic heart disease and other diseases of the circulatory system: Secondary | ICD-10-CM | POA: Diagnosis not present

## 2018-09-15 DIAGNOSIS — Z6838 Body mass index (BMI) 38.0-38.9, adult: Secondary | ICD-10-CM | POA: Insufficient documentation

## 2018-09-15 DIAGNOSIS — Z7901 Long term (current) use of anticoagulants: Secondary | ICD-10-CM | POA: Diagnosis not present

## 2018-09-15 NOTE — Progress Notes (Signed)
Primary Care Physician: Darcus Austin, MD (Inactive) Referring Physician: [facility]  Bridget [name] is a 73 y.o. female with a history of paroxysmal atrial fibrillation who presents for follow up in the [facility]. Patient was in her usual state of health until the early morning hours of 05/11/18 when she woke with heart racing, palpitations, and diaphoresis. No associated chest discomfort or SOB. She presented to the ER and was found to be in afib with RVR. Cardioversion was attempted x4 but was unsuccessful. Of note, her K+ was 2.9 and patient admits she had diarrhea for four days prior. She denies ever having symptoms like this before. She was started on Eliquis and metoprolol and discharged in rate controlled afib. Reports that she feels fine today with no further symptoms. Her daughter (who is a PA) palpated her pulse and noted it was regular. She does admit to snoring but denies significant alcohol use.   On follow up today, patient reports that she has done very well. She has not had any further heart racing episodes. Her energy level has also improved on lower dose of BB. Patient has been diagnosed with OSA and is now on CPAP therapy.   Today, she denies symptoms of chest pain, shortness of breath, orthopnea, PND, lower extremity edema, dizziness, presyncope, syncope, snoring, daytime somnolence, bleeding, or neurologic sequela. The patient is tolerating medications without difficulties and is otherwise without complaint today.    Atrial Fibrillation Risk Factors:  she does have a diagnosis of sleep apnea. She is compliant with CPAP therapy.  she does not have a history of rheumatic fever. she does not have a history of alcohol use. The patient does not have a history of early familial atrial fibrillation or other arrhythmias.  she has a BMI of Body mass index is 38.35 kg/m.Marland Kitchen Filed Weights   09/15/18 1022  Weight: 101.3 kg    Family History   Problem Relation Age of Onset  . Heart disease Mother   . Diabetes Father        AODM  . Hypertension Father   . Cancer Father        LUNG  . Cancer Brother 47       BLADDER  . Breast cancer Sister 68     Atrial Fibrillation Management history:  Previous antiarrhythmic drugs: none Previous cardioversions: none Previous ablations: none CHADS2VASC score: 3 Anticoagulation history: Eliquis   Past Medical History:  Diagnosis Date  . Hypertension   . Seasonal allergies    Past Surgical History:  Procedure Laterality Date  . BREAST EXCISIONAL BIOPSY Left   . CHOLECYSTECTOMY  02/2010  . KNEE SURGERY    . OOPHORECTOMY  01/2003   RSO AT [facility]  . VAGINAL HYSTERECTOMY  01/2003   [facility] A&P REPAIR    Current Outpatient Medications  Medication Sig Dispense Refill  . apixaban (ELIQUIS) 5 MG TABS tablet Take 1 tablet (5 mg total) by mouth 2 (two) times daily. 180 tablet 2  . benazepril-hydrochlorthiazide (LOTENSIN HCT) 20-25 MG per tablet Take 1 tablet by mouth daily.    . calcium carbonate (OSCAL) 1500 (600 Ca) MG TABS tablet Take by mouth daily with breakfast.     . DILT-XR 180 MG 24 hr capsule Take 180 mg by mouth daily.    Marland Kitchen gabapentin (NEURONTIN) 100 MG capsule Take 2 capsules (200 mg total) by mouth at bedtime. (Patient taking differently: Take 100 mg by mouth at bedtime. ) 90 capsule  3  . metoprolol tartrate (LOPRESSOR) 25 MG tablet TAKE 1/2 TABLET BY MOUTH 2 TIMES DAILY 90 tablet 2  . Multiple Vitamin (MULTIVITAMIN WITH MINERALS) TABS tablet Take 1 tablet by mouth daily.    . potassium chloride SA (K-DUR) 20 MEQ tablet Take 1 tablet (20 mEq total) by mouth daily. 90 tablet 2  . pravastatin (PRAVACHOL) 20 MG tablet Take 20 mg by mouth at bedtime.     No current facility-administered medications for this encounter.     Allergies  Allergen Reactions  . Codeine Nausea Only    Social History   Socioeconomic History  . Marital status: Married    Spouse name: Not on  file  . Number of children: Not on file  . Years of education: Not on file  . Highest education level: Not on file  Occupational History  . Not on file  Social Needs  . Financial resource strain: Not on file  . Food insecurity    Worry: Not on file    Inability: Not on file  . Transportation needs    Medical: Not on file    Non-medical: Not on file  Tobacco Use  . Smoking status: Never Smoker  . Smokeless tobacco: Never Used  Substance and Sexual Activity  . Alcohol use: Yes    Comment: SELDOM  . Drug use: No  . Sexual activity: Not on file    Comment: HYST  Lifestyle  . Physical activity    Days per week: Not on file    Minutes per session: Not on file  . Stress: Not on file  Relationships  . Social Herbalist on phone: Not on file    Gets together: Not on file    Attends religious service: Not on file    Active member of club or organization: Not on file    Attends meetings of clubs or organizations: Not on file    Relationship status: Not on file  . Intimate partner violence    Fear of current or ex partner: Not on file    Emotionally abused: Not on file    Physically abused: Not on file    Forced sexual activity: Not on file  Other Topics Concern  . Not on file  Social History Narrative  . Not on file     ROS- All systems are reviewed and negative except as per the HPI above.  Physical Exam: Vitals:   09/15/18 1022  BP: 118/82  Pulse: (!) 57  Weight: 101.3 kg  Height: 5\' 4"  (1.626 m)    GEN- The patient is well appearing obese elederly female, alert and oriented x 3 today.   HEENT-head normocephalic, atraumatic, sclera clear, conjunctiva pink, hearing intact, trachea midline. Lungs- Clear to ausculation bilaterally, normal work of breathing Heart- Regular rate and rhythm, no murmurs, rubs or gallops  GI- soft, NT, ND, + BS Extremities- no clubbing, cyanosis. Trace edema L>R MS- no significant deformity or atrophy Skin- no rash or lesion  Psych- euthymic mood, full affect Neuro- strength and sensation are intact   Wt Readings from Last 3 Encounters:  09/15/18 101.3 kg  08/18/18 100.7 kg  07/29/18 100.6 kg    EKG today demonstrates SB HR 57, PR 168, QRS 74, QTc 418  Epic records are reviewed at length today  Assessment and Plan:  1. Paroxysmal atrial fibrillation Patient appears to be maintaining SR. She has not had any further episodes of heart racing. Continue metoprolol to 12.5  mg BID Continue Dilt 180 mg daily Continue Eliquis 5 mg BID Lifestyle modification as below.  This patients CHA2DS2-VASc Score and unadjusted Ischemic Stroke Rate (% per year) is equal to 3.2 % stroke rate/year from a score of 3  Above score calculated as 1 point each if present [CHF, HTN, DM, Vascular=MI/PAD/Aortic Plaque, Age if 65-74, or Female] Above score calculated as 2 points each if present [Age > 75, or Stroke/TIA/TE]   2. Obesity Body mass index is 38.35 kg/m. Lifestyle modification was discussed and encouraged including regular physical activity and weight reduction.  3. OSA The importance of adequate treatment of sleep apnea was discussed today in order to improve our ability to maintain sinus rhythm long term. She is now on CPAP therapy.  4. HTN Stable, no changes today.  5. Bradycardia Better on lower dose of BB.   Follow up in the AF clinic in 4 months.   [facility] [address] [place], [place] [address] [phone] 09/15/2018 10:42 AM

## 2018-09-23 ENCOUNTER — Other Ambulatory Visit: Payer: Self-pay

## 2018-09-23 ENCOUNTER — Ambulatory Visit: Payer: Self-pay

## 2018-09-23 ENCOUNTER — Encounter: Payer: Self-pay | Admitting: Family Medicine

## 2018-09-23 ENCOUNTER — Ambulatory Visit (INDEPENDENT_AMBULATORY_CARE_PROVIDER_SITE_OTHER): Payer: Medicare Other | Admitting: Family Medicine

## 2018-09-23 VITALS — BP 118/70 | HR 62 | Wt 226.0 lb

## 2018-09-23 DIAGNOSIS — M25551 Pain in right hip: Secondary | ICD-10-CM

## 2018-09-23 DIAGNOSIS — M7061 Trochanteric bursitis, right hip: Secondary | ICD-10-CM | POA: Diagnosis not present

## 2018-09-23 DIAGNOSIS — M5416 Radiculopathy, lumbar region: Secondary | ICD-10-CM

## 2018-09-23 NOTE — Patient Instructions (Signed)
Good to see you Call us when you need [place] [phone] and we will consider MRI

## 2018-09-23 NOTE — Assessment & Plan Note (Addendum)
Patient given injection today.  Tolerated the procedure well with some mild improvement.  Patient hopefully will make some improvement and I do think though unfortunately lumbar radiculopathy is also playing a role.  Patient does have an L4-L5 spondylolisthesis that I think contributes to some of the nerve impingement.  If no significant improvement over the next week I would encourage patient to consider the MRI order.  Otherwise if this does not work patient will follow-up with [place] again in 4 weeks.

## 2018-09-23 NOTE — Progress Notes (Signed)
[name] [place], [place] [address] Phone: [phone] Subjective:   I [name] am serving as a Education administrator for Dr. Hulan Saas.  I'm seeing this patient by the request  of:    CC: Right hip pain  [id number]  Bridget [name] is a 73 y.o. female coming in with complaint of hip pain. Patient is painful today. States the hip is not doing well.  Patient states that the pain is worsened recently.  Patient feels like she is back to where she started.  Feels that the injection that was for the gluteal tendon did help but was never 100% better.  X-rays of the back did show the patient did have spondylolisthesis.  This was at L4-L5.  This was independently visualized by me. Patient states the pain is waking her up at night and affecting daily activities.  Unable to even go up or down stairs.  No groin pain associated with it.     Past Medical History:  Diagnosis Date  . Hypertension   . Seasonal allergies    Past Surgical History:  Procedure Laterality Date  . BREAST EXCISIONAL BIOPSY Left   . CHOLECYSTECTOMY  02/2010  . KNEE SURGERY    . OOPHORECTOMY  01/2003   RSO AT [facility]  . VAGINAL HYSTERECTOMY  01/2003   [facility] A&P REPAIR   Social History   Socioeconomic History  . Marital status: Married    Spouse name: Not on file  . Number of children: Not on file  . Years of education: Not on file  . Highest education level: Not on file  Occupational History  . Not on file  Social Needs  . Financial resource strain: Not on file  . Food insecurity    Worry: Not on file    Inability: Not on file  . Transportation needs    Medical: Not on file    Non-medical: Not on file  Tobacco Use  . Smoking status: Never Smoker  . Smokeless tobacco: Never Used  Substance and Sexual Activity  . Alcohol use: Yes    Comment: SELDOM  . Drug use: No  . Sexual activity: Not on file    Comment: HYST  Lifestyle  . Physical activity    Days per  week: Not on file    Minutes per session: Not on file  . Stress: Not on file  Relationships  . Social Herbalist on phone: Not on file    Gets together: Not on file    Attends religious service: Not on file    Active member of club or organization: Not on file    Attends meetings of clubs or organizations: Not on file    Relationship status: Not on file  Other Topics Concern  . Not on file  Social History Narrative  . Not on file   Allergies  Allergen Reactions  . Codeine Nausea Only   Family History  Problem Relation Age of Onset  . Heart disease Mother   . Diabetes Father        AODM  . Hypertension Father   . Cancer Father        LUNG  . Cancer Brother 57       BLADDER  . Breast cancer Sister 79     Current Outpatient Medications (Cardiovascular):  .  benazepril-hydrochlorthiazide (LOTENSIN HCT) 20-25 MG per tablet, Take 1 tablet by mouth daily. Marland Kitchen  DILT-XR 180 MG 24  hr capsule, Take 180 mg by mouth daily. .  metoprolol tartrate (LOPRESSOR) 25 MG tablet, TAKE 1/2 TABLET BY MOUTH 2 TIMES DAILY .  pravastatin (PRAVACHOL) 20 MG tablet, Take 20 mg by mouth at bedtime.    Current Outpatient Medications (Hematological):  .  apixaban (ELIQUIS) 5 MG TABS tablet, Take 1 tablet (5 mg total) by mouth 2 (two) times daily.  Current Outpatient Medications (Other):  .  calcium carbonate (OSCAL) 1500 (600 Ca) MG TABS tablet, Take by mouth daily with breakfast.  .  gabapentin (NEURONTIN) 100 MG capsule, Take 2 capsules (200 mg total) by mouth at bedtime. (Patient taking differently: Take 100 mg by mouth at bedtime. ) .  Multiple Vitamin (MULTIVITAMIN WITH MINERALS) TABS tablet, Take 1 tablet by mouth daily. .  potassium chloride SA (K-DUR) 20 MEQ tablet, Take 1 tablet (20 mEq total) by mouth daily.    Past medical history, social, surgical and family history all reviewed in electronic medical record.  No pertanent information unless stated regarding to the chief  complaint.   Review of Systems:  No headache, visual changes, nausea, vomiting, diarrhea, constipation, dizziness, abdominal pain, skin rash, fevers, chills, night sweats, weight loss, swollen lymph nodes,, chest pain, shortness of breath, mood changes.  Positive muscle aches, body aches  Objective  Blood pressure 118/70, pulse 62, weight 226 lb (102.5 kg), last menstrual period 01/07/2003, SpO2 98 %.    General: No apparent distress alert and oriented x3 mood and affect normal, dressed appropriately.  HEENT: Pupils equal, extraocular movements intact  Respiratory: Patient's speak in full sentences and does not appear short of breath  Cardiovascular: Trace lower extremity edema, non tender, no erythema  Skin: Warm dry intact with no signs of infection or rash on extremities or on axial skeleton.  Abdomen: Soft nontender obese Neuro: Cranial nerves II through XII are intact, neurovascularly intact in all extremities with 2+ DTRs and 2+ pulses.  Lymph: No lymphadenopathy of posterior or anterior cervical chain or axillae bilaterally.  Gait severely antalgic favoring the right hip MSK:  tender with limited range of motion and stability and symmetric strength and tone of shoulders, elbows, wrist,  knee and ankles bilaterally.   Right hip exam shows some decreased range of motion in all planes but no groin pain with internal range of motion.  Severely tender to palpation over the greater trochanteric area and mild over the gluteal area.  Straight leg test reduced from 40 degrees of forward flexion.  Deep tendon reflexes are intact distally.   Procedure: Real-time Ultrasound Guided Injection of right greater trochanteric bursitis secondary to patient's body habitus Device: GE Logiq Q7 Ultrasound guided injection is preferred based studies that show increased duration, increased effect, greater accuracy, decreased procedural pain, increased response rate, and decreased cost with ultrasound guided  versus blind injection.  Verbal informed consent obtained.  Time-out conducted.  Noted no overlying erythema, induration, or other signs of local infection.  Skin prepped in a sterile fashion.  Local anesthesia: Topical Ethyl chloride.  With sterile technique and under real time ultrasound guidance:  Greater trochanteric area was visualized and patient's bursa was noted. A 22-gauge 3 inch needle was inserted and 4 cc of 0.5% Marcaine and 1 cc of Kenalog 40 mg/dL was injected. Pictures taken Completed without difficulty  Pain immediately resolved suggesting accurate placement of the medication.  Advised to call if fevers/chills, erythema, induration, drainage, or persistent bleeding.  Images permanently stored and available for review in the ultrasound unit.  Impression: Technically successful ultrasound guided injection.   Impression and Recommendations:     This case required medical decision making of moderate complexity. The above documentation has been reviewed and is accurate and complete Lyndal Pulley, DO       Note: This dictation was prepared with Dragon dictation along with smaller phrase technology. Any transcriptional errors that result from this process are unintentional.

## 2018-09-23 NOTE — Assessment & Plan Note (Signed)
Continue the gabapentin, does have somewhat of a positive straight leg test will need to monitor.  Worsening symptoms advanced imaging would be warranted

## 2018-09-27 ENCOUNTER — Telehealth: Payer: Self-pay | Admitting: *Deleted

## 2018-09-27 ENCOUNTER — Other Ambulatory Visit: Payer: Self-pay

## 2018-09-27 DIAGNOSIS — M545 Low back pain, unspecified: Secondary | ICD-10-CM

## 2018-09-27 NOTE — Telephone Encounter (Signed)
Spoke with patient. Order placed. [facility] phone number provided to patient to schedule.

## 2018-09-27 NOTE — Telephone Encounter (Signed)
I would recommend MRI in the back

## 2018-09-27 NOTE — Telephone Encounter (Signed)
Pt left msg stating that she saw you last Thursday 9/17, she received an injection and you wanted her to call to let you know how she's doing. Pt stated that she's not doing any better and does not think the injection gave her any relief. Pt would like to know what to do next.

## 2018-10-01 NOTE — Telephone Encounter (Signed)
Patient has a 10 week follow up appointment scheduled for 10/28/18 8:00. Patient understands she needs to keep this appointment for insurance compliance. Left detailed message on voicemail with date and time and informed patient to call back to confirm or reschedule. [phone].

## 2018-10-07 ENCOUNTER — Telehealth: Payer: Self-pay | Admitting: *Deleted

## 2018-10-07 ENCOUNTER — Ambulatory Visit
Admission: RE | Admit: 2018-10-07 | Discharge: 2018-10-07 | Disposition: A | Discharge status: Home / Self Care | Payer: Medicare Other | Source: Ambulatory Visit | Attending: Family Medicine | Admitting: Family Medicine

## 2018-10-07 ENCOUNTER — Other Ambulatory Visit: Payer: Self-pay

## 2018-10-07 DIAGNOSIS — M545 Low back pain, unspecified: Secondary | ICD-10-CM

## 2018-10-07 DIAGNOSIS — M48061 Spinal stenosis, lumbar region without neurogenic claudication: Secondary | ICD-10-CM | POA: Diagnosis not present

## 2018-10-07 NOTE — Telephone Encounter (Signed)
Patient gave consent.

## 2018-10-07 NOTE — Telephone Encounter (Signed)
This encounter was created in error - please disregard.

## 2018-10-07 NOTE — Addendum Note (Signed)
Addended by: Freada Bergeron on: 10/07/2018 05:03 PM   Modules accepted: Level of Service, SmartSet

## 2018-10-07 NOTE — Telephone Encounter (Signed)

## 2018-10-08 ENCOUNTER — Other Ambulatory Visit: Payer: Self-pay

## 2018-10-08 DIAGNOSIS — M545 Low back pain, unspecified: Secondary | ICD-10-CM

## 2018-10-21 ENCOUNTER — Ambulatory Visit
Admission: RE | Admit: 2018-10-21 | Discharge: 2018-10-21 | Disposition: A | Discharge status: Home / Self Care | Payer: Medicare Other | Source: Ambulatory Visit | Attending: Family Medicine | Admitting: Family Medicine

## 2018-10-21 ENCOUNTER — Other Ambulatory Visit: Payer: Self-pay | Admitting: Family Medicine

## 2018-10-21 ENCOUNTER — Other Ambulatory Visit: Payer: Self-pay

## 2018-10-21 DIAGNOSIS — M545 Low back pain, unspecified: Secondary | ICD-10-CM

## 2018-10-21 DIAGNOSIS — M5417 Radiculopathy, lumbosacral region: Secondary | ICD-10-CM | POA: Diagnosis not present

## 2018-10-21 DIAGNOSIS — M5127 Other intervertebral disc displacement, lumbosacral region: Secondary | ICD-10-CM | POA: Diagnosis not present

## 2018-10-21 MED ORDER — IOPAMIDOL (ISOVUE-M 200) INJECTION 41%
1.0000 mL | Freq: Once | INTRAMUSCULAR | Status: AC
  Administered 2018-10-21: 1 mL via EPIDURAL

## 2018-10-21 MED ORDER — METHYLPREDNISOLONE ACETATE 40 MG/ML INJ SUSP (RADIOLOG
120.0000 mg | Freq: Once | INTRAMUSCULAR | Status: AC
  Administered 2018-10-21: 120 mg via EPIDURAL

## 2018-10-21 NOTE — Discharge Instructions (Signed)

## 2018-10-22 ENCOUNTER — Ambulatory Visit: Payer: Medicare Other | Admitting: Family Medicine

## 2018-10-22 ENCOUNTER — Other Ambulatory Visit: Payer: Medicare Other

## 2018-10-28 ENCOUNTER — Telehealth: Payer: Medicare Other | Admitting: Cardiology

## 2018-10-29 DIAGNOSIS — Z23 Encounter for immunization: Secondary | ICD-10-CM | POA: Diagnosis not present

## 2018-11-03 ENCOUNTER — Encounter: Payer: Self-pay | Admitting: Family Medicine

## 2018-11-03 ENCOUNTER — Ambulatory Visit (INDEPENDENT_AMBULATORY_CARE_PROVIDER_SITE_OTHER): Payer: Medicare Other | Admitting: Family Medicine

## 2018-11-03 ENCOUNTER — Other Ambulatory Visit: Payer: Self-pay

## 2018-11-03 DIAGNOSIS — M5416 Radiculopathy, lumbar region: Secondary | ICD-10-CM

## 2018-11-03 NOTE — Progress Notes (Signed)
[name] [place], [place] [address] Phone: [phone] Subjective:   Bridget [name], am serving as a scribe for Dr. Hulan Saas.   CC: Back pain follow-up  [id number]   09/23/2018 Patient given injection today.  Tolerated the procedure well with some mild improvement.  Patient hopefully will make some improvement and I do think though unfortunately lumbar radiculopathy is also playing a role.  Patient does have an L4-L5 spondylolisthesis that I think contributes to some of the nerve impingement.  If [name] significant improvement over the next week I would encourage patient to consider the MRI order.  Otherwise if this does not work patient will follow-up with [place] again in 4 weeks.  Update 11/03/2018 Bridget [name] is a 73 y.o. female coming in with complaint of back pain. Patient had hip injection last visit and epidural injection on 10/21/2018. Patient states that the epidural injection did help to decrease her pain by 90%. Has twinges of pain intermittently.      Past Medical History:  Diagnosis Date  . Hypertension   . Seasonal allergies    Past Surgical History:  Procedure Laterality Date  . BREAST EXCISIONAL BIOPSY Left   . CHOLECYSTECTOMY  02/2010  . KNEE SURGERY    . OOPHORECTOMY  01/2003   RSO AT [facility]  . VAGINAL HYSTERECTOMY  01/2003   [facility] A&P REPAIR   Social History   Socioeconomic History  . Marital status: Married    Spouse name: Not on file  . Number of children: Not on file  . Years of education: Not on file  . Highest education level: Not on file  Occupational History  . Not on file  Social Needs  . Financial resource strain: Not on file  . Food insecurity    Worry: Not on file    Inability: Not on file  . Transportation needs    Medical: Not on file    Non-medical: Not on file  Tobacco Use  . Smoking status: Never Smoker  . Smokeless tobacco: Never Used  Substance and Sexual Activity  .  Alcohol use: Yes    Comment: SELDOM  . Drug use: [name]  . Sexual activity: Not on file    Comment: HYST  Lifestyle  . Physical activity    Days per week: Not on file    Minutes per session: Not on file  . Stress: Not on file  Relationships  . Social Herbalist on phone: Not on file    Gets together: Not on file    Attends religious service: Not on file    Active member of club or organization: Not on file    Attends meetings of clubs or organizations: Not on file    Relationship status: Not on file  Other Topics Concern  . Not on file  Social History Narrative  . Not on file   Allergies  Allergen Reactions  . Codeine Nausea Only   Family History  Problem Relation Age of Onset  . Heart disease Mother   . Diabetes Father        AODM  . Hypertension Father   . Cancer Father        LUNG  . Cancer Brother 70       BLADDER  . Breast cancer Sister 19     Current Outpatient Medications (Cardiovascular):  .  benazepril-hydrochlorthiazide (LOTENSIN HCT) 20-25 MG per tablet, Take 1 tablet by mouth daily. Marland Kitchen  DILT-XR 180 MG 24 hr capsule, Take 180 mg by mouth daily. .  metoprolol tartrate (LOPRESSOR) 25 MG tablet, TAKE 1/2 TABLET BY MOUTH 2 TIMES DAILY .  pravastatin (PRAVACHOL) 20 MG tablet, Take 20 mg by mouth at bedtime.    Current Outpatient Medications (Hematological):  .  apixaban (ELIQUIS) 5 MG TABS tablet, Take 1 tablet (5 mg total) by mouth 2 (two) times daily.  Current Outpatient Medications (Other):  .  calcium carbonate (OSCAL) 1500 (600 Ca) MG TABS tablet, Take by mouth daily with breakfast.  .  gabapentin (NEURONTIN) 100 MG capsule, Take 2 capsules (200 mg total) by mouth at bedtime. (Patient taking differently: Take 100 mg by mouth at bedtime. ) .  Multiple Vitamin (MULTIVITAMIN WITH MINERALS) TABS tablet, Take 1 tablet by mouth daily. .  potassium chloride SA (K-DUR) 20 MEQ tablet, Take 1 tablet (20 mEq total) by mouth daily.    Past medical  history, social, surgical and family history all reviewed in electronic medical record.  [name] unless stated regarding to the chief complaint.   Review of Systems:  [name] headache, visual changes, nausea, vomiting, diarrhea, constipation, dizziness, abdominal pain, skin rash, fevers, chills, night sweats, weight loss, swollen lymph nodes, body aches, joint swelling,  chest pain, shortness of breath, mood changes.  Positive muscle aches  Objective  Blood pressure 122/66, pulse 61, height 5\' 4"  (1.626 m), weight 227 lb (103 kg), last menstrual period 01/07/2003, SpO2 98 %.    General: [name] apparent distress alert and oriented x3 mood and affect normal, dressed appropriately.  HEENT: Pupils equal, extraocular movements intact  Respiratory: Patient's speak in full sentences and does not appear short of breath  Cardiovascular: [name] lower extremity edema, non tender, [name] erythema  Skin: Warm dry intact with [name] signs of infection or rash on extremities or on axial skeleton.  Abdomen: Soft nontender  Neuro: Cranial nerves II through XII are intact, neurovascularly intact in all extremities with 2+ DTRs and 2+ pulses.  Lymph: [name] lymphadenopathy of posterior or anterior cervical chain or axillae bilaterally.  Gait mild antalgic MSK:  tender with full range of motion and good stability and symmetric strength and tone of shoulders, elbows, wrist, hip, knee and ankles bilaterally.  Mild to moderate arthritic changes in multiple joints  Low back exam has some loss of lordosis, some mild degenerative scoliosis noted.  Patient does have tightness with straight leg test but [name] true radicular symptoms this time.  Neurovascularly intact distally with 5 out of 5 strength at the ankles bilaterally    Impression and Recommendations:     . The above documentation has been reviewed and is accurate and complete Lyndal Pulley, DO       Note: This dictation was prepared with Dragon dictation along  with smaller phrase technology. Any transcriptional errors that result from this process are unintentional.

## 2018-11-03 NOTE — Assessment & Plan Note (Signed)
Patient responded very well to the epidural injection.  Feels like she is doing 90% better.  We discussed that the next time, as long as patient is well can follow-up with me more on an as-needed basis.

## 2018-11-03 NOTE — Patient Instructions (Signed)
Good to see you See me again in  

## 2018-11-08 NOTE — Progress Notes (Signed)
Virtual Visit via Video Note   This visit type was conducted due to national recommendations for restrictions regarding the COVID-19 Pandemic (e.g. social distancing) in an effort to limit this patient's exposure and mitigate transmission in our community.  Due to her co-morbid illnesses, this patient is at least at moderate risk for complications without adequate follow up.  This format is felt to be most appropriate for this patient at this time.  All issues noted in this document were discussed and addressed.  A limited physical exam was performed with this format.  Please refer to the patient's chart for her consent to telehealth for [facility].   Evaluation Performed:  Follow-up visit  This visit type was conducted due to national recommendations for restrictions regarding the COVID-19 Pandemic (e.g. social distancing).  This format is felt to be most appropriate for this patient at this time.  All issues noted in this document were discussed and addressed.  No physical exam was performed (except for noted visual exam findings with Video Visits).  Please refer to the patient's chart (MyChart message for video visits and phone note for telephone visits) for the patient's consent to telehealth for [facility].  Date:  11/09/2018   ID:  Bridget [name], DOB 11-17-1945, MRN [id number]  Patient Location:  Home  Provider location:   [place]  PCP:  Jonathon Jordan, MD  Sleep Medicine:  Fransico Him, MD Electrophysiologist:  None   Chief Complaint:  OSA  History of Present Illness:    Bridget [name] is a 73 y.o. female who presents via audio/video conferencing for a telehealth visit today.    This is a 73yo female with a hx of PAF who was referred for sleep study by the afib.  She has had some excessive daytime sleepiness as well as snoring.  She underwent sleep study showing mild OSA with an AHI of 11.1/hr but severe during REM sleep with an AHi of 49/hr.  O2 sats were as  low as 89%.  She underwent CPAP titration to 6 cm H2O.    She is doing well with her CPAP device and thinks that she has gotten used to it.  She tolerates the nasal mask but would like to try a nasal pillow mask.  She feels the pressure is adequate.  Since going on CPAP she feels rested in the am and has no significant daytime sleepiness.  She denies any significant mouth or nasal dryness or nasal congestion.  She does not think that he snores.    The patient does not have symptoms concerning for COVID-19 infection (fever, chills, cough, or new shortness of breath).   Prior CV studies:   The following studies were reviewed today:  PAP compliance download  Past Medical History:  Diagnosis Date  . Hypertension   . Seasonal allergies    Past Surgical History:  Procedure Laterality Date  . BREAST EXCISIONAL BIOPSY Left   . CHOLECYSTECTOMY  02/2010  . KNEE SURGERY    . OOPHORECTOMY  01/2003   RSO AT [facility]  . VAGINAL HYSTERECTOMY  01/2003   [facility]O A&P REPAIR     Current Meds  Medication Sig  . apixaban (ELIQUIS) 5 MG TABS tablet Take 1 tablet (5 mg total) by mouth 2 (two) times daily.  . benazepril-hydrochlorthiazide (LOTENSIN HCT) 20-25 MG per tablet Take 1 tablet by mouth daily.  . calcium carbonate (OSCAL) 1500 (600 Ca) MG TABS tablet Take by mouth daily with breakfast.   . DILT-XR  180 MG 24 hr capsule Take 180 mg by mouth daily.  . metoprolol tartrate (LOPRESSOR) 25 MG tablet TAKE 1/2 TABLET BY MOUTH 2 TIMES DAILY  . Multiple Vitamin (MULTIVITAMIN WITH MINERALS) TABS tablet Take 1 tablet by mouth daily.  . potassium chloride SA (K-DUR) 20 MEQ tablet Take 1 tablet (20 mEq total) by mouth daily.  . pravastatin (PRAVACHOL) 20 MG tablet Take 20 mg by mouth at bedtime.     Allergies:   Codeine   Social History   Tobacco Use  . Smoking status: Never Smoker  . Smokeless tobacco: Never Used  Substance Use Topics  . Alcohol use: Yes    Comment: SELDOM  . Drug use: No      Family Hx: The patient's family history includes Breast cancer (age of onset: 33) in her sister; Cancer in her father; Cancer (age of onset: 33) in her brother; Diabetes in her father; Heart disease in her mother; Hypertension in her father.  ROS:   Please see the history of present illness.     All other systems reviewed and are negative.   Labs/Other Tests and Data Reviewed:    Recent Labs: 05/11/2018: B Natriuretic Peptide 101.0; Hemoglobin 13.1; Magnesium 2.0; Platelets 310; TSH 0.620 06/10/2018: BUN 15; Creatinine, Ser 0.71; Potassium 3.8; Sodium 140   Recent Lipid Panel No results found for: CHOL, TRIG, HDL, CHOLHDL, LDLCALC, LDLDIRECT  Wt Readings from Last 3 Encounters:  11/09/18 226 lb (102.5 kg)  11/03/18 227 lb (103 kg)  09/23/18 226 lb (102.5 kg)     Objective:    Vital Signs:  BP 136/71   Pulse (!) 43   Ht 5\' 4"  (1.626 m)   Wt 226 lb (102.5 kg)   LMP 01/07/2003   BMI 38.79 kg/m    CONSTITUTIONAL:  Well nourished, well developed female in no acute distress.  EYES: anicteric MOUTH: oral mucosa is pink RESPIRATORY: Normal respiratory effort, symmetric expansion CARDIOVASCULAR: No peripheral edema SKIN: No rash, lesions or ulcers MUSCULOSKELETAL: no digital cyanosis NEURO: Cranial Nerves II-XII grossly intact, moves all extremities PSYCH: Intact judgement and insight.  A&O x 3, Mood/affect appropriate   ASSESSMENT & PLAN:    1.  OSA - The pathophysiology of obstructive sleep apnea , it's cardiovascular consequences & modes of treatment including CPAP were discused with the patient in detail & they evidenced understanding.  The patient is tolerating PAP therapy well without any problems. The PAP download was reviewed today and showed an AHI of 1.3/hr on 6 cm H2O with 97% compliance in using more than 4 hours nightly.  The patient has been using and benefiting from PAP use and will continue to benefit from therapy. I will order her a nasal pillow mask to try.   2.   Obesity-I have encouraged her to get into a routine exercise program and cut back on carbs and portions.   3.  HTN -BP controlled -continue Lopressor 12.5mg  BID and Benezepril-HCT 20-25mg  daily.  4.  Bradycardia -her HR has been around 46bpm on EKGs throughout the past 6 months. -she is asymptomatic  COVID-19 Education: The signs and symptoms of COVID-19 were discussed with the patient and how to seek care for testing (follow up with PCP or arrange E-visit).  The importance of social distancing was discussed today.  Patient Risk:   After full review of this patient's clinical status, I feel that they are at least moderate risk at this time.  Time:   Today, I have spent 20  minutes directly with the patient on Telemedicine discussing medical problems including OSA, HTN and obesity.  We also reviewed the symptoms of COVID 19 and the ways to protect against contracting the virus with telehealth technology.  I spent an additional 5 minutes reviewing patient's chart including PAP compliance download and sleep study/CPAP titration.  Medication Adjustments/Labs and Tests Ordered: Current medicines are reviewed at length with the patient today.  Concerns regarding medicines are outlined above.  Tests Ordered: No orders of the defined types were placed in this encounter.  Medication Changes: No orders of the defined types were placed in this encounter.   Disposition:  Follow up in 1 year(s)  Signed, Fransico Him, MD  11/09/2018 8:55 AM    [facility]

## 2018-11-09 ENCOUNTER — Other Ambulatory Visit: Payer: Self-pay

## 2018-11-09 ENCOUNTER — Encounter: Payer: Self-pay | Admitting: Cardiology

## 2018-11-09 ENCOUNTER — Telehealth (INDEPENDENT_AMBULATORY_CARE_PROVIDER_SITE_OTHER): Payer: Medicare Other | Admitting: Cardiology

## 2018-11-09 VITALS — BP 136/71 | HR 43 | Ht 64.0 in | Wt 226.0 lb

## 2018-11-09 DIAGNOSIS — I10 Essential (primary) hypertension: Secondary | ICD-10-CM

## 2018-11-09 DIAGNOSIS — G4733 Obstructive sleep apnea (adult) (pediatric): Secondary | ICD-10-CM

## 2018-11-09 DIAGNOSIS — E669 Obesity, unspecified: Secondary | ICD-10-CM

## 2018-11-09 NOTE — Patient Instructions (Signed)
Medication Instructions:   Your physician recommends that you continue on your current medications as directed. Please refer to the Current Medication list given to you today.  *If you need a refill on your cardiac medications before your next appointment, please call your pharmacy*    Follow-Up: At [facility], you and your health needs are our priority.  As part of our continuing mission to provide you with exceptional heart care, we have created designated Provider Care Teams.  These Care Teams include your primary Cardiologist (physician) and Advanced Practice Providers (APPs -  Physician Assistants and Nurse Practitioners) who all work together to provide you with the care you need, when you need it.  Your next appointment:   12 months  The format for your next appointment:   Either In Person or Virtual  Provider:   Fransico Him, MD

## 2018-11-19 ENCOUNTER — Telehealth: Payer: Self-pay | Admitting: *Deleted

## 2018-11-19 NOTE — Telephone Encounter (Signed)
1.  OSA -  The patient has been using and benefiting from PAP use and will continue to benefit from therapy. I will order her a nasal pillow mask to try.   [facility] to [facility] for:  ResMed CPAP therapy on 6 cm H2O with a Small size [employer] Nasal Mask Eson2 mask and heated humidification.

## 2018-11-22 DIAGNOSIS — R600 Localized edema: Secondary | ICD-10-CM | POA: Diagnosis not present

## 2018-11-22 DIAGNOSIS — E7800 Pure hypercholesterolemia, unspecified: Secondary | ICD-10-CM | POA: Diagnosis not present

## 2018-11-22 DIAGNOSIS — D6869 Other thrombophilia: Secondary | ICD-10-CM | POA: Diagnosis not present

## 2018-11-22 DIAGNOSIS — R7303 Prediabetes: Secondary | ICD-10-CM | POA: Diagnosis not present

## 2018-11-22 DIAGNOSIS — Z79899 Other long term (current) drug therapy: Secondary | ICD-10-CM | POA: Diagnosis not present

## 2018-11-22 DIAGNOSIS — Z0000 Encounter for general adult medical examination without abnormal findings: Secondary | ICD-10-CM | POA: Diagnosis not present

## 2018-11-22 DIAGNOSIS — D692 Other nonthrombocytopenic purpura: Secondary | ICD-10-CM | POA: Diagnosis not present

## 2018-11-22 DIAGNOSIS — E559 Vitamin D deficiency, unspecified: Secondary | ICD-10-CM | POA: Diagnosis not present

## 2018-11-22 DIAGNOSIS — I4891 Unspecified atrial fibrillation: Secondary | ICD-10-CM | POA: Diagnosis not present

## 2018-11-22 DIAGNOSIS — R350 Frequency of micturition: Secondary | ICD-10-CM | POA: Diagnosis not present

## 2018-11-22 DIAGNOSIS — I10 Essential (primary) hypertension: Secondary | ICD-10-CM | POA: Diagnosis not present

## 2019-01-26 ENCOUNTER — Other Ambulatory Visit: Payer: Self-pay

## 2019-01-26 ENCOUNTER — Ambulatory Visit (HOSPITAL_COMMUNITY)
Admission: RE | Admit: 2019-01-26 | Discharge: 2019-01-26 | Disposition: A | Discharge status: Home / Self Care | Payer: Medicare Other | Source: Ambulatory Visit | Attending: Physician Assistant | Admitting: Physician Assistant

## 2019-01-26 VITALS — BP 126/68 | HR 61 | Ht 64.0 in | Wt 239.8 lb

## 2019-01-26 DIAGNOSIS — Z87891 Personal history of nicotine dependence: Secondary | ICD-10-CM | POA: Insufficient documentation

## 2019-01-26 DIAGNOSIS — G4733 Obstructive sleep apnea (adult) (pediatric): Secondary | ICD-10-CM | POA: Diagnosis not present

## 2019-01-26 DIAGNOSIS — Z6841 Body Mass Index (BMI) 40.0 and over, adult: Secondary | ICD-10-CM | POA: Diagnosis not present

## 2019-01-26 DIAGNOSIS — I480 Paroxysmal atrial fibrillation: Secondary | ICD-10-CM

## 2019-01-26 DIAGNOSIS — Z7901 Long term (current) use of anticoagulants: Secondary | ICD-10-CM | POA: Diagnosis not present

## 2019-01-26 DIAGNOSIS — E669 Obesity, unspecified: Secondary | ICD-10-CM | POA: Insufficient documentation

## 2019-01-26 DIAGNOSIS — Z79899 Other long term (current) drug therapy: Secondary | ICD-10-CM | POA: Insufficient documentation

## 2019-01-26 DIAGNOSIS — I10 Essential (primary) hypertension: Secondary | ICD-10-CM | POA: Insufficient documentation

## 2019-01-26 DIAGNOSIS — D6869 Other thrombophilia: Secondary | ICD-10-CM | POA: Diagnosis not present

## 2019-01-26 NOTE — Patient Instructions (Signed)
Stop metoprolol

## 2019-01-26 NOTE — Progress Notes (Signed)
Primary Care Physician: Jonathon Jordan, MD Referring Physician: [facility]  Bridget [name] is a 74 y.o. female with a history of paroxysmal atrial fibrillation who presents for follow up in the [facility]. Patient was in her usual state of health until the early morning hours of 05/11/18 when she woke with heart racing, palpitations, and diaphoresis. No associated chest discomfort or SOB. She presented to the ER and was found to be in afib with RVR. Cardioversion was attempted x4 but was unsuccessful. Of note, her K+ was 2.9 and patient admits she had diarrhea for four days prior. She denies ever having symptoms like this before. She was started on Eliquis for a CHADS2VASC score of 3 and metoprolol and discharged in rate controlled afib. She spontaneously converted to SR prior to follow up. She denies significant alcohol use.  On follow up today, patient reports that she has done very well since her last visit. She has not had any heart racing or palpitations. She is now on CPAP for OSA and is doing well. She does note some bradycardia at home although she has been asymptomatic.   Today, she denies symptoms of palpitations, chest pain, shortness of breath, orthopnea, PND, lower extremity edema, dizziness, presyncope, syncope, bleeding, or neurologic sequela. The patient is tolerating medications without difficulties and is otherwise without complaint today.    Atrial Fibrillation Risk Factors:  she does have a diagnosis of sleep apnea. She is compliant with CPAP therapy.  she does not have a history of rheumatic fever. she does not have a history of alcohol use. The patient does not have a history of early familial atrial fibrillation or other arrhythmias.  she has a BMI of Body mass index is 41.16 kg/m.Marland Kitchen Filed Weights   01/26/19 1053  Weight: 108.8 kg    Family History  Problem Relation Age of Onset  . Heart disease Mother   . Diabetes Father         AODM  . Hypertension Father   . Cancer Father        LUNG  . Cancer Brother 24       BLADDER  . Breast cancer Sister 30     Atrial Fibrillation Management history:  Previous antiarrhythmic drugs: none Previous cardioversions: none Previous ablations: none CHADS2VASC score: 3 Anticoagulation history: Eliquis   Past Medical History:  Diagnosis Date  . Hypertension   . Seasonal allergies    Past Surgical History:  Procedure Laterality Date  . BREAST EXCISIONAL BIOPSY Left   . CHOLECYSTECTOMY  02/2010  . KNEE SURGERY    . OOPHORECTOMY  01/2003   RSO AT [facility]  . VAGINAL HYSTERECTOMY  01/2003   [facility] A&P REPAIR    Current Outpatient Medications  Medication Sig Dispense Refill  . apixaban (ELIQUIS) 5 MG TABS tablet Take 1 tablet (5 mg total) by mouth 2 (two) times daily. 180 tablet 2  . benazepril-hydrochlorthiazide (LOTENSIN HCT) 20-25 MG per tablet Take 1 tablet by mouth daily.    . calcium carbonate (OSCAL) 1500 (600 Ca) MG TABS tablet Take by mouth daily with breakfast.     . cholecalciferol (VITAMIN D3) 25 MCG (1000 UNIT) tablet Take 1,000 Units by mouth daily.    Marland Kitchen DILT-XR 180 MG 24 hr capsule Take 180 mg by mouth daily.    . furosemide (LASIX) 20 MG tablet Take 20 mg by mouth as needed.     . Multiple Vitamin (MULTIVITAMIN WITH MINERALS) TABS tablet  Take 1 tablet by mouth daily.    . potassium chloride SA (K-DUR) 20 MEQ tablet Take 1 tablet (20 mEq total) by mouth daily. 90 tablet 2  . pravastatin (PRAVACHOL) 20 MG tablet Take 20 mg by mouth at bedtime.     No current facility-administered medications for this encounter.    Allergies  Allergen Reactions  . Codeine Nausea Only    Social History   Socioeconomic History  . Marital status: Married    Spouse name: Not on file  . Number of children: Not on file  . Years of education: Not on file  . Highest education level: Not on file  Occupational History  . Not on file  Tobacco Use  . Smoking status:  Never Smoker  . Smokeless tobacco: Never Used  Substance and Sexual Activity  . Alcohol use: Yes    Comment: SELDOM  . Drug use: No  . Sexual activity: Not on file    Comment: HYST  Other Topics Concern  . Not on file  Social History Narrative  . Not on file   Social Determinants of Health   Financial Resource Strain:   . Difficulty of Paying Living Expenses: Not on file  Food Insecurity:   . Worried About Charity fundraiser in the Last Year: Not on file  . Ran Out of Food in the Last Year: Not on file  Transportation Needs:   . Lack of Transportation (Medical): Not on file  . Lack of Transportation (Non-Medical): Not on file  Physical Activity:   . Days of Exercise per Week: Not on file  . Minutes of Exercise per Session: Not on file  Stress:   . Feeling of Stress : Not on file  Social Connections:   . Frequency of Communication with Friends and Family: Not on file  . Frequency of Social Gatherings with Friends and Family: Not on file  . Attends Religious Services: Not on file  . Active Member of Clubs or Organizations: Not on file  . Attends Archivist Meetings: Not on file  . Marital Status: Not on file  Intimate Partner Violence:   . Fear of Current or Ex-Partner: Not on file  . Emotionally Abused: Not on file  . Physically Abused: Not on file  . Sexually Abused: Not on file     ROS- All systems are reviewed and negative except as per the HPI above.  Physical Exam: Vitals:   01/26/19 1053  BP: 126/68  Pulse: 61  Weight: 108.8 kg  Height: 5\' 4"  (1.626 m)    GEN- The patient is well appearing obese female, alert and oriented x 3 today.   HEENT-head normocephalic, atraumatic, sclera clear, conjunctiva pink, hearing intact, trachea midline. Lungs- Clear to ausculation bilaterally, normal work of breathing Heart- Regular rate and rhythm, no murmurs, rubs or gallops  GI- soft, NT, ND, + BS Extremities- no clubbing, cyanosis. 1+ bilateral edema MS-  no significant deformity or atrophy Skin- no rash or lesion Psych- euthymic mood, full affect Neuro- strength and sensation are intact   Wt Readings from Last 3 Encounters:  01/26/19 108.8 kg  11/09/18 102.5 kg  11/03/18 103 kg    EKG today demonstrates SR HR 61, PR 174, QRS 80, QTc 432  Echo 06/10/18 1. The left ventricle has normal systolic function with an ejection fraction of 60-65%. The cavity size was normal. Left ventricular diastolic Doppler parameters are indeterminate. No evidence of left ventricular regional wall motion abnormalities.  2. The right ventricle has normal systolic function. The cavity was normal. There is no increase in right ventricular wall thickness.  3. No evidence of mitral valve stenosis.  4. The aortic valve is tricuspid. No stenosis of the aortic valve.  5. The aortic root, ascending aorta and aortic arch are normal in size and structure.   Epic records are reviewed at length today  Assessment and Plan:  1. Paroxysmal atrial fibrillation Patient appears to be maintaining SR. She has not had any further episodes of heart racing. Stop metoprolol Continue Dilt 180 mg daily Continue Eliquis 5 mg BID Lifestyle modification as below.  This patients CHA2DS2-VASc Score and unadjusted Ischemic Stroke Rate (% per year) is equal to 3.2 % stroke rate/year from a score of 3  Above score calculated as 1 point each if present [CHF, HTN, DM, Vascular=MI/PAD/Aortic Plaque, Age if 65-74, or Female] Above score calculated as 2 points each if present [Age > 75, or Stroke/TIA/TE]   2. Obesity Body mass index is 41.16 kg/m. Lifestyle modification was discussed and encouraged including regular physical activity and weight reduction.  3. OSA Lifestyle modification was discussed and encouraged including regular physical activity and weight reduction. Patient is compliant with CPAP therapy. Followed by Dr Radford Pax.  4. HTN Stable, med changes as above.   Follow  up in the AF clinic in 6 months.   [facility] [address] [place], [place] [address] [phone] 01/26/2019 11:20 AM

## 2019-03-02 ENCOUNTER — Other Ambulatory Visit (HOSPITAL_COMMUNITY): Payer: Self-pay | Admitting: Physician Assistant

## 2019-04-19 ENCOUNTER — Telehealth: Payer: Self-pay | Admitting: Cardiology

## 2019-04-19 NOTE — Telephone Encounter (Signed)
Patient understands she can switch to [facility] for her DME for her supplies once her year is up with [facility]. Pt is aware and agreeable to treatment.

## 2019-04-19 NOTE — Telephone Encounter (Signed)
Patient calling asking if [facility] is the only selection for CPAP supplies.

## 2019-04-26 ENCOUNTER — Other Ambulatory Visit (HOSPITAL_COMMUNITY): Payer: Self-pay | Admitting: Physician Assistant

## 2019-05-12 ENCOUNTER — Other Ambulatory Visit: Payer: Self-pay

## 2019-05-12 DIAGNOSIS — M5416 Radiculopathy, lumbar region: Secondary | ICD-10-CM

## 2019-05-24 ENCOUNTER — Inpatient Hospital Stay: Admission: RE | Admit: 2019-05-24 | Payer: 59 | Source: Ambulatory Visit

## 2019-06-02 ENCOUNTER — Other Ambulatory Visit: Payer: Self-pay

## 2019-06-02 ENCOUNTER — Ambulatory Visit
Admission: RE | Admit: 2019-06-02 | Discharge: 2019-06-02 | Disposition: A | Discharge status: Home / Self Care | Payer: Medicare Other | Source: Ambulatory Visit | Attending: Family Medicine | Admitting: Family Medicine

## 2019-06-02 DIAGNOSIS — M5416 Radiculopathy, lumbar region: Secondary | ICD-10-CM

## 2019-06-02 MED ORDER — METHYLPREDNISOLONE ACETATE 40 MG/ML INJ SUSP (RADIOLOG
120.0000 mg | Freq: Once | INTRAMUSCULAR | Status: AC
  Administered 2019-06-02: 120 mg via EPIDURAL

## 2019-06-02 MED ORDER — IOPAMIDOL (ISOVUE-M 200) INJECTION 41%
1.0000 mL | Freq: Once | INTRAMUSCULAR | Status: AC
  Administered 2019-06-02: 1 mL via EPIDURAL

## 2019-06-02 NOTE — Discharge Instructions (Signed)

## 2019-06-07 ENCOUNTER — Telehealth: Payer: Self-pay

## 2019-06-07 NOTE — Telephone Encounter (Signed)
Patient with diagnosis of afib on Eliquis for anticoagulation.    Procedure: colonoscopy Date of procedure: 07/08/19  CHADS2-VASc score of 4 (age x2, sex, HTN)  CrCl 38mL/min using adjusted body weight Platelet count 302K  Per office protocol, patient can hold Eliquis for 1-2 days prior to procedure.

## 2019-06-07 NOTE — Telephone Encounter (Signed)
° °  [facility] Pre-operative Risk Assessment    Request for surgical clearance:  1. What type of surgery is being performed? Colonoscopy   2. When is this surgery scheduled? 07/08/2019   3. What type of clearance is required (medical clearance vs. Pharmacy clearance to hold med vs. Both)? Both  4. Are there any medications that need to be held prior to surgery and how long? Eliquis  5. Practice name and name of physician performing surgery? [facility] (Dr. Collene Mares)  6. What is the office phone number? [phone]   7.   What is the office fax number? [phone]  8.   Anesthesia type (None, local, MAC, general) ? Colonoscopy with propofol

## 2019-06-08 NOTE — Telephone Encounter (Signed)
   Primary Cardiologist: Dr. Radford Pax / Afib clinic  Chart reviewed as part of pre-operative protocol coverage. Patient was contacted 06/08/2019 in reference to pre-operative risk assessment for pending surgery as outlined below.  Bridget [name] was last seen on 01/26/2019 by Malka So PA-C.  Since that day, Bridget [name] has done well without chest pain or shortness of breath.   Therefore, based on ACC/AHA guidelines, the patient would be at acceptable risk for the planned procedure without further cardiovascular testing.   I will route this recommendation to the requesting party via Epic fax function and remove from pre-op pool. Please call with questions.  [place], [place] 06/08/2019, 4:39 PM

## 2019-06-15 ENCOUNTER — Ambulatory Visit: Payer: 59 | Admitting: Family Medicine

## 2019-06-20 NOTE — Progress Notes (Signed)
Cardiology Office Note   Date:  06/20/2019   ID:  Bridget [name], DOB 1945-03-10, MRN [id number]  PCP:  Jonathon Jordan, MD  Cardiologist:  Dr. Radford Pax  CC: Fluid retention   History of Present Illness: Bridget [name] is a 74 y.o. female who presents for ongoing assessment and management of PAF, referred to sleep study for evaluation for OSA.  She underwent the sleep study revealing mild OSA with an AHI of 11.1/hour but severe during REM sleep with an AHI of 49/hour.  O2 sats was low as 89%.  And she was started on CPAP titration.  She is also followed in the A. fib clinic.  Was seen last on 01/26/2019 by Cala Bradford, PA.  She described feeling her heart racing with palpitations diaphoresis and presented to the ER and was found to be in A. fib with RVR on 05/11/2018.  She is noted to be hypokalemic with a potassium of 2.9.  She remains on Eliquis for CHADSVASC Score of 3, on metoprolol for rate control.  She was cleared to have colonoscopy on 06/08/2019 from cardiology standpoint and was allowed to hold Eliquis for 1 to 2 days prior to procedure.  She comes today with complaints of lower extremity edema, denies any rapid heart rhythm, dyspnea on exertion, chest discomfort, or fatigue.  She is due to have a colonoscopy and will need to hold Eliquis for 2 days prior to the procedure.  She is medically compliant and is without complaints of bleeding, abdominal cramping, or constipation.   Past Medical History:  Diagnosis Date  . Hypertension   . Seasonal allergies     Past Surgical History:  Procedure Laterality Date  . BREAST EXCISIONAL BIOPSY Left   . CHOLECYSTECTOMY  02/2010  . KNEE SURGERY    . OOPHORECTOMY  01/2003   RSO AT [facility]  . VAGINAL HYSTERECTOMY  01/2003   [facility] A&P REPAIR     Current Outpatient Medications  Medication Sig Dispense Refill  . apixaban (ELIQUIS) 5 MG TABS tablet Take 1 tablet (5 mg total) by mouth 2 (two) times daily. 180 tablet 2  .  benazepril-hydrochlorthiazide (LOTENSIN HCT) 20-25 MG per tablet Take 1 tablet by mouth daily.    . calcium carbonate (OSCAL) 1500 (600 Ca) MG TABS tablet Take by mouth daily with breakfast.     . cholecalciferol (VITAMIN D3) 25 MCG (1000 UNIT) tablet Take 1,000 Units by mouth daily.    Marland Kitchen DILT-XR 180 MG 24 hr capsule Take 180 mg by mouth daily.    . furosemide (LASIX) 20 MG tablet Take 20 mg by mouth as needed.     Marland Kitchen KLOR-CON M20 20 MEQ tablet TAKE 1 TABLET BY MOUTH EVERY DAY 90 tablet 2  . Multiple Vitamin (MULTIVITAMIN WITH MINERALS) TABS tablet Take 1 tablet by mouth daily.    . pravastatin (PRAVACHOL) 20 MG tablet Take 20 mg by mouth at bedtime.     No current facility-administered medications for this visit.    Allergies:   Codeine    Social History:  The patient  reports that she has never smoked. She has never used smokeless tobacco. She reports current alcohol use. She reports that she does not use drugs.   Family History:  The patient's family history includes Breast cancer (age of onset: 75) in her sister; Cancer in her father; Cancer (age of onset: 69) in her brother; Diabetes in her father; Heart disease in her mother; Hypertension in her father.    ROS:  All other systems are reviewed and negative. Unless otherwise mentioned in H&P    PHYSICAL EXAM: VS:  LMP 01/07/2003  , BMI There is no height or weight on file to calculate BMI. GEN: Well nourished, well developed, in no acute distress HEENT: normal Neck: no JVD, carotid bruits, or masses Cardiac: RRR; no murmurs, rubs, or gallops, 1+ pitting bilateral dependent edema  Respiratory:  Clear to auscultation bilaterally, normal work of breathing GI: soft, nontender, nondistended, + BS MS: no deformity or atrophy Skin: warm and dry, no rash Neuro:  Strength and sensation are intact Psych: euthymic mood, full affect   EKG: Normal sinus rhythm heart rate of 60 bpm  Recent Labs: No results found for requested labs within  last 8760 hours.    Lipid Panel No results found for: CHOL, TRIG, HDL, CHOLHDL, VLDL, LDLCALC, LDLDIRECT    Wt Readings from Last 3 Encounters:  01/26/19 239 lb 12.8 oz (108.8 kg)  11/09/18 226 lb (102.5 kg)  11/03/18 227 lb (103 kg)      Other studies Reviewed:  ASSESSMENT AND PLAN:  1.  Preoperative cardiac clearance: She is doing well and is cleared to proceed with colonoscopy without any further cardiac testing and is found to be a low risk.  She will hold Eliquis 1 to 2 days prior to colonoscopy and start again as soon as possible thereafter.  2.  Atrial fibrillation: Today she is in normal sinus rhythm, she continues on apixaban 5 mg tablets twice daily without any excessive bleeding or bruising or melena.  Heart rate is well controlled on diltiazem.  She is complaining of some mild dependent edema associated with this.  She can take an additional half a tablet of Lasix as needed if she does not want to take a full Lasix for fluid retention.  However if she has significant fluid retention she should take the entire tablet.  She will also have supplement with Klor-Con potassium tablet 20 mEq which she is to take with diuretic.  She has recently had labs completed by Dr. Cheron Schaumann in May I will request those results.  3. Hypertension: She remains on benazepril HCTZ.  Blood pressure is low normal.  She is not complaining of any dizziness, positional changes or orthostatic changes at this time.  With her age we need to be very careful concerning her blood pressure control to avoid hypotension which may lead to falls.  This should be monitored at home.  Blood pressure is less than 824 systolic we will need to adjust her medications.   Current medicines are reviewed at length with the patient today.  I have spent 25 minutes  dedicated to the care of this patient on the date of this encounter to include pre-visit review of records, assessment, management and diagnostic testing,with shared  decision making.  Labs/ tests ordered today include: None.  Requesting labs from PCP  Phill Myron. West Pugh, ANP, [facility]   06/20/2019 7:53 AM    [address] [facility] [place] [address] Office [phone] Fax [phone]  Notice: This dictation was prepared with Dragon dictation along with smaller phrase technology. Any transcriptional errors that result from this process are unintentional and may not be corrected upon review.

## 2019-06-21 ENCOUNTER — Encounter: Payer: Self-pay | Admitting: Adult Health

## 2019-06-21 ENCOUNTER — Ambulatory Visit (INDEPENDENT_AMBULATORY_CARE_PROVIDER_SITE_OTHER): Payer: Medicare Other | Admitting: Adult Health

## 2019-06-21 ENCOUNTER — Other Ambulatory Visit: Payer: Self-pay

## 2019-06-21 VITALS — BP 110/80 | HR 60 | Ht 65.0 in | Wt 228.0 lb

## 2019-06-21 DIAGNOSIS — I10 Essential (primary) hypertension: Secondary | ICD-10-CM | POA: Diagnosis not present

## 2019-06-21 DIAGNOSIS — I480 Paroxysmal atrial fibrillation: Secondary | ICD-10-CM | POA: Diagnosis not present

## 2019-06-21 DIAGNOSIS — Z01818 Encounter for other preprocedural examination: Secondary | ICD-10-CM | POA: Diagnosis not present

## 2019-06-21 MED ORDER — FUROSEMIDE 20 MG PO TABS: 20.0000 mg | ORAL_TABLET | ORAL | 2 refills | Status: DC | PRN

## 2019-06-21 MED ORDER — POTASSIUM CHLORIDE CRYS ER 20 MEQ PO TBCR: 20.0000 meq | EXTENDED_RELEASE_TABLET | Freq: Every day | ORAL | 2 refills | Status: DC

## 2019-06-21 NOTE — Patient Instructions (Signed)
Medication Instructions:  OK to take an extra half(1/2) tablet of Furosemide(Lasix) and potassium if there is puffiness in legs and feet  *If you need a refill on your cardiac medications before your next appointment, please call your pharmacy*   Lab Work: None Ordered   Testing/Procedures: None Ordered   Follow-Up: At [facility], you and your health needs are our priority.  As part of our continuing mission to provide you with exceptional heart care, we have created designated Provider Care Teams.  These Care Teams include your primary Cardiologist (physician) and Advanced Practice Providers (APPs -  Physician Assistants and Nurse Practitioners) who all work together to provide you with the care you need, when you need it.  We recommend signing up for the patient portal called "MyChart".  Sign up information is provided on this After Visit Summary.  MyChart is used to connect with patients for Virtual Visits (Telemedicine).  Patients are able to view lab/test results, encounter notes, upcoming appointments, etc.  Non-urgent messages can be sent to your provider as well.   To learn more about what you can do with MyChart, go to [email or web address].    Your next appointment:   6 month(s)  The format for your next appointment:   In Person  Provider:   You may see Dr Radford Pax or one of the following Advanced Practice Providers on your designated Care Team:    Melina Copa, PA-C  Ermalinda Barrios, PA-C

## 2019-06-22 ENCOUNTER — Other Ambulatory Visit (HOSPITAL_COMMUNITY): Payer: Self-pay

## 2019-06-22 MED ORDER — APIXABAN 5 MG PO TABS: 5.0000 mg | ORAL_TABLET | Freq: Two times a day (BID) | ORAL | 2 refills | Status: DC

## 2019-06-28 ENCOUNTER — Ambulatory Visit (INDEPENDENT_AMBULATORY_CARE_PROVIDER_SITE_OTHER): Payer: Medicare Other | Admitting: Family Medicine

## 2019-06-28 ENCOUNTER — Other Ambulatory Visit: Payer: Self-pay

## 2019-06-28 ENCOUNTER — Encounter: Payer: Self-pay | Admitting: Family Medicine

## 2019-06-28 DIAGNOSIS — M5416 Radiculopathy, lumbar region: Secondary | ICD-10-CM

## 2019-06-28 NOTE — Assessment & Plan Note (Signed)
Patient with known arthritic changes and lumbar radiculopathy. Patient was having an exacerbation but is doing much better at this time. Still wants to avoid any type of prescription medications if possible. Responding well to the epidurals. Patient is to increase activity slowly. Discussed icing regimen. Follow-up as needed if patient does well

## 2019-06-28 NOTE — Progress Notes (Signed)
[facility] [place] Phone: [phone] Subjective:   [name], am serving as a scribe for Dr. Hulan Saas. This visit occurred during the SARS-CoV-2 public health emergency.  Safety protocols were in place, including screening questions prior to the visit, additional usage of staff PPE, and extensive cleaning of exam room while observing appropriate contact time as indicated for disinfecting solutions.   I'm seeing this patient by the request  of:  Jonathon Jordan, MD  CC: Low back pain  HCW:[id number]  Bridget [name] is a 74 y.o. female coming in with complaint of back pain. Epidural on 06/02/2019. Last seen 11/03/2018. Patient is no longer having pain going down her right leg. Does feel the weather change in her lower back today. Patient has had lumbar radiculopathy before and did respond well to the epidural. Patient states since this last 1 is feeling much better again. States that she is feeling approximately 85 to 95% better. No radicular symptoms, no weakness. Been able to be doing things in her yard again. Playing with grandchildren as well much more regularly.     Past Medical History:  Diagnosis Date  . Hypertension   . Seasonal allergies    Past Surgical History:  Procedure Laterality Date  . BREAST EXCISIONAL BIOPSY Left   . CHOLECYSTECTOMY  02/2010  . KNEE SURGERY    . OOPHORECTOMY  01/2003   RSO AT [facility]  . VAGINAL HYSTERECTOMY  01/2003   [facility] A&P REPAIR   Social History   Socioeconomic History  . Marital status: Married    Spouse name: Not on file  . Number of children: Not on file  . Years of education: Not on file  . Highest education level: Not on file  Occupational History  . Not on file  Tobacco Use  . Smoking status: Never Smoker  . Smokeless tobacco: Never Used  Substance and Sexual Activity  . Alcohol use: Yes    Comment: SELDOM  . Drug use: No  . Sexual activity: Not on  file    Comment: HYST  Other Topics Concern  . Not on file  Social History Narrative  . Not on file   Social Determinants of Health   Financial Resource Strain:   . Difficulty of Paying Living Expenses:   Food Insecurity:   . Worried About Charity fundraiser in the Last Year:   . Arboriculturist in the Last Year:   Transportation Needs:   . Film/video editor (Medical):   Marland Kitchen Lack of Transportation (Non-Medical):   Physical Activity:   . Days of Exercise per Week:   . Minutes of Exercise per Session:   Stress:   . Feeling of Stress :   Social Connections:   . Frequency of Communication with Friends and Family:   . Frequency of Social Gatherings with Friends and Family:   . Attends Religious Services:   . Active Member of Clubs or Organizations:   . Attends Archivist Meetings:   Marland Kitchen Marital Status:    Allergies  Allergen Reactions  . Codeine Nausea Only   Family History  Problem Relation Age of Onset  . Heart disease Mother   . Diabetes Father        AODM  . Hypertension Father   . Cancer Father        LUNG  . Cancer Brother 3       BLADDER  .  Breast cancer Sister 95     Current Outpatient Medications (Cardiovascular):  .  benazepril-hydrochlorthiazide (LOTENSIN HCT) 20-25 MG per tablet, Take 1 tablet by mouth daily. Marland Kitchen  DILT-XR 180 MG 24 hr capsule, Take 180 mg by mouth daily. .  furosemide (LASIX) 20 MG tablet, Take 1 tablet (20 mg total) by mouth as needed. .  pravastatin (PRAVACHOL) 20 MG tablet, Take 20 mg by mouth at bedtime.    Current Outpatient Medications (Hematological):  .  apixaban (ELIQUIS) 5 MG TABS tablet, Take 1 tablet (5 mg total) by mouth 2 (two) times daily.  Current Outpatient Medications (Other):  .  calcium carbonate (OSCAL) 1500 (600 Ca) MG TABS tablet, Take by mouth daily with breakfast.  .  cholecalciferol (VITAMIN D3) 25 MCG (1000 UNIT) tablet, Take 1,000 Units by mouth daily. Marland Kitchen  FAMOTIDINE PO, Take 2 tablets by  mouth. 1 tab in the morning 1 tab in the afternoon .  Multiple Vitamin (MULTIVITAMIN WITH MINERALS) TABS tablet, Take 1 tablet by mouth daily. .  potassium chloride SA (KLOR-CON M20) 20 MEQ tablet, Take 1 tablet (20 mEq total) by mouth daily.   Reviewed prior external information including notes and imaging from  primary care provider As well as notes that were available from care everywhere and other healthcare systems.  Past medical history, social, surgical and family history all reviewed in electronic medical record.  No pertanent information unless stated regarding to the chief complaint.   Review of Systems:  No headache, visual changes, nausea, vomiting, diarrhea, constipation, dizziness, abdominal pain, skin rash, fevers, chills, night sweats, weight loss, swollen lymph nodes, body aches, joint swelling, chest pain, shortness of breath, mood changes. POSITIVE muscle aches  Objective  Blood pressure 124/76, pulse 77, height 5\' 5"  (1.651 m), weight 229 lb (103.9 kg), last menstrual period 01/07/2003, SpO2 97 %.   General: No apparent distress alert and oriented x3 mood and affect normal, dressed appropriately.  HEENT: Pupils equal, extraocular movements intact  Respiratory: Patient's speak in full sentences and does not appear short of breath  Cardiovascular: No lower extremity edema, non tender, no erythema  Neuro: Cranial nerves II through XII are intact, neurovascularly intact in all extremities with 2+ DTRs and 2+ pulses.  Gait normal with good balance and coordination.  MSK: Arthritic changes of multiple joints Patient back exam mild tightness with straight leg but no radicular symptoms. Minimal tenderness to palpation in the paraspinal musculature. Patient still has some mild limitation in range of motion secondary to mild body habitus as well as some poor core strength. Tightness with FABER test. Also again nothing severe.    Impression and Recommendations:     The above  documentation has been reviewed and is accurate and complete Lyndal Pulley, DO       Note: This dictation was prepared with Dragon dictation along with smaller phrase technology. Any transcriptional errors that result from this process are unintentional.

## 2019-06-28 NOTE — Patient Instructions (Signed)
Turmeric 500mg  1200mg  of Tart Cherry Extract at night See me when you need me

## 2019-07-26 ENCOUNTER — Other Ambulatory Visit: Payer: Self-pay

## 2019-07-26 ENCOUNTER — Ambulatory Visit (HOSPITAL_COMMUNITY)
Admission: RE | Admit: 2019-07-26 | Discharge: 2019-07-26 | Disposition: A | Discharge status: Home / Self Care | Payer: Medicare Other | Source: Ambulatory Visit | Attending: Physician Assistant | Admitting: Physician Assistant

## 2019-07-26 ENCOUNTER — Encounter (HOSPITAL_COMMUNITY): Payer: Self-pay | Admitting: Physician Assistant

## 2019-07-26 VITALS — BP 124/70 | HR 63 | Ht 65.0 in | Wt 231.2 lb

## 2019-07-26 DIAGNOSIS — Z7901 Long term (current) use of anticoagulants: Secondary | ICD-10-CM | POA: Insufficient documentation

## 2019-07-26 DIAGNOSIS — G4733 Obstructive sleep apnea (adult) (pediatric): Secondary | ICD-10-CM | POA: Diagnosis not present

## 2019-07-26 DIAGNOSIS — D6869 Other thrombophilia: Secondary | ICD-10-CM | POA: Diagnosis not present

## 2019-07-26 DIAGNOSIS — I10 Essential (primary) hypertension: Secondary | ICD-10-CM | POA: Insufficient documentation

## 2019-07-26 DIAGNOSIS — Z8249 Family history of ischemic heart disease and other diseases of the circulatory system: Secondary | ICD-10-CM | POA: Insufficient documentation

## 2019-07-26 DIAGNOSIS — E669 Obesity, unspecified: Secondary | ICD-10-CM | POA: Insufficient documentation

## 2019-07-26 DIAGNOSIS — Z6838 Body mass index (BMI) 38.0-38.9, adult: Secondary | ICD-10-CM | POA: Insufficient documentation

## 2019-07-26 DIAGNOSIS — I480 Paroxysmal atrial fibrillation: Secondary | ICD-10-CM | POA: Insufficient documentation

## 2019-07-26 NOTE — Progress Notes (Signed)
Primary Care Physician: Jonathon Jordan, MD Referring Physician: [facility]  Bridget [name] is a 74 y.o. female with a history of paroxysmal atrial fibrillation who presents for follow up in the [facility]. Patient was in her usual state of health until the early morning hours of 05/11/18 when she woke with heart racing, palpitations, and diaphoresis. No associated chest discomfort or SOB. She presented to the ER and was found to be in afib with RVR. Cardioversion was attempted x4 but was unsuccessful. Of note, her K+ was 2.9 and patient admits she had diarrhea for four days prior. She denies ever having symptoms like this before. She was started on Eliquis for a CHADS2VASC score of 3 and metoprolol and discharged in rate controlled afib. She spontaneously converted to SR prior to follow up. She denies significant alcohol use.  On follow up today, patient reports she has done well since her last visit. She denies any heart racing or palpitations. She denies any bleeding issues on anticoagulation.   Today, she denies symptoms of palpitations, chest pain, shortness of breath, orthopnea, PND, dizziness, presyncope, syncope, bleeding, or neurologic sequela. The patient is tolerating medications without difficulties and is otherwise without complaint today.    Atrial Fibrillation Risk Factors:  she does have a diagnosis of sleep apnea. She is compliant with CPAP therapy.  she does not have a history of rheumatic fever. she does not have a history of alcohol use. The patient does not have a history of early familial atrial fibrillation or other arrhythmias.  she has a BMI of Body mass index is 38.47 kg/m.Marland Kitchen Filed Weights   07/26/19 1041  Weight: 104.9 kg    Family History  Problem Relation Age of Onset  . Heart disease Mother   . Diabetes Father        AODM  . Hypertension Father   . Cancer Father        LUNG  . Cancer Brother 85       BLADDER  .  Breast cancer Sister 36     Atrial Fibrillation Management history:  Previous antiarrhythmic drugs: none Previous cardioversions: none Previous ablations: none CHADS2VASC score: 3 Anticoagulation history: Eliquis   Past Medical History:  Diagnosis Date  . Hypertension   . Seasonal allergies    Past Surgical History:  Procedure Laterality Date  . BREAST EXCISIONAL BIOPSY Left   . CHOLECYSTECTOMY  02/2010  . KNEE SURGERY    . OOPHORECTOMY  01/2003   RSO AT [facility]  . VAGINAL HYSTERECTOMY  01/2003   [facility] A&P REPAIR    Current Outpatient Medications  Medication Sig Dispense Refill  . apixaban (ELIQUIS) 5 MG TABS tablet Take 1 tablet (5 mg total) by mouth 2 (two) times daily. 180 tablet 2  . benazepril-hydrochlorthiazide (LOTENSIN HCT) 20-25 MG per tablet Take 1 tablet by mouth daily.    . calcium carbonate (OSCAL) 1500 (600 Ca) MG TABS tablet Take by mouth daily with breakfast.     . cholecalciferol (VITAMIN D3) 25 MCG (1000 UNIT) tablet Take 1,000 Units by mouth daily.    Marland Kitchen DILT-XR 180 MG 24 hr capsule Take 180 mg by mouth daily.    Marland Kitchen FAMOTIDINE PO Take 2 tablets by mouth. 1 tab in the morning 1 tab in the afternoon    . furosemide (LASIX) 20 MG tablet Take 1 tablet (20 mg total) by mouth as needed. 90 tablet 2  . Multiple Vitamin (MULTIVITAMIN WITH MINERALS) TABS  tablet Take 1 tablet by mouth daily.    . potassium chloride SA (KLOR-CON M20) 20 MEQ tablet Take 1 tablet (20 mEq total) by mouth daily. 90 tablet 2  . pravastatin (PRAVACHOL) 20 MG tablet Take 20 mg by mouth at bedtime.     No current facility-administered medications for this encounter.    Allergies  Allergen Reactions  . Codeine Nausea Only    Social History   Socioeconomic History  . Marital status: Married    Spouse name: Not on file  . Number of children: Not on file  . Years of education: Not on file  . Highest education level: Not on file  Occupational History  . Not on file  Tobacco Use  .  Smoking status: Never Smoker  . Smokeless tobacco: Never Used  Substance and Sexual Activity  . Alcohol use: Yes    Comment: SELDOM  . Drug use: No  . Sexual activity: Not on file    Comment: HYST  Other Topics Concern  . Not on file  Social History Narrative  . Not on file   Social Determinants of Health   Financial Resource Strain:   . Difficulty of Paying Living Expenses:   Food Insecurity:   . Worried About Charity fundraiser in the Last Year:   . Arboriculturist in the Last Year:   Transportation Needs:   . Film/video editor (Medical):   Marland Kitchen Lack of Transportation (Non-Medical):   Physical Activity:   . Days of Exercise per Week:   . Minutes of Exercise per Session:   Stress:   . Feeling of Stress :   Social Connections:   . Frequency of Communication with Friends and Family:   . Frequency of Social Gatherings with Friends and Family:   . Attends Religious Services:   . Active Member of Clubs or Organizations:   . Attends Archivist Meetings:   Marland Kitchen Marital Status:   Intimate Partner Violence:   . Fear of Current or Ex-Partner:   . Emotionally Abused:   Marland Kitchen Physically Abused:   . Sexually Abused:      ROS- All systems are reviewed and negative except as per the HPI above.  Physical Exam: Vitals:   07/26/19 1041  BP: 124/70  Pulse: 63  Weight: 104.9 kg  Height: 5\' 5"  (1.651 m)    GEN- The patient is well appearing obese elderly female, alert and oriented x 3 today.   HEENT-head normocephalic, atraumatic, sclera clear, conjunctiva pink, hearing intact, trachea midline. Lungs- Clear to ausculation bilaterally, normal work of breathing Heart- Regular rate and rhythm, no murmurs, rubs or gallops  GI- soft, NT, ND, + BS Extremities- no clubbing, cyanosis. Non pitting edema MS- no significant deformity or atrophy Skin- no rash or lesion Psych- euthymic mood, full affect Neuro- strength and sensation are intact   Wt Readings from Last 3  Encounters:  07/26/19 104.9 kg  06/28/19 103.9 kg  06/21/19 103.4 kg    EKG today demonstrates SR HR 63, PR 162, QRS 82, QTc 440  Echo 06/10/18 1. The left ventricle has normal systolic function with an ejection fraction of 60-65%. The cavity size was normal. Left ventricular diastolic Doppler parameters are indeterminate. No evidence of left ventricular regional wall motion abnormalities.  2. The right ventricle has normal systolic function. The cavity was normal. There is no increase in right ventricular wall thickness.  3. No evidence of mitral valve stenosis.  4. The aortic  valve is tricuspid. No stenosis of the aortic valve.  5. The aortic root, ascending aorta and aortic arch are normal in size and structure.   Epic records are reviewed at length today  Assessment and Plan:  1. Paroxysmal atrial fibrillation Patient appears to be maintaining SR. Continue Dilt 180 mg daily Continue Eliquis 5 mg BID  This patients CHA2DS2-VASc Score and unadjusted Ischemic Stroke Rate (% per year) is equal to 3.2 % stroke rate/year from a score of 3  Above score calculated as 1 point each if present [CHF, HTN, DM, Vascular=MI/PAD/Aortic Plaque, Age if 65-74, or Female] Above score calculated as 2 points each if present [Age > 75, or Stroke/TIA/TE]  2. Obesity Body mass index is 38.47 kg/m. Lifestyle modification was discussed and encouraged including regular physical activity and weight reduction.  3. OSA Patient is compliant with CPAP therapy. Followed by Dr Radford Pax.  4. HTN Stable, no changes today.   Follow up with Dr Radford Pax per recall. AF clinic as needed.    [facility] [address] [place], [place] [address] [phone] 07/26/2019 10:56 AM

## 2019-08-09 ENCOUNTER — Other Ambulatory Visit: Payer: Self-pay | Admitting: Family Medicine

## 2019-08-09 DIAGNOSIS — Z1231 Encounter for screening mammogram for malignant neoplasm of breast: Secondary | ICD-10-CM

## 2019-08-15 ENCOUNTER — Other Ambulatory Visit: Payer: Self-pay

## 2019-08-15 ENCOUNTER — Ambulatory Visit
Admission: RE | Admit: 2019-08-15 | Discharge: 2019-08-15 | Disposition: A | Discharge status: Home / Self Care | Payer: Medicare Other | Source: Ambulatory Visit | Attending: Family Medicine | Admitting: Family Medicine

## 2019-08-15 DIAGNOSIS — Z1231 Encounter for screening mammogram for malignant neoplasm of breast: Secondary | ICD-10-CM

## 2019-11-24 ENCOUNTER — Encounter: Payer: Self-pay | Admitting: Family Medicine

## 2019-11-24 ENCOUNTER — Ambulatory Visit (INDEPENDENT_AMBULATORY_CARE_PROVIDER_SITE_OTHER): Payer: Medicare Other | Admitting: Family Medicine

## 2019-11-24 ENCOUNTER — Ambulatory Visit (INDEPENDENT_AMBULATORY_CARE_PROVIDER_SITE_OTHER): Payer: Medicare Other

## 2019-11-24 ENCOUNTER — Other Ambulatory Visit: Payer: Self-pay

## 2019-11-24 VITALS — BP 122/80 | HR 68 | Ht 65.0 in | Wt 234.0 lb

## 2019-11-24 DIAGNOSIS — M1712 Unilateral primary osteoarthritis, left knee: Secondary | ICD-10-CM

## 2019-11-24 DIAGNOSIS — M25562 Pain in left knee: Secondary | ICD-10-CM

## 2019-11-24 DIAGNOSIS — G8929 Other chronic pain: Secondary | ICD-10-CM

## 2019-11-24 NOTE — Assessment & Plan Note (Signed)
Patient does have more of a degenerative left knee.  Seems to be severe.  Likely nearly bone-on-bone on the left side.  Bilateral standing x-rays pending.  Injection given today.  Tolerated the procedure well.  We will consider the possibility of viscosupplementation.  Patient does have some instability will consider custom brace but patient wants to hold at this time.  Follow-up with me again in 4 to 8 weeks

## 2019-11-24 NOTE — Progress Notes (Signed)
[facility] [address] [place] Phone: [phone] Subjective:   I Bridget [name] am serving as a Education administrator for Dr. Hulan Saas.  This visit occurred during the SARS-CoV-2 public health emergency.  Safety protocols were in place, including screening questions prior to the visit, additional usage of staff PPE, and extensive cleaning of exam room while observing appropriate contact time as indicated for disinfecting solutions.   I'm seeing this patient by the request  of:  Jonathon Jordan, MD  CC: Left knee pain  MBW:[id number]   06/28/2019 Patient with known arthritic changes and lumbar radiculopathy. Patient was having an exacerbation but is doing much better at this time. Still wants to avoid any type of prescription medications if possible. Responding well to the epidurals. Patient is to increase activity slowly. Discussed icing regimen. Follow-up as needed if patient does well  Update 11/24/2019 Bridget [name] is a 74 y.o. female coming in with complaint of left knee pain. Would like an injection in the knee. States she has arthritis and knows she needs knee surgery. Was outside doing yard work when she felt pain.  Since then has been having to ambulate more with the aid of a cane.  Patient states that it feels a little bit unstable.  Patient states that he has some difficulty flexing with it all the way.  Patient now rates the severity of pain is 5 out of 10.     Past Medical History:  Diagnosis Date  . Hypertension   . Seasonal allergies    Past Surgical History:  Procedure Laterality Date  . BREAST EXCISIONAL BIOPSY Left   . CHOLECYSTECTOMY  02/2010  . KNEE SURGERY    . OOPHORECTOMY  01/2003   RSO AT [facility]  . VAGINAL HYSTERECTOMY  01/2003   [facility] A&P REPAIR   Social History   Socioeconomic History  . Marital status: Married    Spouse name: Not on file  . Number of children: Not on file  . Years of education: Not on  file  . Highest education level: Not on file  Occupational History  . Not on file  Tobacco Use  . Smoking status: Never Smoker  . Smokeless tobacco: Never Used  Substance and Sexual Activity  . Alcohol use: Yes    Comment: SELDOM  . Drug use: No  . Sexual activity: Not on file    Comment: HYST  Other Topics Concern  . Not on file  Social History Narrative  . Not on file   Social Determinants of Health   Financial Resource Strain:   . Difficulty of Paying Living Expenses: Not on file  Food Insecurity:   . Worried About Charity fundraiser in the Last Year: Not on file  . Ran Out of Food in the Last Year: Not on file  Transportation Needs:   . Lack of Transportation (Medical): Not on file  . Lack of Transportation (Non-Medical): Not on file  Physical Activity:   . Days of Exercise per Week: Not on file  . Minutes of Exercise per Session: Not on file  Stress:   . Feeling of Stress : Not on file  Social Connections:   . Frequency of Communication with Friends and Family: Not on file  . Frequency of Social Gatherings with Friends and Family: Not on file  . Attends Religious Services: Not on file  . Active Member of Clubs or Organizations: Not on file  . Attends Club or  Organization Meetings: Not on file  . Marital Status: Not on file   Allergies  Allergen Reactions  . Codeine Nausea Only   Family History  Problem Relation Age of Onset  . Heart disease Mother   . Diabetes Father        AODM  . Hypertension Father   . Cancer Father        LUNG  . Cancer Brother 28       BLADDER  . Breast cancer Sister 41     Current Outpatient Medications (Cardiovascular):  .  benazepril-hydrochlorthiazide (LOTENSIN HCT) 20-25 MG per tablet, Take 1 tablet by mouth daily. Marland Kitchen  DILT-XR 180 MG 24 hr capsule, Take 180 mg by mouth daily. .  furosemide (LASIX) 20 MG tablet, Take 1 tablet (20 mg total) by mouth as needed. .  pravastatin (PRAVACHOL) 20 MG tablet, Take 20 mg by mouth at  bedtime.    Current Outpatient Medications (Hematological):  .  apixaban (ELIQUIS) 5 MG TABS tablet, Take 1 tablet (5 mg total) by mouth 2 (two) times daily.  Current Outpatient Medications (Other):  .  calcium carbonate (OSCAL) 1500 (600 Ca) MG TABS tablet, Take by mouth daily with breakfast.  .  cholecalciferol (VITAMIN D3) 25 MCG (1000 UNIT) tablet, Take 1,000 Units by mouth daily. Marland Kitchen  FAMOTIDINE PO, Take 2 tablets by mouth. 1 tab in the morning 1 tab in the afternoon .  Multiple Vitamin (MULTIVITAMIN WITH MINERALS) TABS tablet, Take 1 tablet by mouth daily. .  potassium chloride SA (KLOR-CON M20) 20 MEQ tablet, Take 1 tablet (20 mEq total) by mouth daily.   Reviewed prior external information including notes and imaging from  primary care provider As well as notes that were available from care everywhere and other healthcare systems.  Past medical history, social, surgical and family history all reviewed in electronic medical record.  No pertanent information unless stated regarding to the chief complaint.   Review of Systems:  No headache, visual changes, nausea, vomiting, diarrhea, constipation, dizziness, abdominal pain, skin rash, fevers, chills, night sweats, weight loss, swollen lymph nodes, body aches, , chest pain, shortness of breath, mood changes. POSITIVE muscle aches joint swelling  Objective  Blood pressure 122/80, pulse 68, height 5\' 5"  (1.651 m), weight 234 lb (106.1 kg), last menstrual period 01/07/2003, SpO2 98 %.   General: No apparent distress alert and oriented x3 mood and affect normal, dressed appropriately.  HEENT: Pupils equal, extraocular movements intact  Respiratory: Patient's speak in full sentences and does not appear short of breath  Cardiovascular: No lower extremity edema, non tender, no erythema  Severely antalgic walking with the aid of a cane. MSK:  .  Knee: Left valgus deformity noted. Large thigh to calf ratio.  Tender to palpation over medial  and PF joint line.  ROM full in flexion and extension and lower leg rotation. instability with valgus force.  painful patellar compression. Patellar glide with moderate crepitus. Patellar and quadriceps tendons unremarkable. Hamstring and quadriceps strength is normal. Contralateral knee shows arthritic changes of the knee as well but no instability.  After informed written and verbal consent, patient was seated on exam table. Left knee was prepped with alcohol swab and utilizing anterolateral approach, patient's left knee space was injected with 4:1  marcaine 0.5%: Kenalog 40mg /dL. Patient tolerated the procedure well without immediate complications.    Impression and Recommendations:     The above documentation has been reviewed and is accurate and complete Lyndal Pulley, DO

## 2019-11-24 NOTE — Patient Instructions (Addendum)
Good to see you Xray today Ice 20 mins 2 times a day Pennsaid for the knees Injection today See me again in 6 weeks

## 2019-11-30 ENCOUNTER — Other Ambulatory Visit: Payer: Self-pay

## 2019-11-30 ENCOUNTER — Ambulatory Visit: Payer: Medicare Other | Admitting: Cardiology

## 2019-11-30 ENCOUNTER — Ambulatory Visit (INDEPENDENT_AMBULATORY_CARE_PROVIDER_SITE_OTHER): Payer: Medicare Other | Admitting: Cardiology

## 2019-11-30 ENCOUNTER — Encounter: Payer: Self-pay | Admitting: Cardiology

## 2019-11-30 VITALS — BP 138/82 | HR 79 | Ht 65.0 in | Wt 232.4 lb

## 2019-11-30 DIAGNOSIS — G4733 Obstructive sleep apnea (adult) (pediatric): Secondary | ICD-10-CM

## 2019-11-30 DIAGNOSIS — I480 Paroxysmal atrial fibrillation: Secondary | ICD-10-CM | POA: Diagnosis not present

## 2019-11-30 DIAGNOSIS — I10 Essential (primary) hypertension: Secondary | ICD-10-CM | POA: Diagnosis not present

## 2019-11-30 DIAGNOSIS — E669 Obesity, unspecified: Secondary | ICD-10-CM

## 2019-11-30 DIAGNOSIS — R600 Localized edema: Secondary | ICD-10-CM

## 2019-11-30 NOTE — Progress Notes (Signed)
Date:  11/30/2019   ID:  Bridget [name], DOB 1945-06-25, MRN [id number]   PCP:  Jonathon Jordan, MD  Sleep Medicine:  Fransico Him, MD Electrophysiologist:  None   Chief Complaint:  OSA  History of Present Illness:    Bridget [name] is a 74 y.o. female with a hx of PAF.   She has had some excessive daytime sleepiness as well as snoring.  She underwent sleep study showing mild OSA with an AHI of 11.1/hr but severe during REM sleep with an AHi of 49/hr.  O2 sats were as low as 89%.  She underwent CPAP titration to 6 cm H2O.   She is here today for followup and is doing well.  She denies any chest pain or pressure, SOB, DOE, PND, orthopnea, LE edema, dizziness, palpitations or syncope. She is compliant with her meds and is tolerating meds with no SE.    She is doing well with her CPAP device and thinks that she has gotten used to it.  She tolerates the mask and feels the pressure is adequate.  Since going on CPAP she feels rested in the am and has no significant daytime sleepiness.  She denies any significant mouth or nasal dryness or nasal congestion.  She does not think that he snores.     Prior CV studies:   The following studies were reviewed today:  PAP compliance download  Past Medical History:  Diagnosis Date  . Hypertension   . Seasonal allergies    Past Surgical History:  Procedure Laterality Date  . BREAST EXCISIONAL BIOPSY Left   . CHOLECYSTECTOMY  02/2010  . KNEE SURGERY    . OOPHORECTOMY  01/2003   RSO AT [facility]  . VAGINAL HYSTERECTOMY  01/2003   [facility] A&P REPAIR     Current Meds  Medication Sig  . apixaban (ELIQUIS) 5 MG TABS tablet Take 1 tablet (5 mg total) by mouth 2 (two) times daily.  . benazepril-hydrochlorthiazide (LOTENSIN HCT) 20-25 MG per tablet Take 1 tablet by mouth daily.  . calcium carbonate (OSCAL) 1500 (600 Ca) MG TABS tablet Take by mouth daily with breakfast.   . cholecalciferol (VITAMIN D3) 25 MCG (1000 UNIT) tablet Take 1,000 Units  by mouth daily.  Marland Kitchen DILT-XR 180 MG 24 hr capsule Take 180 mg by mouth daily.  Marland Kitchen FAMOTIDINE PO Take 2 tablets by mouth. 1 tab in the morning 1 tab in the afternoon  . furosemide (LASIX) 20 MG tablet Take 1 tablet (20 mg total) by mouth as needed.  . Multiple Vitamin (MULTIVITAMIN WITH MINERALS) TABS tablet Take 1 tablet by mouth daily.  . potassium chloride SA (KLOR-CON M20) 20 MEQ tablet Take 1 tablet (20 mEq total) by mouth daily.  . pravastatin (PRAVACHOL) 20 MG tablet Take 20 mg by mouth at bedtime.     Allergies:   Codeine   Social History   Tobacco Use  . Smoking status: Never Smoker  . Smokeless tobacco: Never Used  Substance Use Topics  . Alcohol use: Yes    Comment: SELDOM  . Drug use: No     Family Hx: The patient's family history includes Breast cancer (age of onset: 41) in her sister; Cancer in her father; Cancer (age of onset: 46) in her brother; Diabetes in her father; Heart disease in her mother; Hypertension in her father.  ROS:   Please see the history of present illness.     All other systems reviewed and are negative.   Labs/Other  Tests and Data Reviewed:    Recent Labs: No results found for requested labs within last 8760 hours.   Recent Lipid Panel No results found for: CHOL, TRIG, HDL, CHOLHDL, LDLCALC, LDLDIRECT  Wt Readings from Last 3 Encounters:  11/30/19 232 lb 6.4 oz (105.4 kg)  11/24/19 234 lb (106.1 kg)  07/26/19 231 lb 3.2 oz (104.9 kg)     Objective:    Vital Signs:  BP 138/82   Pulse 79   Ht 5\' 5"  (1.651 m)   Wt 232 lb 6.4 oz (105.4 kg)   LMP 01/07/2003   SpO2 98%   BMI 38.67 kg/m    GEN: Well nourished, well developed in no acute distress HEENT: Normal NECK: No JVD; No carotid bruits LYMPHATICS: No lymphadenopathy CARDIAC:RRR, no murmurs, rubs, gallops RESPIRATORY:  Clear to auscultation without rales, wheezing or rhonchi  ABDOMEN: Soft, non-tender, non-distended MUSCULOSKELETAL:  Trace edema; No deformity  SKIN: Warm and  dry NEUROLOGIC:  Alert and oriented x 3 PSYCHIATRIC:  Normal affect    ASSESSMENT & PLAN:    1.  OSA -  The patient is tolerating PAP therapy well without any problems. The PAP download was reviewed today and showed an AHI of 2/hr on 6 cm H2O with 100% compliance in using more than 4 hours nightly.  The patient has been using and benefiting from PAP use and will continue to benefit from therapy.   2.  Obesity -I have encouraged her to get into a routine exercise program and cut back on carbs and portions.  -she has just gotten a knee injection and has started back to walking  3.  HTN -BP controlled -continue Diltiazem XR 180mg  daily and Benezepril-HCT 20-25mg  daily.  4.  Chronic LE edema -this is controlled on PRN Lasix  5.  PAF -she is maintaining NSR on exam today -she occasionally has a skipped heart beat but nothing sustained -she denies any bleeding on the DOAC -SCr was stable at 0.7 and Hbg 13 in May 2021 -continue on Diltiazem XR 180mg  daily and Eliquis 5mg  BID  Medication Adjustments/Labs and Tests Ordered: Current medicines are reviewed at length with the patient today.  Concerns regarding medicines are outlined above.  Tests Ordered: No orders of the defined types were placed in this encounter.  Medication Changes: No orders of the defined types were placed in this encounter.   Disposition:  Follow up in 1 year(s)  Signed, Fransico Him, MD  11/30/2019 11:54 AM    [facility]

## 2019-11-30 NOTE — Patient Instructions (Signed)

## 2020-01-10 ENCOUNTER — Ambulatory Visit: Payer: Medicare Other | Admitting: Family Medicine

## 2020-01-12 ENCOUNTER — Ambulatory Visit (INDEPENDENT_AMBULATORY_CARE_PROVIDER_SITE_OTHER): Payer: Medicare Other | Admitting: Family Medicine

## 2020-01-12 ENCOUNTER — Encounter: Payer: Self-pay | Admitting: Family Medicine

## 2020-01-12 ENCOUNTER — Other Ambulatory Visit: Payer: Self-pay

## 2020-01-12 DIAGNOSIS — D6869 Other thrombophilia: Secondary | ICD-10-CM | POA: Diagnosis not present

## 2020-01-12 DIAGNOSIS — M1712 Unilateral primary osteoarthritis, left knee: Secondary | ICD-10-CM | POA: Diagnosis not present

## 2020-01-12 NOTE — Assessment & Plan Note (Signed)
Patient has made significant improvement at this time.  Discussed which activities to doing which wants to avoid.  Increase activity slowly.  Discussed icing regimen and home exercises.  Patient is doing well we will hold on any other type of injection or bracing at this point.  Patient will follow up with me again in 2 months

## 2020-01-12 NOTE — Patient Instructions (Addendum)
Can take Tylenol 2x a day Ice after activity Let's check back in 2 months

## 2020-01-12 NOTE — Progress Notes (Signed)
[name] [address] [place] [address] Phone: [phone] Subjective:   I Ronelle Nigh am serving as a Neurosurgeon for Dr. Antoine Primas.  This visit occurred during the SARS-CoV-2 public health emergency.  Safety protocols were in place, including screening questions prior to the visit, additional usage of staff PPE, and extensive cleaning of exam room while observing appropriate contact time as indicated for disinfecting solutions.   I'm seeing this patient by the request  of:  Mila Palmer, MD  CC: Left knee pain follow-up  YHC:[id number]   11/24/2019 Patient does have more of a degenerative left knee.  Seems to be severe.  Likely nearly bone-on-bone on the left side.  Bilateral standing x-rays pending.  Injection given today.  Tolerated the procedure well.  We will consider the possibility of viscosupplementation.  Patient does have some instability will consider custom brace but patient wants to hold at this time.  Follow-up with me again in 4 to 8 weeks  Update 01/12/2020 Bridget [name] is a 75 y.o. female coming in with complaint of left knee pain. Patient states she is doing much better. Still experiencing some pain but not as bad as before.  Patient does feel like she is feeling approximately 80% better.  Patient states if she stands a long amount of time does have pain on the lateral aspect of the knee.  Patient denies any significant increase in any instability.  Feels like she has been able to do more activity.  Trying the home exercises fairly regularly at this time.      Past Medical History:  Diagnosis Date  . Hypertension   . Seasonal allergies    Past Surgical History:  Procedure Laterality Date  . BREAST EXCISIONAL BIOPSY Left   . CHOLECYSTECTOMY  02/2010  . KNEE SURGERY    . OOPHORECTOMY  01/2003   RSO AT [facility]  . VAGINAL HYSTERECTOMY  01/2003   [facility] A&P REPAIR   Social History   Socioeconomic History  . Marital  status: Married    Spouse name: Not on file  . Number of children: Not on file  . Years of education: Not on file  . Highest education level: Not on file  Occupational History  . Not on file  Tobacco Use  . Smoking status: Never Smoker  . Smokeless tobacco: Never Used  Substance and Sexual Activity  . Alcohol use: Yes    Comment: SELDOM  . Drug use: No  . Sexual activity: Not on file    Comment: HYST  Other Topics Concern  . Not on file  Social History Narrative  . Not on file   Social Determinants of Health   Financial Resource Strain: Not on file  Food Insecurity: Not on file  Transportation Needs: Not on file  Physical Activity: Not on file  Stress: Not on file  Social Connections: Not on file   Allergies  Allergen Reactions  . Codeine Nausea Only   Family History  Problem Relation Age of Onset  . Heart disease Mother   . Diabetes Father        AODM  . Hypertension Father   . Cancer Father        LUNG  . Cancer Brother 55       BLADDER  . Breast cancer Sister 44     Current Outpatient Medications (Cardiovascular):  .  benazepril-hydrochlorthiazide (LOTENSIN HCT) 20-25 MG per tablet, Take 1 tablet by mouth daily. Marland Kitchen  DILT-XR 180 MG 24 hr capsule, Take 180 mg by mouth daily. .  furosemide (LASIX) 20 MG tablet, Take 1 tablet (20 mg total) by mouth as needed. .  pravastatin (PRAVACHOL) 20 MG tablet, Take 20 mg by mouth at bedtime.    Current Outpatient Medications (Hematological):  .  apixaban (ELIQUIS) 5 MG TABS tablet, Take 1 tablet (5 mg total) by mouth 2 (two) times daily.  Current Outpatient Medications (Other):  .  calcium carbonate (OSCAL) 1500 (600 Ca) MG TABS tablet, Take by mouth daily with breakfast.  .  cholecalciferol (VITAMIN D3) 25 MCG (1000 UNIT) tablet, Take 1,000 Units by mouth daily. Marland Kitchen  FAMOTIDINE PO, Take 2 tablets by mouth. 1 tab in the morning 1 tab in the afternoon .  Multiple Vitamin (MULTIVITAMIN WITH MINERALS) TABS tablet, Take 1  tablet by mouth daily. .  potassium chloride SA (KLOR-CON M20) 20 MEQ tablet, Take 1 tablet (20 mEq total) by mouth daily.   Reviewed prior external information including notes and imaging from  primary care provider As well as notes that were available from care everywhere and other healthcare systems.  Past medical history, social, surgical and family history all reviewed in electronic medical record.  No pertanent information unless stated regarding to the chief complaint.   Review of Systems:  No headache, visual changes, nausea, vomiting, diarrhea, constipation, dizziness, abdominal pain, skin rash, fevers, chills, night sweats, weight loss, swollen lymph nodes,joint swelling, chest pain, shortness of breath, mood changes. POSITIVE muscle aches body aches  Objective  Blood pressure (!) 142/80, pulse 74, height 5\' 5"  (1.651 m), weight 232 lb (105.2 kg), last menstrual period 01/07/2003, SpO2 98 %.   General: No apparent distress alert and oriented x3 mood and affect normal, dressed appropriately.  HEENT: Pupils equal, extraocular movements intact  Respiratory: Patient's speak in full sentences and does not appear short of breath  Cardiovascular: Trace lower extremity edema, mild tender left calf, no erythema  Knee: Left valgus deformity noted. Large thigh to calf ratio.  Tender to palpation over medial and PF joint line.  But improvement. ROM full in flexion and extension and lower leg rotation. instability with valgus force.  painful patellar compression. Patellar glide with moderate crepitus. Patellar and quadriceps tendons unremarkable. Hamstring and quadriceps strength is normal.     Impression and Recommendations:     The above documentation has been reviewed and is accurate and complete Lyndal Pulley, DO

## 2020-01-12 NOTE — Assessment & Plan Note (Signed)
On Eliquis mild calf pain but seems to be muscular.

## 2020-03-13 NOTE — Progress Notes (Signed)
[place] [address] [place] Phone: [phone] Subjective:   I Bridget [name] am serving as a Education administrator for Dr. Hulan Saas.  This visit occurred during the SARS-CoV-2 public health emergency.  Safety protocols were in place, including screening questions prior to the visit, additional usage of staff PPE, and extensive cleaning of exam room while observing appropriate contact time as indicated for disinfecting solutions.   I'm seeing this patient by the request  of:  Jonathon Jordan, MD  CC: Knee pain and back pain follow-up  WLS:[id number]   01/12/2020 Patient has made significant improvement at this time.  Discussed which activities to doing which wants to avoid.  Increase activity slowly.  Discussed icing regimen and home exercises.  Patient is doing well we will hold on any other type of injection or bracing at this point.  Patient will follow up with me again in 2 months  On Eliquis mild calf pain but seems to be muscular.  Patient states that it comes and goes.  Nothing that stops her from activity.  Seems to be worse after sitting for some time but then when she gets up and moving does seem to get a little better.  Update 03/14/2020 Bridget [name] is a 75 y.o. female coming in with complaint of left knee pain. Patient states the knee is doing pretty good. States her back may be worse. ADLs causing pain. States she would like to know if PT helps. Mostly pain on the right but will radiate across the back.  Patient has responded well to 2 epidurals over the course of the years and the last one was in May 2021.  Patient is wondering though if there is any other strengthening.  Was able to play with her grandchildren for a long amount of time but then had soreness the next day.  Very minimal radicular pain down the left leg.  Knee still has some mild instability.  He does think she may want a replacement at some point but is not ready for it.   Does not think another injection would be helpful at this time either.      Past Medical History:  Diagnosis Date  . Hypertension   . Seasonal allergies    Past Surgical History:  Procedure Laterality Date  . BREAST EXCISIONAL BIOPSY Left   . CHOLECYSTECTOMY  02/2010  . KNEE SURGERY    . OOPHORECTOMY  01/2003   [facility]  . VAGINAL HYSTERECTOMY  01/2003   [facility]&P REPAIR   Social History   Socioeconomic History  . Marital status: Married    Spouse name: Not on file  . Number of children: Not on file  . Years of education: Not on file  . Highest education level: Not on file  Occupational History  . Not on file  Tobacco Use  . Smoking status: Never Smoker  . Smokeless tobacco: Never Used  Substance and Sexual Activity  . Alcohol use: Yes    Comment: SELDOM  . Drug use: No  . Sexual activity: Not on file    Comment: HYST  Other Topics Concern  . Not on file  Social History Narrative  . Not on file   Social Determinants of Health   Financial Resource Strain: Not on file  Food Insecurity: Not on file  Transportation Needs: Not on file  Physical Activity: Not on file  Stress: Not on file  Social Connections: Not on file  Allergies  Allergen Reactions  . Codeine Nausea Only   Family History  Problem Relation Age of Onset  . Heart disease Mother   . Diabetes Father        AODM  . Hypertension Father   . Cancer Father        LUNG  . Cancer Brother 43       BLADDER  . Breast cancer Sister 33     Current Outpatient Medications (Cardiovascular):  .  benazepril-hydrochlorthiazide (LOTENSIN HCT) 20-25 MG per tablet, Take 1 tablet by mouth daily. Marland Kitchen  DILT-XR 180 MG 24 hr capsule, Take 180 mg by mouth daily. .  furosemide (LASIX) 20 MG tablet, Take 1 tablet (20 mg total) by mouth as needed. .  pravastatin (PRAVACHOL) 20 MG tablet, Take 20 mg by mouth at bedtime.    Current Outpatient Medications (Hematological):  .  apixaban (ELIQUIS) 5 MG TABS  tablet, Take 1 tablet (5 mg total) by mouth 2 (two) times daily.  Current Outpatient Medications (Other):  .  calcium carbonate (OSCAL) 1500 (600 Ca) MG TABS tablet, Take by mouth daily with breakfast.  .  cholecalciferol (VITAMIN D3) 25 MCG (1000 UNIT) tablet, Take 1,000 Units by mouth daily. Marland Kitchen  FAMOTIDINE PO, Take 2 tablets by mouth. 1 tab in the morning 1 tab in the afternoon .  Multiple Vitamin (MULTIVITAMIN WITH MINERALS) TABS tablet, Take 1 tablet by mouth daily. .  potassium chloride SA (KLOR-CON M20) 20 MEQ tablet, Take 1 tablet (20 mEq total) by mouth daily.   Reviewed prior external information including notes and imaging from  primary care provider As well as notes that were available from care everywhere and other healthcare systems.  Past medical history, social, surgical and family history all reviewed in electronic medical record.  No pertanent information unless stated regarding to the chief complaint.   Review of Systems:  No headache, visual changes, nausea, vomiting, diarrhea, constipation, dizziness, abdominal pain, skin rash, fevers, chills, night sweats, weight loss, swollen lymph nodes,  chest pain, shortness of breath, mood changes. POSITIVE muscle aches, body aches, joint swelling  Objective  Blood pressure 112/82, pulse 66, height 5\' 5"  (1.651 m), weight 237 lb (107.5 kg), last menstrual period 01/07/2003, SpO2 98 %.   General: No apparent distress alert and oriented x3 mood and affect normal, dressed appropriately.  HEENT: Pupils equal, extraocular movements intact  Respiratory: Patient's speak in full sentences and does not appear short of breath  Cardiovascular: Trace lower extremity edema, non tender, no erythema  Gait antalgic MSK: Knee exam does have significant arthritic changes with instability noted with valgus and varus force.  Patient does lack the last 5 degrees of extension in the last 10 degrees of flexion.  Minimal soreness noted over the medial and  lateral joint space.  Back exam mild loss of lordosis.  Tightness with straight leg test on the left side.  No true radicular symptoms.  Does have strength of the lower extremities bilaterally better 4-5 but symmetric.  Low back is tender to palpation in the L4-L5 paraspinal musculature left greater than right.  No midline tenderness.    Impression and Recommendations:    The above documentation has been reviewed and is accurate and complete Lyndal Pulley, DO

## 2020-03-14 ENCOUNTER — Other Ambulatory Visit: Payer: Self-pay

## 2020-03-14 ENCOUNTER — Encounter: Payer: Self-pay | Admitting: Family Medicine

## 2020-03-14 ENCOUNTER — Ambulatory Visit (INDEPENDENT_AMBULATORY_CARE_PROVIDER_SITE_OTHER): Payer: Medicare Other | Admitting: Family Medicine

## 2020-03-14 VITALS — BP 112/82 | HR 66 | Ht 65.0 in | Wt 237.0 lb

## 2020-03-14 DIAGNOSIS — M5416 Radiculopathy, lumbar region: Secondary | ICD-10-CM

## 2020-03-14 DIAGNOSIS — M1712 Unilateral primary osteoarthritis, left knee: Secondary | ICD-10-CM | POA: Diagnosis not present

## 2020-03-14 DIAGNOSIS — G8929 Other chronic pain: Secondary | ICD-10-CM | POA: Diagnosis not present

## 2020-03-14 DIAGNOSIS — M545 Low back pain, unspecified: Secondary | ICD-10-CM

## 2020-03-14 NOTE — Assessment & Plan Note (Signed)
Known significant arthritic changes with likely nerve root impingement.  Has responded to 2 epidurals previously and the last one was in May 2021.  Patient wants to hold at this time but would like to start with formal physical therapy which I think will be beneficial.  Patient will start in the near future hopefully.  Patient will follow up with me again in 2 to 3 months

## 2020-03-14 NOTE — Assessment & Plan Note (Signed)
Patient still feels like she is doing relatively well overall.  Discussed with patient about icing regimen and home exercises.  Patient declined any type of injection today.  Patient is considering the possibility of replacement.  Encouraged her to keep the weight down with her BMI at 39.  Worsening pain patient can come back for another injection if necessary.  Follow-up with me again in 2 to 3 months

## 2020-03-14 NOTE — Patient Instructions (Signed)
Good to see you PT [facility]  Think the knee is doing decent we can always do injection Another back injection write me when you are ready See me again in 3 months we can talk about referral to orthopedic surgery

## 2020-03-15 ENCOUNTER — Other Ambulatory Visit (HOSPITAL_COMMUNITY): Payer: Self-pay | Admitting: Physician Assistant

## 2020-03-15 NOTE — Telephone Encounter (Signed)
Pt last saw Dr Radford Pax 11/30/19, last labs 05/23/19 Creat 0.7, age 75, weight 107.5kg, based on specified criteria pt is on appropriate dosage of Eliquis 5mg  BID for afib.  Will refill rx.

## 2020-03-20 ENCOUNTER — Ambulatory Visit: Payer: Medicare Other | Attending: Family Medicine

## 2020-03-20 ENCOUNTER — Other Ambulatory Visit: Payer: Self-pay

## 2020-03-20 DIAGNOSIS — R252 Cramp and spasm: Secondary | ICD-10-CM | POA: Diagnosis not present

## 2020-03-20 DIAGNOSIS — M545 Low back pain, unspecified: Secondary | ICD-10-CM | POA: Insufficient documentation

## 2020-03-20 DIAGNOSIS — G8929 Other chronic pain: Secondary | ICD-10-CM | POA: Insufficient documentation

## 2020-03-20 NOTE — Therapy (Signed)
[facility] [address], [place], [place], [address] Phone: [phone]   Fax:  [phone]  Physical Therapy Evaluation  Patient Details  Name: Bridget [name] MRN: [id number] Date of Birth: 04-17-1945 Referring Provider (PT): Hulan Saas, MD   Encounter Date: 03/20/2020   PT End of Session - 03/20/20 0928    Visit Number 1    Date for PT Re-Evaluation 05/15/20    Authorization Type [employer]    Progress Note Due on Visit 10    PT Start Time 0847    PT Stop Time 0927    PT Time Calculation (min) 40 min    Activity Tolerance Patient tolerated treatment well    Behavior During Therapy [facility] for tasks assessed/performed           Past Medical History:  Diagnosis Date  . Hypertension   . Seasonal allergies     Past Surgical History:  Procedure Laterality Date  . BREAST EXCISIONAL BIOPSY Left   . CHOLECYSTECTOMY  02/2010  . KNEE SURGERY    . OOPHORECTOMY  01/2003   RSO AT [facility]  . VAGINAL HYSTERECTOMY  01/2003   [facility]P REPAIR    There were no vitals filed for this visit.    Subjective Assessment - 03/20/20 0850    Subjective Pt presents to PT with 2 year history of LBP sustained after stepping off a retaining wall.  I have a pinched nerve on the Rt side.  Daily activites and housework have become increasingly harder.  2 injections in the past that have helped some.    Pertinent History a-fit, Lt RTC tear, DJD Lt knee-plans to have knee replacement soon    Limitations Standing;Walking    How long can you stand comfortably? 1-2 hours with after    How long can you walk comfortably? 1-2 hours with pain after    Diagnostic tests known lumbar DDD    Patient Stated Goals increased ease with housework, reduce LBP    Currently in Pain? Yes    Pain Score 3    up to 6/10   Pain Location Back    Pain Orientation Right    Pain Descriptors / Indicators Aching    Pain Type Chronic pain    Pain Onset  More than a month ago    Pain Frequency Intermittent    Aggravating Factors  standing/walking long periods, housework, yardwork    Pain Relieving Factors rest, Aspercream gel              [facility] PT Assessment - 03/20/20 0001      Assessment   Medical Diagnosis chronic low back pain    Referring Provider (PT) Hulan Saas, MD    Onset Date/Surgical Date 03/09/19    Next MD Visit 06/2020      Precautions   Precautions None      Restrictions   Weight Bearing Restrictions No      Balance Screen   Has the patient fallen in the past 6 months No    Has the patient had a decrease in activity level because of a fear of falling?  No    Is the patient reluctant to leave their home because of a fear of falling?  No      Home Environment   Living Environment Private residence    Living Arrangements Spouse/significant other    Type of [place] to enter  Home Layout One level      Prior Function   Level of Independence Independent    Vocation Retired    Leisure Pension scheme manager   Overall Cognitive Status Within Functional Limits for tasks assessed      Observation/Other Assessments   Focus on Therapeutic Outcomes (FOTO)  52 (63 is goal)      Posture/Postural Control   Posture/Postural Control Postural limitations    Postural Limitations Weight shift right;Flexed trunk;Forward head      ROM / Strength   AROM / PROM / Strength PROM;Strength;AROM      AROM   Overall AROM  Within functional limits for tasks performed    Overall AROM Comments Lumbar A/ROM is limited by 25% with Rt>Lt lumbar pain at end range of each      PROM   Overall PROM  Deficits    Overall PROM Comments hip flexibility is limited by 25% without pain      Strength   Overall Strength Deficits    Overall Strength Comments 4/5 bil hip strength, 4-/5 bil knee strength, ankle DF 4+/5      Palpation   Palpation comment palpable tenderness over Lt quadratus, bil gluteals  and bil lumbar paraspinalsRt>Lt      Special Tests    Special Tests Lumbar    Lumbar Tests Slump Test;Straight Leg Raise      Slump test   Findings Negative    Side Right      Straight Leg Raise   Findings Negative    Side  Right      Transfers   Transfers Stand to Sit;Sit to Stand    Sit to Stand With upper extremity assist    Stand to Sit With upper extremity assist      Ambulation/Gait   Ambulation/Gait Yes    Ambulation/Gait Assistance 6: Modified independent (Device/Increase time)    Gait Pattern Step-through pattern;Decreased stance time - left;Antalgic;Lateral hip instability                      Objective measurements completed on examination: See above findings.               PT Education - 03/20/20 0921    Education Details Access Code: [email or web address]    Person(s) Educated Patient    Methods Explanation;Demonstration;Handout    Comprehension Verbalized understanding;Returned demonstration            PT Short Term Goals - 03/20/20 0841      PT SHORT TERM GOAL #1   Title be independent in initial HEP    Time 4    Period Weeks    Status New    Target Date 04/17/20      PT SHORT TERM GOAL #2   Title report < or = to 4/10 LBP with housework and yardwork    Time 4    Period Weeks    Status New    Target Date 04/17/20      PT SHORT TERM GOAL #3   Title report a 30% reduction in the frequency and intensity of LBP with daily tasks.    Time 4    Period Weeks    Status New    Target Date 04/17/20             PT Long Term Goals - 03/20/20 0843      PT LONG TERM GOAL #1   Title be independent in advanced HEP  Time 8    Period Weeks    Status New    Target Date 05/15/20      PT LONG TERM GOAL #2   Title report < or = to 2-3/10 LBP with yardwork and housework    Time 8    Period Weeks    Status New    Target Date 05/15/20      PT LONG TERM GOAL #3   Title improve FOTO to > or = to 63    Time 8    Period Weeks     Status New    Target Date 05/15/20      PT LONG TERM GOAL #4   Title verbalize and demonstrate body mechanics modification for lumbar protection with housework and yard work    Time 8    Period Weeks    Status New    Target Date 05/15/20      PT LONG TERM GOAL #5   Title report a 70% reduction in the frequency and intensity of LBP with daily tasks.    Time 8    Period Weeks    Status New    Target Date 05/15/20                  Plan - 03/20/20 1108    Clinical Impression Statement Pt presents to PT with LBP that began ~2 years ago when she stepped of a rail. Pt has known arthritic changes with probable nerve root compression per pt report.  Pt had a nerve root injection 06/02/19 and declined a recent injection when at the MD.  Pt reports 3-6/10 LBP Rt>Lt that is aching and worsened with housework and yardwork.  Pain is reduced with rest and topical rub.  Pt with 25% reduction in lumbar and hip flexibility and pt reports Rt >Lt lumbar pain at end range motion.  Pt with weightshift to the Rt in standing and forward trunk flexion and forward head posture.  Pt with palpable tenderness over Lt quadratus, bil gluteals and Rt>Lt lumbar paraspinals.  Pt will continue to benefit from skilled PT to address lumbar pain, body mechanics, flexibility and core strength to improve function.    Personal Factors and Comorbidities Age;Comorbidity 2    Comorbidities Lt knee pain, HTN, a-fib    Examination-Activity Limitations Locomotion Level;Squat;Stand    Examination-Participation Restrictions Community Activity;Shop    Stability/Clinical Decision Making Stable/Uncomplicated    Clinical Decision Making Low    Rehab Potential Excellent    PT Frequency 2x / week    PT Duration 8 weeks    PT Treatment/Interventions ADLs/Self Care Home Management;Cryotherapy;Electrical Stimulation;Moist Heat;Functional mobility training;Gait training;Therapeutic activities;Therapeutic exercise;Neuromuscular  re-education;Patient/family education;Manual techniques;Taping;Passive range of motion;Dry needling    PT Next Visit Plan body mechanics training, review HEP, consider DN and manual.  Begin core strength    PT Home Exercise Plan Access Code: [email or web address]    Consulted and Agree with Plan of Care Patient           Patient will benefit from skilled therapeutic intervention in order to improve the following deficits and impairments:  Abnormal gait,Decreased activity tolerance,Decreased strength,Pain,Postural dysfunction,Improper body mechanics,Decreased range of motion,Increased muscle spasms,Decreased endurance  Visit Diagnosis: Chronic bilateral low back pain, unspecified whether sciatica present - Plan: PT plan of care cert/re-cert  Cramp and spasm - Plan: PT plan of care cert/re-cert     Problem List Patient Active Problem List   Diagnosis Date Noted  . Degenerative arthritis of left knee 11/24/2019  .  Secondary hypercoagulable state ([place]) 01/26/2019  . Greater trochanteric bursitis of right hip 09/23/2018  . Lumbar radiculopathy 08/12/2018  . Gluteal tendonitis of right buttock 07/29/2018  . Atrial fibrillation ([place]) 07/21/2018  . Arthritis of left acromioclavicular joint 04/26/2018  . Partial nontraumatic tear of left rotator cuff 04/26/2018  . Hypertension   . Seasonal allergies     Sigurd Sos, PT 03/20/20 11:16 AM  [facility] [address] [place], [place], [address] Phone: [phone]   Fax:  [phone]  Name: Bridget [name] MRN: [id number] Date of Birth: 1945-12-12

## 2020-03-20 NOTE — Patient Instructions (Signed)
Access Code: [email or web address] URL: [email or web address] Date: 03/20/2020 Prepared by: Claiborne Billings  Exercises Supine Lower Trunk Rotation - 1 x daily - 7 x weekly - 1 sets - 3 reps - 20 hold Hooklying Single Knee to Chest - 3 x daily - 7 x weekly - 1 sets - 3 reps - 20 hold Seated Hamstring Stretch - 3 x daily - 7 x weekly - 1 sets - 3 reps - 20 hold

## 2020-03-27 ENCOUNTER — Ambulatory Visit: Payer: Medicare Other | Admitting: Physical Therapy

## 2020-04-02 ENCOUNTER — Telehealth: Payer: Self-pay | Admitting: Family Medicine

## 2020-04-02 NOTE — Telephone Encounter (Signed)
Patient called stating that she is still having continued pain on her left side that seems to be getting worse rather than better. She said that Dr Tamala Julian told her he would order and epidural if it continued. She asked if orders could go ahead and be sent over.  Please advise.

## 2020-04-03 ENCOUNTER — Telehealth: Payer: Self-pay | Admitting: *Deleted

## 2020-04-03 ENCOUNTER — Other Ambulatory Visit: Payer: Self-pay

## 2020-04-03 ENCOUNTER — Other Ambulatory Visit: Payer: Self-pay | Admitting: Cardiology

## 2020-04-03 DIAGNOSIS — M5416 Radiculopathy, lumbar region: Secondary | ICD-10-CM

## 2020-04-03 NOTE — Telephone Encounter (Signed)
   [facility] Pre-operative Risk Assessment    HEARTCARE STAFF: - Please ensure there is not already an duplicate clearance open for this procedure. - Under Visit Info/Reason for Call, type in Other and utilize the format Clearance MM/DD/YY or Clearance TBD. Do not use dashes or single digits. - If request is for dental extraction, please clarify the # of teeth to be extracted.  Request for surgical clearance:  1. What type of surgery is being performed?  LUMBAR INJECTIN   2. When is this surgery scheduled?  TBD   3. What type of clearance is required (medical clearance vs. Pharmacy clearance to hold med vs. Both)?  BOTH  4. Are there any medications that need to be held prior to surgery and how long? ELIQUIS X'S 2 DAYS   5. Practice name and name of physician performing surgery?  [facility]    6. What is the office phone number?  [phone]   7.   What is the office fax number?  [phone]  8.   Anesthesia type (None, local, MAC, general) ?    Jeanann Lewandowsky 04/03/2020, 2:35 PM  _________________________________________________________________   (provider comments below)

## 2020-04-03 NOTE — Telephone Encounter (Signed)
Left VM

## 2020-04-03 NOTE — Telephone Encounter (Signed)
Order placed and patient notified 

## 2020-04-03 NOTE — Telephone Encounter (Signed)
Patient with diagnosis of afib on Eliquis for anticoagulation.    Procedure:  LUMBAR INJECTIN  Date of procedure: TBD  CHA2DS2-VASc Score = 4  This indicates a 4.8% annual risk of stroke. The patient's score is based upon: CHF History: No HTN History: Yes Diabetes History: No Stroke History: No Vascular Disease History: No Age Score: 2 Gender Score: 1     CrCl 84 ml/min Platelet count 302  Per office protocol, patient can hold Eliquis for 3 days prior to procedure.

## 2020-04-05 NOTE — Telephone Encounter (Signed)
   Primary Cardiologist: Fransico Him, MD  Chart reviewed as part of pre-operative protocol coverage. Given past medical history and time since last visit, based on ACC/AHA guidelines, Bridget [name] would be at acceptable risk for the planned procedure without further cardiovascular testing.   Patient with diagnosis of afib on Eliquis for anticoagulation.    Procedure: LUMBAR INJECTIN Date of procedure: TBD  CHA2DS2-VASc Score = 4  This indicates a 4.8% annual risk of stroke. The patient's score is based upon: CHF History: No HTN History: Yes Diabetes History: No Stroke History: No Vascular Disease History: No Age Score: 2 Gender Score: 1     CrCl 84 ml/min Platelet count 302  Per office protocol, patient can hold Eliquis for 3 days prior to procedure.   Patient was advised that if she develops new symptoms prior to surgery to contact our office to arrange a follow-up appointment.  He verbalized understanding.  I will route this recommendation to the requesting party via Epic fax function and remove from pre-op pool.  Please call with questions.  Jossie Ng. Aaryn Sermon NP-C    04/05/2020, 10:12 AM [place] [address] Office [phone] Fax [phone]

## 2020-04-08 IMAGING — DX PORTABLE CHEST - 1 VIEW
1 series · 1 of 1 positions shown · non-contrast
Comparison: 02/04/2010

CLINICAL DATA: Chest pain

EXAM:
PORTABLE CHEST 1 VIEW

[chest ap]
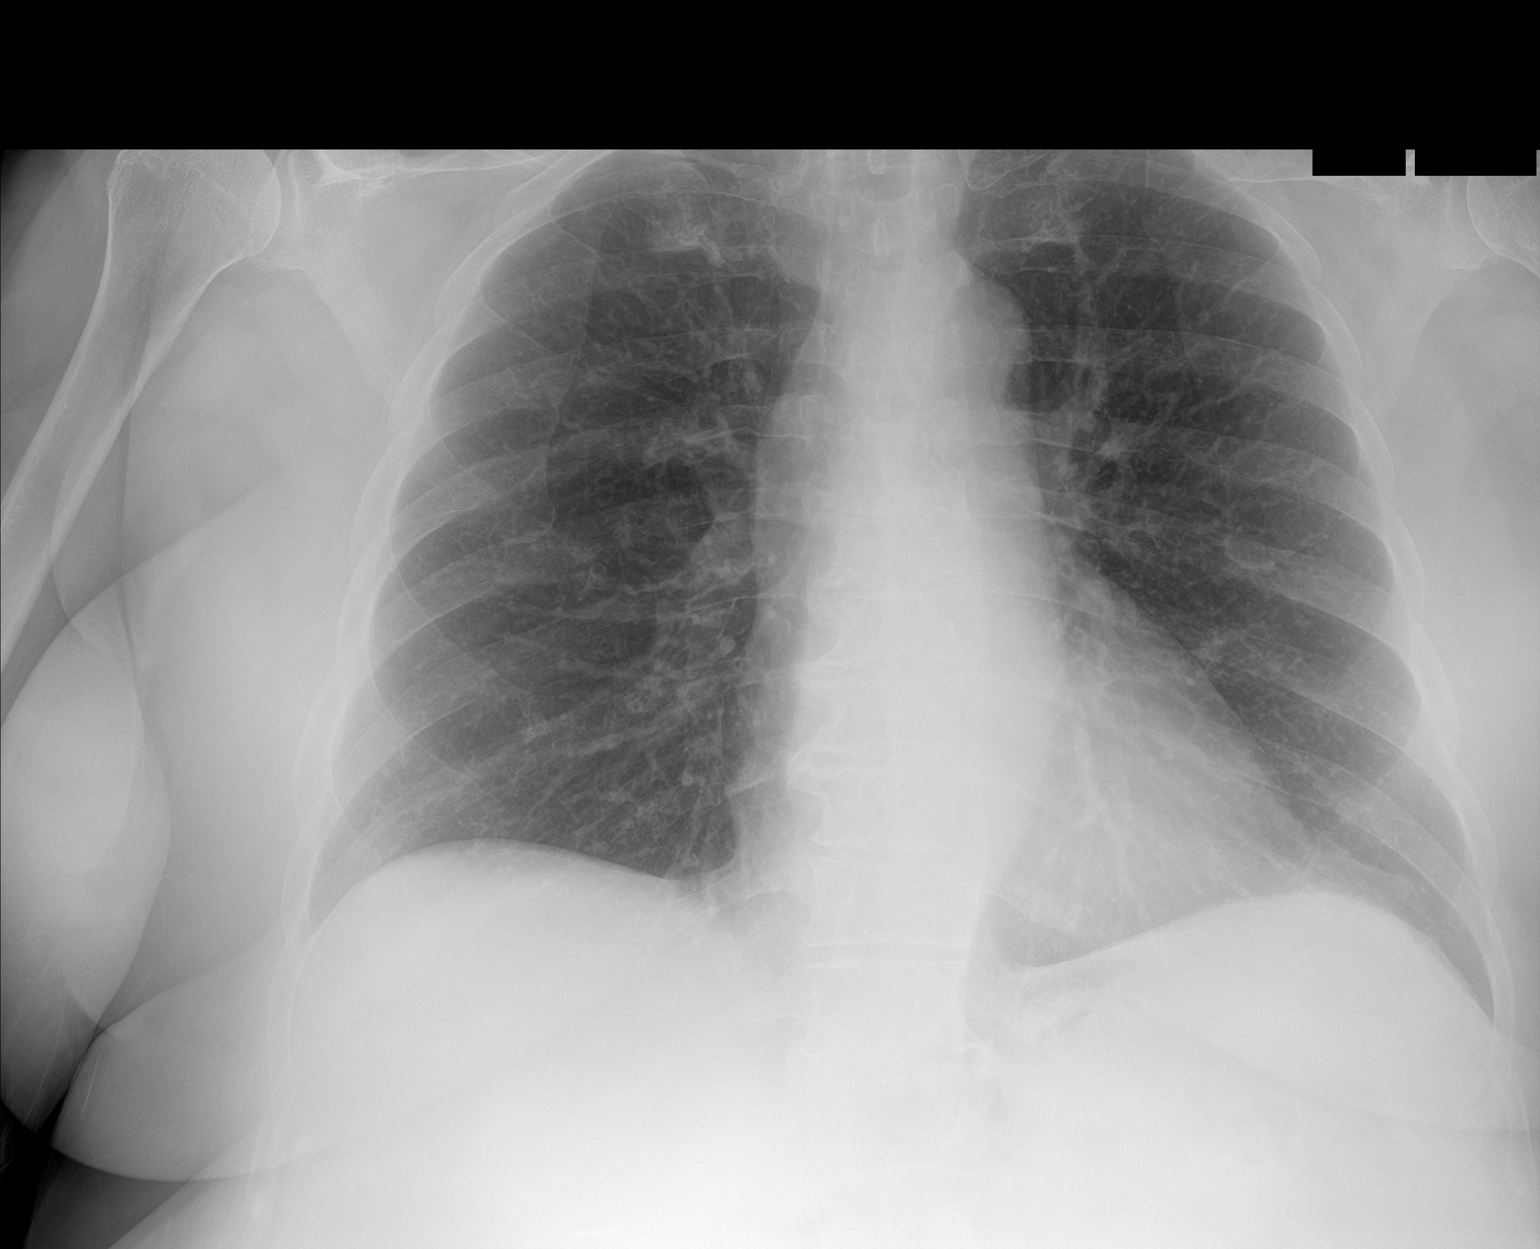

[1 of 1 positions shown; findings below may reference images not displayed]

FINDINGS: Heart and mediastinal contours are within normal limits. No focal
opacities or effusions. No acute bony abnormality.
IMPRESSION: No active disease.

## 2020-04-11 ENCOUNTER — Encounter: Payer: Medicare Other | Admitting: Physical Therapy

## 2020-04-12 ENCOUNTER — Other Ambulatory Visit: Payer: Self-pay

## 2020-04-12 ENCOUNTER — Ambulatory Visit
Admission: RE | Admit: 2020-04-12 | Discharge: 2020-04-12 | Disposition: A | Discharge status: Home / Self Care | Payer: Medicare Other | Source: Ambulatory Visit | Attending: Family Medicine | Admitting: Family Medicine

## 2020-04-12 DIAGNOSIS — M5416 Radiculopathy, lumbar region: Secondary | ICD-10-CM

## 2020-04-12 DIAGNOSIS — M47817 Spondylosis without myelopathy or radiculopathy, lumbosacral region: Secondary | ICD-10-CM | POA: Diagnosis not present

## 2020-04-12 MED ORDER — IOPAMIDOL (ISOVUE-M 200) INJECTION 41%
1.0000 mL | Freq: Once | INTRAMUSCULAR | Status: AC
  Administered 2020-04-12: 1 mL via EPIDURAL

## 2020-04-12 MED ORDER — METHYLPREDNISOLONE ACETATE 40 MG/ML INJ SUSP (RADIOLOG: 120.0000 mg | Freq: Once | INTRAMUSCULAR | Status: DC

## 2020-04-12 NOTE — Discharge Instructions (Signed)

## 2020-04-17 ENCOUNTER — Encounter: Payer: Self-pay | Admitting: Physical Therapy

## 2020-04-17 ENCOUNTER — Other Ambulatory Visit: Payer: Self-pay

## 2020-04-17 ENCOUNTER — Ambulatory Visit: Payer: Medicare Other | Attending: Family Medicine | Admitting: Physical Therapy

## 2020-04-17 DIAGNOSIS — M545 Low back pain, unspecified: Secondary | ICD-10-CM | POA: Insufficient documentation

## 2020-04-17 DIAGNOSIS — G8929 Other chronic pain: Secondary | ICD-10-CM | POA: Insufficient documentation

## 2020-04-17 DIAGNOSIS — R252 Cramp and spasm: Secondary | ICD-10-CM | POA: Diagnosis not present

## 2020-04-17 NOTE — Patient Instructions (Signed)
Hooklying Clamshell with Resistance - 1 x daily - 7 x weekly - 3 sets - 10 reps Shoulder extension with resistance - Neutral - 1 x daily - 7 x weekly - 3 sets - 10 reps

## 2020-04-17 NOTE — Therapy (Signed)
[facility] [address] [place], [place], [address] Phone: [phone]   Fax:  [phone]  Physical Therapy Treatment  Patient Details  Name: Bridget [name] MRN: [id number] Date of Birth: Feb 28, 1945 Referring Provider (PT): Hulan Saas, MD   Encounter Date: 04/17/2020   PT End of Session - 04/17/20 1147    Visit Number 2    Date for PT Re-Evaluation 05/15/20    Authorization Type [employer]    Progress Note Due on Visit 10    PT Start Time 1100    PT Stop Time 1145    PT Time Calculation (min) 45 min    Activity Tolerance Patient tolerated treatment well    Behavior During Therapy [facility] for tasks assessed/performed           Past Medical History:  Diagnosis Date  . Hypertension   . Seasonal allergies     Past Surgical History:  Procedure Laterality Date  . BREAST EXCISIONAL BIOPSY Left   . CHOLECYSTECTOMY  02/2010  . KNEE SURGERY    . OOPHORECTOMY  01/2003   RSO AT [facility]  . VAGINAL HYSTERECTOMY  01/2003   [facility] A&P REPAIR    There were no vitals filed for this visit.   Subjective Assessment - 04/17/20 1103    Subjective Had steroid injection 4/7. Has had some increased Rt LE cramping which she was told is a side effect of the injection.    Pertinent History a-fit, Lt RTC tear, DJD Lt knee-plans to have knee replacement soon    Limitations Standing;Walking    How long can you stand comfortably? 1-2 hours with after    How long can you walk comfortably? 1-2 hours with pain after    Diagnostic tests known lumbar DDD    Patient Stated Goals increased ease with housework, reduce LBP    Currently in Pain? Yes    Pain Score 3     Pain Location Back    Pain Orientation Right    Pain Descriptors / Indicators Discomfort    Pain Onset More than a month ago                             [facility] Adult PT Treatment/Exercise - 04/17/20 0001      Exercises   Exercises  Lumbar;Knee/Hip      Lumbar Exercises: Stretches   Active Hamstring Stretch Right;Left;1 rep;20 seconds    Active Hamstring Stretch Limitations seated    Single Knee to Chest Stretch Right;Left;1 rep;20 seconds    Single Knee to Chest Stretch Limitations with towel      Lumbar Exercises: Aerobic   Nustep lvl 1 x 4 min      Lumbar Exercises: Seated   Sit to Stand --   2x5 repetitions     Lumbar Exercises: Supine   Ab Set 10 reps;3 seconds    AB Set Limitations VC for continued breathing    Other Supine Lumbar Exercises bil shoulder ext with ab set; yellow tband      Knee/Hip Exercises: Seated   Knee/Hip Flexion x10 repetitions bil LE      Knee/Hip Exercises: Supine   Hip Adduction Isometric Both;1 set;10 reps                  PT Education - 04/17/20 1142    Education Details added hooklying clamshell and standing bil shoulder extension to HEP  Person(s) Educated Patient    Methods Explanation;Demonstration;Tactile cues;Verbal cues;Handout    Comprehension Verbalized understanding;Returned demonstration;Verbal cues required;Tactile cues required            PT Short Term Goals - 03/20/20 0841      PT SHORT TERM GOAL #1   Title be independent in initial HEP    Time 4    Period Weeks    Status New    Target Date 04/17/20      PT SHORT TERM GOAL #2   Title report < or = to 4/10 LBP with housework and yardwork    Time 4    Period Weeks    Status New    Target Date 04/17/20      PT SHORT TERM GOAL #3   Title report a 30% reduction in the frequency and intensity of LBP with daily tasks.    Time 4    Period Weeks    Status New    Target Date 04/17/20             PT Long Term Goals - 03/20/20 0843      PT LONG TERM GOAL #1   Title be independent in advanced HEP    Time 8    Period Weeks    Status New    Target Date 05/15/20      PT LONG TERM GOAL #2   Title report < or = to 2-3/10 LBP with yardwork and housework    Time 8    Period Weeks     Status New    Target Date 05/15/20      PT LONG TERM GOAL #3   Title improve FOTO to > or = to 63    Time 8    Period Weeks    Status New    Target Date 05/15/20      PT LONG TERM GOAL #4   Title verbalize and demonstrate body mechanics modification for lumbar protection with housework and yard work    Time 8    Period Weeks    Status New    Target Date 05/15/20      PT LONG TERM GOAL #5   Title report a 70% reduction in the frequency and intensity of LBP with daily tasks.    Time 8    Period Weeks    Status New    Target Date 05/15/20                 Plan - 04/17/20 1142    Clinical Impression Statement Patient presenting with 3/10 pain. Has had Rt  LE cramping since lumbar injection 4/7. Has been partially compliant with HEP. Noting impaired core strength as patient initially using valsalva manuever to elicit abdominal contraction. Noting impaired eccentric adduction control when performing spine clam indicating glute weakness as well. Requiring VC/TC for improved body mechanics when performing sit to stand activity. Would benefit from continued skilled intervention to address impairments for decreased pain and improved functional mobility.    Personal Factors and Comorbidities Age;Comorbidity 2    Comorbidities Lt knee pain, HTN, a-fib    Examination-Activity Limitations Locomotion Level;Squat;Stand    Examination-Participation Restrictions Community Activity;Shop    Rehab Potential Excellent    PT Frequency 2x / week    PT Duration 8 weeks    PT Treatment/Interventions ADLs/Self Care Home Management;Cryotherapy;Electrical Stimulation;Moist Heat;Functional mobility training;Gait training;Therapeutic activities;Therapeutic exercise;Neuromuscular re-education;Patient/family education;Manual techniques;Taping;Passive range of motion;Dry needling    PT Next Visit Plan progress  core and LE strengthening to patient tolerance; lumbar mobility as well    PT Home Exercise Plan  Access Code: [email or web address]    Consulted and Agree with Plan of Care Patient           Patient will benefit from skilled therapeutic intervention in order to improve the following deficits and impairments:  Abnormal gait,Decreased activity tolerance,Decreased strength,Pain,Postural dysfunction,Improper body mechanics,Decreased range of motion,Increased muscle spasms,Decreased endurance  Visit Diagnosis: Chronic bilateral low back pain, unspecified whether sciatica present  Cramp and spasm     Problem List Patient Active Problem List   Diagnosis Date Noted  . Degenerative arthritis of left knee 11/24/2019  . Secondary hypercoagulable state ([place]) 01/26/2019  . Greater trochanteric bursitis of right hip 09/23/2018  . Lumbar radiculopathy 08/12/2018  . Gluteal tendonitis of right buttock 07/29/2018  . Atrial fibrillation ([place]) 07/21/2018  . Arthritis of left acromioclavicular joint 04/26/2018  . Partial nontraumatic tear of left rotator cuff 04/26/2018  . Hypertension   . Seasonal allergies     Everardo All PT, DPT  04/17/20 11:53 AM  [facility] [address], [place], [place], [address] Phone: [phone]   Fax:  [phone]  Name: Bridget [name] MRN: [id number] Date of Birth: [birth year]

## 2020-04-19 ENCOUNTER — Ambulatory Visit: Payer: Medicare Other

## 2020-04-19 ENCOUNTER — Other Ambulatory Visit: Payer: Self-pay

## 2020-04-19 DIAGNOSIS — M545 Low back pain, unspecified: Secondary | ICD-10-CM | POA: Diagnosis not present

## 2020-04-19 DIAGNOSIS — R252 Cramp and spasm: Secondary | ICD-10-CM | POA: Diagnosis not present

## 2020-04-19 DIAGNOSIS — G8929 Other chronic pain: Secondary | ICD-10-CM

## 2020-04-19 NOTE — Therapy (Signed)
[facility] [address] [place], [place], [address] Phone: [phone]   Fax:  [phone]  Physical Therapy Treatment  Patient Details  Name: Bridget [name] MRN: [id number] Date of Birth: 12-11-1945 Referring Provider (PT): Hulan Saas, MD   Encounter Date: 04/19/2020   PT End of Session - 04/19/20 1136    Visit Number 3    Date for PT Re-Evaluation 05/15/20    Authorization Type [employer]na Medicare    Progress Note Due on Visit 10    PT Start Time 1059    PT Stop Time 1134   Rt leg was cramping with activity   PT Time Calculation (min) 35 min    Activity Tolerance Patient tolerated treatment well;Other (comment)    Behavior During Therapy WFL for tasks assessed/performed           Past Medical History:  Diagnosis Date  . Hypertension   . Seasonal allergies     Past Surgical History:  Procedure Laterality Date  . BREAST EXCISIONAL BIOPSY Left   . CHOLECYSTECTOMY  02/2010  . KNEE SURGERY    . OOPHORECTOMY  01/2003   RSO AT [facility]  . VAGINAL HYSTERECTOMY  01/2003   [facility] A&P REPAIR    There were no vitals filed for this visit.   Subjective Assessment - 04/19/20 1104    Subjective My back is feeling better.  85% better overall.    Currently in Pain? Yes    Pain Score 3     Pain Location Back    Pain Orientation Right    Pain Descriptors / Indicators Discomfort    Pain Type Chronic pain    Pain Onset More than a month ago                             [facility] Adult PT Treatment/Exercise - 04/19/20 0001      Lumbar Exercises: Stretches   Active Hamstring Stretch Right;Left;20 seconds;2 reps    Active Hamstring Stretch Limitations seated      Lumbar Exercises: Aerobic   Nustep Level 1 x 6 min- PT present to discuss progress      Lumbar Exercises: Standing   Shoulder Extension Strengthening;Both;20 reps;Theraband    Theraband Level (Shoulder Extension) Level 2 (Red)       Lumbar Exercises: Supine   Ab Set 10 reps;3 seconds    AB Set Limitations with ball squeeze    Clam 20 reps    Clam Limitations green loop      Knee/Hip Exercises: Seated   Sit to Sand 2 sets;10 reps   holding 5# kettle bell                   PT Short Term Goals - 04/19/20 1109      PT SHORT TERM GOAL #1   Title be independent in initial HEP    Status On-going      PT SHORT TERM GOAL #2   Title report < or = to 4/10 LBP with housework and yardwork    Baseline 4/10 with grocery shopping    Status On-going      PT SHORT TERM GOAL #3   Title report a 30% reduction in the frequency and intensity of LBP with daily tasks.    Baseline 85%    Status Achieved             PT Long Term Goals - 03/20/20  0843      PT LONG TERM GOAL #1   Title be independent in advanced HEP    Time 8    Period Weeks    Status New    Target Date 05/15/20      PT LONG TERM GOAL #2   Title report < or = to 2-3/10 LBP with yardwork and housework    Time 8    Period Weeks    Status New    Target Date 05/15/20      PT LONG TERM GOAL #3   Title improve FOTO to > or = to 63    Time 8    Period Weeks    Status New    Target Date 05/15/20      PT LONG TERM GOAL #4   Title verbalize and demonstrate body mechanics modification for lumbar protection with housework and yard work    Time 8    Period Weeks    Status New    Target Date 05/15/20      PT LONG TERM GOAL #5   Title report a 70% reduction in the frequency and intensity of LBP with daily tasks.    Time 8    Period Weeks    Status New    Target Date 05/15/20                 Plan - 04/19/20 1121    Clinical Impression Statement Pt arrives today with mild lumbar pain due to doing a lot yesterday.  Pt reports 85% overall improvement in symptoms since the start of care.  Pt was able to demonstrate exercises issued last session. Pt required few verbal cues for scapular alignment and core engagement with standing shoulder  extension.  Pt with reduced Valsalva maneuver today with core activation.  Pt requires pad on seat for sit to stand and verbal cues to engage core and quads to reduce momentum.  Pt will continue to benefit from skilled PT to improve lumbopelvic strength, flexibility and address pain as needed to improve function.    PT Frequency 2x / week    PT Duration 8 weeks    PT Treatment/Interventions ADLs/Self Care Home Management;Cryotherapy;Electrical Stimulation;Moist Heat;Functional mobility training;Gait training;Therapeutic activities;Therapeutic exercise;Neuromuscular re-education;Patient/family education;Manual techniques;Taping;Passive range of motion;Dry needling    PT Next Visit Plan progress core and LE strengthening to patient tolerance; lumbar mobility as well    PT Home Exercise Plan Access Code: [email or web address]    Recommended Other Services initial cert is signed    Consulted and Agree with Plan of Care Patient           Patient will benefit from skilled therapeutic intervention in order to improve the following deficits and impairments:  Abnormal gait,Decreased activity tolerance,Decreased strength,Pain,Postural dysfunction,Improper body mechanics,Decreased range of motion,Increased muscle spasms,Decreased endurance  Visit Diagnosis: Chronic bilateral low back pain, unspecified whether sciatica present  Cramp and spasm     Problem List Patient Active Problem List   Diagnosis Date Noted  . Degenerative arthritis of left knee 11/24/2019  . Secondary hypercoagulable state ([place]) 01/26/2019  . Greater trochanteric bursitis of right hip 09/23/2018  . Lumbar radiculopathy 08/12/2018  . Gluteal tendonitis of right buttock 07/29/2018  . Atrial fibrillation ([place]) 07/21/2018  . Arthritis of left acromioclavicular joint 04/26/2018  . Partial nontraumatic tear of left rotator cuff 04/26/2018  . Hypertension   . Seasonal allergies     Sigurd Sos, PT 04/19/20 11:38 AM  [facility]  [place] [address] [place], [place], [address] Phone: [phone]   Fax:  [phone]  Name: Bridget [name] MRN: [id number] Date of Birth: Mar 07, 1945

## 2020-04-25 ENCOUNTER — Other Ambulatory Visit: Payer: Self-pay

## 2020-04-25 ENCOUNTER — Ambulatory Visit: Payer: Medicare Other | Admitting: Physical Therapy

## 2020-04-25 ENCOUNTER — Encounter: Payer: Self-pay | Admitting: Physical Therapy

## 2020-04-25 DIAGNOSIS — G8929 Other chronic pain: Secondary | ICD-10-CM | POA: Diagnosis not present

## 2020-04-25 DIAGNOSIS — M545 Low back pain, unspecified: Secondary | ICD-10-CM | POA: Diagnosis not present

## 2020-04-25 DIAGNOSIS — R252 Cramp and spasm: Secondary | ICD-10-CM

## 2020-04-25 NOTE — Therapy (Signed)
[facility] [address], [place], [place], [address] Phone: [phone]   Fax:  [phone]  Physical Therapy Treatment  Patient Details  Name: Bridget [name] MRN: [id number] Date of Birth: 1945-02-11 Referring Provider (PT): Hulan Saas, MD   Encounter Date: 04/25/2020   PT End of Session - 04/25/20 1702    Visit Number 4    Date for PT Re-Evaluation 05/15/20    Authorization Type [employer] Medicare    Progress Note Due on Visit 10    PT Start Time 1231    PT Stop Time 1311    PT Time Calculation (min) 40 min    Activity Tolerance Patient tolerated treatment well;No increased pain    Behavior During Therapy WFL for tasks assessed/performed           Past Medical History:  Diagnosis Date  . Hypertension   . Seasonal allergies     Past Surgical History:  Procedure Laterality Date  . BREAST EXCISIONAL BIOPSY Left   . CHOLECYSTECTOMY  02/2010  . KNEE SURGERY    . OOPHORECTOMY  01/2003   RSO AT [facility]  . VAGINAL HYSTERECTOMY  01/2003   [facility] A&P REPAIR    There were no vitals filed for this visit.   Subjective Assessment - 04/25/20 1233    Subjective "i was able to do some vacuuming and some mopping this weekend"    Pertinent History a-fit, Lt RTC tear, DJD Lt knee-plans to have knee replacement soon    Limitations Standing;Walking    How long can you stand comfortably? 1-2 hours with after    How long can you walk comfortably? 1-2 hours with pain after    Diagnostic tests known lumbar DDD    Patient Stated Goals increased ease with housework, reduce LBP    Currently in Pain? Yes    Pain Score 3     Pain Location Back    Pain Orientation Right    Pain Descriptors / Indicators Discomfort    Pain Type Chronic pain    Pain Frequency Intermittent                             [facility] Adult PT Treatment/Exercise - 04/25/20 0001      Lumbar Exercises: Stretches   Active Hamstring  Stretch Right;Left;20 seconds;2 reps    Active Hamstring Stretch Limitations seated      Lumbar Exercises: Standing   Row 20 reps    Row Limitations total gym 15#    Shoulder Extension Strengthening;Both;20 reps;Theraband    Theraband Level (Shoulder Extension) Level 2 (Red)      Lumbar Exercises: Seated   Sit to Stand 10 reps    Sit to Stand Limitations x5 with 10# kb; x5 yellow loop at knees    Other Seated Lumbar Exercises foam roll press for ab set x19 reps      Lumbar Exercises: Supine   Ab Set 10 reps;3 seconds    AB Set Limitations with march x5 reps    Other Supine Lumbar Exercises hip flexion isometric; 10 x 3s bil LE      Knee/Hip Exercises: Seated   Sit to Sand --   x5 with 5# kb; x5 yellow loop at knees                   PT Short Term Goals - 04/25/20 1659      PT SHORT TERM  GOAL #1   Title be independent in initial HEP    Time 4    Period Weeks    Status On-going    Target Date 04/17/20      PT SHORT TERM GOAL #2   Title report < or = to 4/10 LBP with housework and yardwork    Baseline < 4/10 with vacuuming and mopping this weekend    Time 4    Period Weeks    Status On-going    Target Date 04/17/20             PT Long Term Goals - 03/20/20 0843      PT LONG TERM GOAL #1   Title be independent in advanced HEP    Time 8    Period Weeks    Status New    Target Date 05/15/20      PT LONG TERM GOAL #2   Title report < or = to 2-3/10 LBP with yardwork and housework    Time 8    Period Weeks    Status New    Target Date 05/15/20      PT LONG TERM GOAL #3   Title improve FOTO to > or = to 63    Time 8    Period Weeks    Status New    Target Date 05/15/20      PT LONG TERM GOAL #4   Title verbalize and demonstrate body mechanics modification for lumbar protection with housework and yard work    Time 8    Period Weeks    Status New    Target Date 05/15/20      PT LONG TERM GOAL #5   Title report a 70% reduction in the  frequency and intensity of LBP with daily tasks.    Time 8    Period Weeks    Status New    Target Date 05/15/20                 Plan - 04/25/20 1656    Clinical Impression Statement Patient arrives today reporting improved ability to complete household ADLs as she was able to vacuum and mop without significant increase in lumbar pain. Functional mobility improved as patient completing sit to stand from low table without UE assist. Patient reporting increased knee pain during activity initially. Therapist placing band at knees and cuing for decreased hip adduction. Patient compliant with cuing and reporting decreased knee pain. Would benefit from continued skilled intervention for improved functional activity tolerance.    Personal Factors and Comorbidities Age;Comorbidity 2    Comorbidities Lt knee pain, HTN, a-fib    Examination-Activity Limitations Locomotion Level;Squat;Stand    Examination-Participation Restrictions Community Activity;Shop    Rehab Potential Excellent    PT Frequency 2x / week    PT Duration 8 weeks    PT Treatment/Interventions ADLs/Self Care Home Management;Cryotherapy;Electrical Stimulation;Moist Heat;Functional mobility training;Gait training;Therapeutic activities;Therapeutic exercise;Neuromuscular re-education;Patient/family education;Manual techniques;Taping;Passive range of motion;Dry needling;Joint Manipulations    PT Next Visit Plan continue core and LE strength progression adding functional components as appropriate; continue lumbar mobility to patient tolerance    PT Home Exercise Plan Access Code: [email or web address]    Consulted and Agree with Plan of Care Patient           Patient will benefit from skilled therapeutic intervention in order to improve the following deficits and impairments:  Abnormal gait,Decreased activity tolerance,Decreased strength,Pain,Postural dysfunction,Improper body mechanics,Decreased range of motion,Increased muscle  spasms,Decreased endurance  Visit  Diagnosis: Chronic bilateral low back pain, unspecified whether sciatica present  Cramp and spasm     Problem List Patient Active Problem List   Diagnosis Date Noted  . Degenerative arthritis of left knee 11/24/2019  . Secondary hypercoagulable state ([place]) 01/26/2019  . Greater trochanteric bursitis of right hip 09/23/2018  . Lumbar radiculopathy 08/12/2018  . Gluteal tendonitis of right buttock 07/29/2018  . Atrial fibrillation ([place]) 07/21/2018  . Arthritis of left acromioclavicular joint 04/26/2018  . Partial nontraumatic tear of left rotator cuff 04/26/2018  . Hypertension   . Seasonal allergies    Everardo All PT, DPT  04/25/20 5:03 PM  [facility] [address], [place], [place], [address] Phone: [phone]   Fax:  [phone]  Name: Bridget [name] MRN: [id number] Date of Birth: [birth year]

## 2020-04-26 ENCOUNTER — Encounter: Payer: Medicare Other | Admitting: Physical Therapy

## 2020-04-30 ENCOUNTER — Other Ambulatory Visit: Payer: Self-pay

## 2020-04-30 ENCOUNTER — Ambulatory Visit: Payer: Medicare Other

## 2020-04-30 DIAGNOSIS — G8929 Other chronic pain: Secondary | ICD-10-CM | POA: Diagnosis not present

## 2020-04-30 DIAGNOSIS — R252 Cramp and spasm: Secondary | ICD-10-CM | POA: Diagnosis not present

## 2020-04-30 DIAGNOSIS — M545 Low back pain, unspecified: Secondary | ICD-10-CM

## 2020-04-30 NOTE — Therapy (Signed)
[facility] [address], [place], [place], [address] Phone: [phone]   Fax:  [phone]  Physical Therapy Treatment  Patient Details  Name: Bridget [name] MRN: [id number] Date of Birth: Oct 16, 1945 Referring Provider (PT): Hulan Saas, MD   Encounter Date: 04/30/2020   PT End of Session - 04/30/20 1053    Visit Number 5    Date for PT Re-Evaluation 05/15/20    Authorization Type [employer]    Progress Note Due on Visit 10    PT Start Time 1014    PT Stop Time 3716    PT Time Calculation (min) 44 min    Activity Tolerance Patient tolerated treatment well;No increased pain    Behavior During Therapy WFL for tasks assessed/performed           Past Medical History:  Diagnosis Date  . Hypertension   . Seasonal allergies     Past Surgical History:  Procedure Laterality Date  . BREAST EXCISIONAL BIOPSY Left   . CHOLECYSTECTOMY  02/2010  . KNEE SURGERY    . OOPHORECTOMY  01/2003   RSO AT [facility]  . VAGINAL HYSTERECTOMY  01/2003   [facility] A&P REPAIR    There were no vitals filed for this visit.   Subjective Assessment - 04/30/20 1018    Subjective It feels really good today.  After I was here I had some increased back pain. I did leave here and go shopping.  I did my stretches and it went away by the next day.  I feel 85-90% overall improvement.    Patient Stated Goals increased ease with housework, reduce LBP    Currently in Pain? No/denies                             [facility] Adult PT Treatment/Exercise - 04/30/20 0001      Lumbar Exercises: Stretches   Active Hamstring Stretch Right;Left;20 seconds;2 reps    Active Hamstring Stretch Limitations seated    Lower Trunk Rotation 3 reps;20 seconds    Piriformis Stretch Left;Right;2 reps;20 seconds    Piriformis Stretch Limitations max UE assistance to cross leg      Lumbar Exercises: Aerobic   Nustep Level 1 x 6 min- PT present  to discuss progress      Lumbar Exercises: Standing   Row 20 reps    Theraband Level (Row) Level 2 (Red)    Shoulder Extension Strengthening;Both;20 reps;Theraband    Theraband Level (Shoulder Extension) Level 2 (Red)      Lumbar Exercises: Seated   Other Seated Lumbar Exercises foam roll press for ab set x20  reps      Lumbar Exercises: Supine   Clam 20 reps    Clam Limitations green loop      Knee/Hip Exercises: Seated   Sit to Sand 2 sets;10 reps   holding 5# kettle bell                   PT Short Term Goals - 04/25/20 1659      PT SHORT TERM GOAL #1   Title be independent in initial HEP    Time 4    Period Weeks    Status On-going    Target Date 04/17/20      PT SHORT TERM GOAL #2   Title report < or = to 4/10 LBP with housework and yardwork    Baseline < 4/10 with  vacuuming and mopping this weekend    Time 4    Period Weeks    Status On-going    Target Date 04/17/20             PT Long Term Goals - 03/20/20 0843      PT LONG TERM GOAL #1   Title be independent in advanced HEP    Time 8    Period Weeks    Status New    Target Date 05/15/20      PT LONG TERM GOAL #2   Title report < or = to 2-3/10 LBP with yardwork and housework    Time 8    Period Weeks    Status New    Target Date 05/15/20      PT LONG TERM GOAL #3   Title improve FOTO to > or = to 63    Time 8    Period Weeks    Status New    Target Date 05/15/20      PT LONG TERM GOAL #4   Title verbalize and demonstrate body mechanics modification for lumbar protection with housework and yard work    Time 8    Period Weeks    Status New    Target Date 05/15/20      PT LONG TERM GOAL #5   Title report a 70% reduction in the frequency and intensity of LBP with daily tasks.    Time 8    Period Weeks    Status New    Target Date 05/15/20                 Plan - 04/30/20 1031    Clinical Impression Statement Patient arrives today reporting some increased pain after  exercises last session followed by going shopping after.  Pt was able to stretch and pain resolved the next day. Pt reports 85% improvement in symptoms since the start of care.  Functional mobility improved as patient completing sit to stand from low table without UE assist or elevated surface. Pt denied any LBP or knee pain with exercise today. Hip flexibility into ER is limited bilaterally with seated figure 4 stretch.  PT provided verbal cues for relaxation of upper traps with foam press and hip abduction with sit to stand. Pt will continue benefit from skilled intervention for improved functional activity tolerance.    PT Frequency 2x / week    PT Duration 8 weeks    PT Treatment/Interventions ADLs/Self Care Home Management;Cryotherapy;Electrical Stimulation;Moist Heat;Functional mobility training;Gait training;Therapeutic activities;Therapeutic exercise;Neuromuscular re-education;Patient/family education;Manual techniques;Taping;Passive range of motion;Dry needling;Joint Manipulations    PT Next Visit Plan continue core and LE strength progression adding functional components as appropriate; continue lumbar mobility to patient tolerance    PT Home Exercise Plan Access Code: [email or web address]    Consulted and Agree with Plan of Care Patient           Patient will benefit from skilled therapeutic intervention in order to improve the following deficits and impairments:  Abnormal gait,Decreased activity tolerance,Decreased strength,Pain,Postural dysfunction,Improper body mechanics,Decreased range of motion,Increased muscle spasms,Decreased endurance  Visit Diagnosis: Cramp and spasm  Chronic bilateral low back pain, unspecified whether sciatica present     Problem List Patient Active Problem List   Diagnosis Date Noted  . Degenerative arthritis of left knee 11/24/2019  . Secondary hypercoagulable state ([place]) 01/26/2019  . Greater trochanteric bursitis of right hip 09/23/2018  . Lumbar  radiculopathy 08/12/2018  . Gluteal tendonitis of right buttock 07/29/2018  .  Atrial fibrillation ([place]) 07/21/2018  . Arthritis of left acromioclavicular joint 04/26/2018  . Partial nontraumatic tear of left rotator cuff 04/26/2018  . Hypertension   . Seasonal allergies     Sigurd Sos, PT 04/30/20 10:55 AM  [facility] [address], [place], [place], [address] Phone: [phone]   Fax:  [phone]  Name: Bridget [name] MRN: [id number] Date of Birth: 1945-08-19

## 2020-05-02 ENCOUNTER — Ambulatory Visit: Payer: Medicare Other | Admitting: Physical Therapy

## 2020-05-02 ENCOUNTER — Other Ambulatory Visit: Payer: Self-pay

## 2020-05-02 ENCOUNTER — Encounter: Payer: Self-pay | Admitting: Physical Therapy

## 2020-05-02 DIAGNOSIS — M545 Low back pain, unspecified: Secondary | ICD-10-CM | POA: Diagnosis not present

## 2020-05-02 DIAGNOSIS — R252 Cramp and spasm: Secondary | ICD-10-CM | POA: Diagnosis not present

## 2020-05-02 DIAGNOSIS — G8929 Other chronic pain: Secondary | ICD-10-CM | POA: Diagnosis not present

## 2020-05-02 NOTE — Therapy (Signed)
[facility] [address] [place], [place], [address] Phone: [phone]   Fax:  [phone]  Physical Therapy Treatment  Patient Details  Name: Bridget [name] MRN: [id number] Date of Birth: [birth year] Referring Provider (PT): Hulan Saas, MD   Encounter Date: 05/02/2020   PT End of Session - 05/02/20 1324    Visit Number 6    Date for PT Re-Evaluation 05/15/20    Authorization Type [employer] Medicare    Progress Note Due on Visit 10    PT Start Time 1015    PT Stop Time 1053    PT Time Calculation (min) 38 min    Activity Tolerance Patient tolerated treatment well;No increased pain    Behavior During Therapy WFL for tasks assessed/performed           Past Medical History:  Diagnosis Date  . Hypertension   . Seasonal allergies     Past Surgical History:  Procedure Laterality Date  . BREAST EXCISIONAL BIOPSY Left   . CHOLECYSTECTOMY  02/2010  . KNEE SURGERY    . OOPHORECTOMY  01/2003   RSO AT [facility]  . VAGINAL HYSTERECTOMY  01/2003   [facility] A&P REPAIR    There were no vitals filed for this visit.   Subjective Assessment - 05/02/20 1207    Subjective Continues to notice improvement. Was able to pick up potted plants from floor level yesterday and relocate them outside without increased pain.    Pertinent History a-fit, Lt RTC tear, DJD Lt knee-plans to have knee replacement soon    Limitations Standing;Walking    How long can you stand comfortably? 1-2 hours with after    How long can you walk comfortably? 1-2 hours with pain after    Diagnostic tests known lumbar DDD    Patient Stated Goals increased ease with housework, reduce LBP                             [facility] Adult PT Treatment/Exercise - 05/02/20 0001      Lumbar Exercises: Stretches   Active Hamstring Stretch Right;Left;20 seconds;2 reps    Active Hamstring Stretch Limitations seated    Lower Trunk Rotation 5 reps;10  seconds    Piriformis Stretch Left;Right;2 reps;20 seconds    Piriformis Stretch Limitations active assisted with PT assist      Lumbar Exercises: Aerobic   Nustep Level 1 x 8 min- PT present to discuss progress      Lumbar Exercises: Standing   Row Both    Theraband Level (Row) Level 3 (Green)   2 x 10 repetitions   Shoulder Extension Strengthening;Both;20 reps;Theraband    Theraband Level (Shoulder Extension) Level 2 (Red)      Lumbar Exercises: Seated   Sit to Stand 10 reps    Other Seated Lumbar Exercises foam roll press for ab set x20  reps      Lumbar Exercises: Supine   Ab Set 15 reps;3 seconds    Clam 20 reps    Clam Limitations yellow loop      Knee/Hip Exercises: Seated   Sit to Sand 1 set;10 reps   5# kettlebell                   PT Short Term Goals - 05/02/20 1053      PT SHORT TERM GOAL #2   Title report < or = to 4/10 LBP with housework and  yardwork    Time 4    Period Weeks    Status Achieved   No pain with lifitng and relocated potted plants yesterday            PT Long Term Goals - 05/02/20 1324      PT LONG TERM GOAL #1   Title be independent in advanced HEP    Time 8    Period Weeks    Status On-going                 Plan - 05/02/20 1325    Clinical Impression Statement Patient reports continued pain relief when transitioning sit to stand with cuing for decreased hip adduction. Continues to require intermittent cuing to avoid valsalva manuever when performing ab set exercise in supine. Tolerating increased resistance of select exercises without increased pain indicating improved activity tolerance. Would benefit from continued skilled intervention for improved activity tolerance and decreased pain.    Personal Factors and Comorbidities Age;Comorbidity 2    Comorbidities Lt knee pain, HTN, a-fib    Examination-Activity Limitations Locomotion Level;Squat;Stand    Examination-Participation Restrictions Community Activity;Shop     Rehab Potential Excellent    PT Frequency 2x / week    PT Duration 8 weeks    PT Treatment/Interventions ADLs/Self Care Home Management;Cryotherapy;Electrical Stimulation;Moist Heat;Functional mobility training;Gait training;Therapeutic activities;Therapeutic exercise;Neuromuscular re-education;Patient/family education;Manual techniques;Taping;Passive range of motion;Dry needling;Joint Manipulations    PT Next Visit Plan continue to add functional components for core and LE strength; lumbar mobility    PT Home Exercise Plan Access Code: [email or web address]    Consulted and Agree with Plan of Care Patient           Patient will benefit from skilled therapeutic intervention in order to improve the following deficits and impairments:  Abnormal gait,Decreased activity tolerance,Decreased strength,Pain,Postural dysfunction,Improper body mechanics,Decreased range of motion,Increased muscle spasms,Decreased endurance  Visit Diagnosis: Cramp and spasm  Chronic bilateral low back pain, unspecified whether sciatica present     Problem List Patient Active Problem List   Diagnosis Date Noted  . Degenerative arthritis of left knee 11/24/2019  . Secondary hypercoagulable state ([place]) 01/26/2019  . Greater trochanteric bursitis of right hip 09/23/2018  . Lumbar radiculopathy 08/12/2018  . Gluteal tendonitis of right buttock 07/29/2018  . Atrial fibrillation ([place]) 07/21/2018  . Arthritis of left acromioclavicular joint 04/26/2018  . Partial nontraumatic tear of left rotator cuff 04/26/2018  . Hypertension   . Seasonal allergies    Everardo All PT, DPT  05/02/20 1:28 PM    [facility] [address] [place], [place], [address] Phone: [phone]   Fax:  [phone]  Name: Bridget [name] MRN: [id number] Date of Birth: Apr 18, 1945

## 2020-05-07 ENCOUNTER — Ambulatory Visit: Payer: Medicare Other | Attending: Family Medicine

## 2020-05-07 ENCOUNTER — Other Ambulatory Visit: Payer: Self-pay

## 2020-05-07 DIAGNOSIS — M545 Low back pain, unspecified: Secondary | ICD-10-CM | POA: Diagnosis not present

## 2020-05-07 DIAGNOSIS — R252 Cramp and spasm: Secondary | ICD-10-CM | POA: Insufficient documentation

## 2020-05-07 DIAGNOSIS — G8929 Other chronic pain: Secondary | ICD-10-CM | POA: Diagnosis not present

## 2020-05-07 NOTE — Therapy (Signed)
[facility] [address], [place], [place], [address] Phone: [phone]   Fax:  [phone]  Physical Therapy Treatment  Patient Details  Name: Bridget [name] MRN: [id number] Date of Birth: [birth year] Referring Provider (PT): Hulan Saas, MD   Encounter Date: 05/07/2020   PT End of Session - 05/07/20 1519    Visit Number 7    Date for PT Re-Evaluation 05/15/20    Authorization Type [employer] Medicare    Progress Note Due on Visit 10    PT Start Time 1451    PT Stop Time 1528    PT Time Calculation (min) 37 min    Activity Tolerance Patient tolerated treatment well;No increased pain    Behavior During Therapy WFL for tasks assessed/performed           Past Medical History:  Diagnosis Date  . Hypertension   . Seasonal allergies     Past Surgical History:  Procedure Laterality Date  . BREAST EXCISIONAL BIOPSY Left   . CHOLECYSTECTOMY  02/2010  . KNEE SURGERY    . OOPHORECTOMY  01/2003   RSO [facility]  . VAGINAL HYSTERECTOMY  01/2003   [facility] A&P REPAIR    There were no vitals filed for this visit.   Subjective Assessment - 05/07/20 1450    Subjective I just feel a twinge on the Rt side of my back.  I've been busy ouside.  90% overall improvement in symptoms.    Patient Stated Goals increased ease with housework, reduce LBP    Currently in Pain? No/denies                             [facility] Adult PT Treatment/Exercise - 05/07/20 0001      Lumbar Exercises: Stretches   Active Hamstring Stretch Right;Left;20 seconds;2 reps    Active Hamstring Stretch Limitations seated    Lower Trunk Rotation 5 reps;10 seconds    Piriformis Stretch Left;Right;2 reps;20 seconds    Piriformis Stretch Limitations active assisted with PT assist      Lumbar Exercises: Aerobic   Nustep Level 1 x 8 min- PT present to discuss progress      Lumbar Exercises: Supine   Ab Set 15 reps;3 seconds    AB Set  Limitations with ball squeeze    Clam 20 reps    Clam Limitations yellow loop    Other Supine Lumbar Exercises green band: horizontal abduction and ER with abdominal bracing 2x10 each      Knee/Hip Exercises: Seated   Sit to Sand 1 set;10 reps;without UE support   5# kettlebell                   PT Short Term Goals - 05/02/20 1053      PT SHORT TERM GOAL #2   Title report < or = to 4/10 LBP with housework and yardwork    Time 4    Period Weeks    Status Achieved   No pain with lifitng and relocated potted plants yesterday            PT Long Term Goals - 05/07/20 1519      PT LONG TERM GOAL #1   Title be independent in advanced HEP    Status On-going      PT LONG TERM GOAL #2   Title report < or = to 2-3/10 LBP with yardwork and housework  Status On-going      PT LONG TERM GOAL #4   Title verbalize and demonstrate body mechanics modification for lumbar protection with housework and yard work    Status Achieved      PT LONG TERM GOAL #5   Title report a 70% reduction in the frequency and intensity of LBP with daily tasks.    Baseline 90%    Status Achieved                 Plan - 05/07/20 1523    Clinical Impression Statement Pt reports 90% overall pain reduction since the start of care.  Pt denies any pain over the past few days. Pt continues to require intermittent cuing for breathing when performing ab set exercise in supine.  Pt is making body mechanics modifications for home tasks to allow for improved tolerance. Pt demonstrates fatigue with lumbopelvic strength exercises.  Pt will continue to benefit from skilled PT to address flexibility, strength and endurance.    PT Treatment/Interventions ADLs/Self Care Home Management;Cryotherapy;Electrical Stimulation;Moist Heat;Functional mobility training;Gait training;Therapeutic activities;Therapeutic exercise;Neuromuscular re-education;Patient/family education;Manual techniques;Taping;Passive range of  motion;Dry needling;Joint Manipulations    PT Next Visit Plan continue to add functional components for core and LE strength; lumbar mobility.  Pt going to see MD on Friday- send not.  Possible early re-cert to reduce frequency since pt is doing well.  Re-cert currently set for 05/15/20 but can be done next visit since pt is seeing the MD.    PT Home Exercise Plan Access Code: [email or web address]    Consulted and Agree with Plan of Care Patient           Patient will benefit from skilled therapeutic intervention in order to improve the following deficits and impairments:  Abnormal gait,Decreased activity tolerance,Decreased strength,Pain,Postural dysfunction,Improper body mechanics,Decreased range of motion,Increased muscle spasms,Decreased endurance  Visit Diagnosis: Chronic bilateral low back pain, unspecified whether sciatica present  Cramp and spasm     Problem List Patient Active Problem List   Diagnosis Date Noted  . Degenerative arthritis of left knee 11/24/2019  . Secondary hypercoagulable state ([place]) 01/26/2019  . Greater trochanteric bursitis of right hip 09/23/2018  . Lumbar radiculopathy 08/12/2018  . Gluteal tendonitis of right buttock 07/29/2018  . Atrial fibrillation ([place]) 07/21/2018  . Arthritis of left acromioclavicular joint 04/26/2018  . Partial nontraumatic tear of left rotator cuff 04/26/2018  . Hypertension   . Seasonal allergies      Sigurd Sos, PT 05/07/20 3:25 PM  [facility] [address], [place], [place], [address] Phone: [phone]   Fax:  [phone]  Name: ELNORE COSENS MRN: [id number] Date of Birth: 1945-04-20

## 2020-05-09 ENCOUNTER — Ambulatory Visit: Payer: Medicare Other | Admitting: Physical Therapy

## 2020-05-09 ENCOUNTER — Other Ambulatory Visit: Payer: Self-pay

## 2020-05-09 DIAGNOSIS — M545 Low back pain, unspecified: Secondary | ICD-10-CM | POA: Diagnosis not present

## 2020-05-09 DIAGNOSIS — R252 Cramp and spasm: Secondary | ICD-10-CM | POA: Diagnosis not present

## 2020-05-09 DIAGNOSIS — G8929 Other chronic pain: Secondary | ICD-10-CM | POA: Diagnosis not present

## 2020-05-09 NOTE — Therapy (Signed)
[facility] [address] [place], [place], [address] Phone: [phone]   Fax:  [phone]  Physical Therapy Treatment  Patient Details  Name: Bridget [name] MRN: [id number] Date of Birth: 1945/02/24 Referring Provider (PT): Hulan Saas, MD   Encounter Date: 05/09/2020   PT End of Session - 05/09/20 1151    Visit Number 8    Date for PT Re-Evaluation 05/15/20   completed 05/09/2020   Authorization Type [employer] Medicare    Progress Note Due on Visit 18    PT Start Time 1102    PT Stop Time 1145    PT Time Calculation (min) 43 min    Activity Tolerance Patient tolerated treatment well;No increased pain    Behavior During Therapy WFL for tasks assessed/performed           Past Medical History:  Diagnosis Date  . Hypertension   . Seasonal allergies     Past Surgical History:  Procedure Laterality Date  . BREAST EXCISIONAL BIOPSY Left   . CHOLECYSTECTOMY  02/2010  . KNEE SURGERY    . OOPHORECTOMY  01/2003   RSO AT [facility]  . VAGINAL HYSTERECTOMY  01/2003   [facility] A&P REPAIR    There were no vitals filed for this visit.   Subjective Assessment - 05/09/20 1104    Subjective Helped move a table at church yesterday with no increased pain. Is going to begin exercise program at [facility] center.    Pertinent History a-fit, Lt RTC tear, DJD Lt knee-plans to have knee replacement soon    Limitations Standing;Walking    How long can you stand comfortably? 1-2 hours with after    How long can you walk comfortably? 1-2 hours with pain after    Diagnostic tests known lumbar DDD    Patient Stated Goals increased ease with housework, reduce LBP    Currently in Pain? Yes    Pain Score 2     Pain Location Back              [facility] PT Assessment - 05/09/20 0001      Assessment   Medical Diagnosis chronic low back pain    Referring Provider (PT) Hulan Saas, MD    Onset Date/Surgical Date 03/09/19    Next MD  Visit 05/11/2020      Precautions   Precautions None      Restrictions   Weight Bearing Restrictions No      Balance Screen   Has the patient fallen in the past 6 months No    Has the patient had a decrease in activity level because of a fear of falling?  No    Is the patient reluctant to leave their home because of a fear of falling?  No      Home Environment   Living Environment Private residence    Living Arrangements Spouse/significant other    Type of [place] to enter    Home Layout One level      Prior Function   Level of [place] Retired    Leisure Pension scheme manager   Overall Cognitive Status Within Functional Limits for tasks assessed      Observation/Other Assessments   Focus on Therapeutic Outcomes (FOTO)  57      Posture/Postural Control   Posture/Postural Control Postural limitations    Postural Limitations Forward head;Flexed trunk  AROM   Overall AROM  Within functional limits for tasks performed      Transfers   Transfers Sit to Stand;Stand to Sit    Sit to Stand From bed;Uncontrolled descent                         [facility] Adult PT Treatment/Exercise - 05/09/20 0001      Lumbar Exercises: Stretches   Active Hamstring Stretch Right;Left;2 reps;20 seconds    Active Hamstring Stretch Limitations seated      Lumbar Exercises: Aerobic   Nustep Level 2 x 4 minutes at end of session      Lumbar Exercises: Standing   Heel Raises Limitations 2 x 10 repetitions; hand hold x2    Row Both;Power tower;10 reps    Row Limitations 25 lbs    Other Standing Lumbar Exercises lat pull down red tband; x12 repetitions      Lumbar Exercises: Seated   Sit to Stand 10 reps    Sit to Stand Limitations verbal cues for controlled descent      Lumbar Exercises: Supine   Ab Set 15 reps;3 seconds    AB Set Limitations with ball squeeze    Clam 15 reps    Clam Limitations red loop    Bridge --     Bridge Limitations 2x10 repetitions; visual demonstration provided                    PT Short Term Goals - 05/09/20 1136      PT SHORT TERM GOAL #1   Title be independent in initial HEP    Time 4    Period Weeks    Status Achieved    Target Date 04/17/20      PT SHORT TERM GOAL #2   Title report < or = to 4/10 LBP with housework and yardwork    Baseline < 4/10 with vacuuming and mopping this weekend    Time 4    Period Weeks    Status Achieved   2/10 pain this date   Target Date 04/17/20      PT SHORT TERM GOAL #3   Title report a 30% reduction in the frequency and intensity of LBP with daily tasks.    Baseline 85%    Time 4    Period Weeks    Status Achieved             PT Long Term Goals - 05/09/20 1137      PT LONG TERM GOAL #1   Title be independent in advanced HEP    Time 8    Period Weeks    Status On-going      PT LONG TERM GOAL #2   Title report < or = to 2-3/10 LBP with yardwork and housework    Time 8    Period Weeks    Status Achieved   assisted with moving table at church with no increased pain     PT LONG TERM GOAL #3   Title improve FOTO to > or = to 63    Baseline 52    Time 8    Period Weeks    Status On-going   57     PT LONG TERM GOAL #4   Title verbalize and demonstrate body mechanics modification for lumbar protection with housework and yard work    Time 8    Period Weeks    Status Achieved  PT LONG TERM GOAL #5   Title report a 70% reduction in the frequency and intensity of LBP with daily tasks.    Baseline 90%    Time 8    Period Weeks    Status Achieved                 Plan - 05/09/20 1130    Clinical Impression Statement Patient is a 75 y/o female referred due to chronic low back pain. Patient continues to report approx. 90% improvement. Has low level pain today but attributes this to undergoing her exercise evaluation at [facility] center yesterday. Noting continues glute strength impairments as  patient unable to fully clear table when performing bridge exercise. However, patient demonstrating improved table clearance with visual demonstration and destruction for pre-glute contraction prior to movement. Patient reports improved tolerance of ADLs as she has been able to complete some housework and yardwork without increased pain. FOTO score improved from 52 to 57 thought not yet at goal. Would benefit from continued skilled intervention at reduced frequency to address impairments for improved functional activity tolerance and to more readily complete ADLs.    Personal Factors and Comorbidities Age;Comorbidity 2    Comorbidities Lt knee pain, HTN, a-fib    Examination-Activity Limitations Locomotion Level;Squat;Stand    Examination-Participation Restrictions Community Activity;Shop    Rehab Potential Excellent    PT Frequency 1x / week    PT Duration 4 weeks    PT Treatment/Interventions ADLs/Self Care Home Management;Cryotherapy;Electrical Stimulation;Moist Heat;Functional mobility training;Gait training;Therapeutic activities;Therapeutic exercise;Neuromuscular re-education;Patient/family education;Manual techniques;Taping;Passive range of motion;Dry needling;Joint Manipulations    PT Next Visit Plan continue functional strenthening and lumbar mobility    PT Home Exercise Plan Access Code: [email or web address]    Consulted and Agree with Plan of Care Patient           Patient will benefit from skilled therapeutic intervention in order to improve the following deficits and impairments:  Abnormal gait,Decreased activity tolerance,Decreased strength,Pain,Postural dysfunction,Improper body mechanics,Decreased range of motion,Increased muscle spasms,Decreased endurance  Visit Diagnosis: Chronic bilateral low back pain, unspecified whether sciatica present - Plan: PT plan of care cert/re-cert  Cramp and spasm - Plan: PT plan of care cert/re-cert     Problem List Patient Active Problem List    Diagnosis Date Noted  . Degenerative arthritis of left knee 11/24/2019  . Secondary hypercoagulable state ([place]) 01/26/2019  . Greater trochanteric bursitis of right hip 09/23/2018  . Lumbar radiculopathy 08/12/2018  . Gluteal tendonitis of right buttock 07/29/2018  . Atrial fibrillation ([place]) 07/21/2018  . Arthritis of left acromioclavicular joint 04/26/2018  . Partial nontraumatic tear of left rotator cuff 04/26/2018  . Hypertension   . Seasonal allergies     Everardo All PT, DPT  05/09/20 11:53 AM   [facility] [address], [place], [place], [address] Phone: [phone]   Fax:  [phone]  Name: Bridget [name] MRN: [id number] Date of Birth: [birth year]

## 2020-05-11 ENCOUNTER — Ambulatory Visit (INDEPENDENT_AMBULATORY_CARE_PROVIDER_SITE_OTHER): Payer: Medicare Other | Admitting: Family Medicine

## 2020-05-11 ENCOUNTER — Other Ambulatory Visit: Payer: Self-pay

## 2020-05-11 ENCOUNTER — Encounter: Payer: Self-pay | Admitting: Family Medicine

## 2020-05-11 DIAGNOSIS — M5416 Radiculopathy, lumbar region: Secondary | ICD-10-CM

## 2020-05-11 NOTE — Assessment & Plan Note (Signed)
Patient is doing much better at this time.  Discussed icing regimen and home exercises, discussed which activities to doing which wants to avoid.  Increase activity slowly.  Patient knows to call if she feels like she needs another potential injection.  At this point they will follow-up with me as needed

## 2020-05-11 NOTE — Progress Notes (Signed)
[place] Phone: [phone] Subjective:   Bridget [name], am serving as a scribe for Dr. Hulan Saas. This visit occurred during the SARS-CoV-2 public health emergency.  Safety protocols were in place, including screening questions prior to the visit, additional usage of staff PPE, and extensive cleaning of exam room while observing appropriate contact time as indicated for disinfecting solutions.   I'm seeing this patient by the request  of:  Jonathon Jordan, MD  CC: Low back pain follow-up  WGN:[id number]   03/14/2020 Known significant arthritic changes with likely nerve root impingement.  Has responded to 2 epidurals previously and the last one was in May 2021.  Patient wants to hold at this time but would like to start with formal physical therapy which I think will be beneficial.  Patient will start in the near future hopefully.  Patient will follow up with me again in 2 to 3 months  Update 05/11/2020 Bridget [name] is a 75 y.o. female coming in with complaint of back pain. Epidural on 04/12/2020. Today her pain is 90% better. [name] aching in leg. Does feel that physical therapy is helping in addition.  Patient has been doing her own exercises as well.  Feels like she has made significant improvement overall and is happy.    Past Medical History:  Diagnosis Date  . Hypertension   . Seasonal allergies    Past Surgical History:  Procedure Laterality Date  . BREAST EXCISIONAL BIOPSY Left   . CHOLECYSTECTOMY  02/2010  . KNEE SURGERY    . OOPHORECTOMY  01/2003   RSO AT [facility]  . VAGINAL HYSTERECTOMY  01/2003   [facility] A&P REPAIR   Social History   Socioeconomic History  . Marital status: Married    Spouse name: Not on file  . Number of children: Not on file  . Years of education: Not on file  . Highest education level: Not on file  Occupational History  . Not on file  Tobacco Use  . Smoking status: Never  Smoker  . Smokeless tobacco: Never Used  Substance and Sexual Activity  . Alcohol use: Yes    Comment: SELDOM  . Drug use: [name]  . Sexual activity: Not on file    Comment: HYST  Other Topics Concern  . Not on file  Social History Narrative  . Not on file   Social Determinants of Health   Financial Resource Strain: Not on file  Food Insecurity: Not on file  Transportation Needs: Not on file  Physical Activity: Not on file  Stress: Not on file  Social Connections: Not on file   Allergies  Allergen Reactions  . Codeine Nausea Only   Family History  Problem Relation Age of Onset  . Heart disease Mother   . Diabetes Father        AODM  . Hypertension Father   . Cancer Father        LUNG  . Cancer Brother 83       BLADDER  . Breast cancer Sister 31     Current Outpatient Medications (Cardiovascular):  .  benazepril-hydrochlorthiazide (LOTENSIN HCT) 20-25 MG per tablet, Take 1 tablet by mouth daily. Marland Kitchen  DILT-XR 180 MG 24 hr capsule, Take 180 mg by mouth daily. .  pravastatin (PRAVACHOL) 20 MG tablet, Take 20 mg by mouth at bedtime. .  furosemide (LASIX) 20 MG tablet, Take 1 tablet (20 mg total) by mouth as  needed.    Current Outpatient Medications (Hematological):  Marland Kitchen  ELIQUIS 5 MG TABS tablet, TAKE 1 TABLET BY MOUTH TWICE A DAY  Current Outpatient Medications (Other):  .  calcium carbonate (OSCAL) 1500 (600 Ca) MG TABS tablet, Take by mouth daily with breakfast.  .  cholecalciferol (VITAMIN D3) 25 MCG (1000 UNIT) tablet, Take 1,000 Units by mouth daily. .  Multiple Vitamin (MULTIVITAMIN WITH MINERALS) TABS tablet, Take 1 tablet by mouth daily. .  potassium chloride SA (KLOR-CON M20) 20 MEQ tablet, Take 1 tablet (20 mEq total) by mouth daily. Marland Kitchen  FAMOTIDINE PO, Take 2 tablets by mouth. 1 tab in the morning 1 tab in the afternoon   Reviewed prior external information including notes and imaging from  primary care provider As well as notes that were available from care  everywhere and other healthcare systems.  Past medical history, social, surgical and family history all reviewed in electronic medical record.  [name] unless stated regarding to the chief complaint.   Review of Systems:  [name] headache, visual changes, nausea, vomiting, diarrhea, constipation, dizziness, abdominal pain, skin rash, fevers, chills, night sweats, weight loss, swollen lymph nodes, body aches, joint swelling, chest pain, shortness of breath, mood changes.   Objective  Blood pressure 118/72, pulse 69, height 5\' 5"  (1.651 m), weight 233 lb (105.7 kg), last menstrual period 01/07/2003, SpO2 96 %.   General: [name] apparent distress alert and oriented x3 mood and affect normal, dressed appropriately.  HEENT: Pupils equal, extraocular movements intact  Respiratory: Patient's speak in full sentences and does not appear short of breath  Cardiovascular: [name] lower extremity edema, non tender, [name] erythema  Gait antalgic medical monitoring still some loss of lordosis with some mild degenerative scoliosis.  Negative straight leg test.  Very minimal tenderness noted in the paraspinal musculature of the left side.  [name] radicular symptoms with any of the exam today.  Neurovascularly intact distally     Impression and Recommendations:     The above documentation has been reviewed and is accurate and complete Lyndal Pulley, DO

## 2020-05-11 NOTE — Patient Instructions (Signed)
Happy you are doing better Always a please See me when you need me

## 2020-05-15 ENCOUNTER — Other Ambulatory Visit: Payer: Self-pay

## 2020-05-15 ENCOUNTER — Ambulatory Visit: Payer: Medicare Other

## 2020-05-15 DIAGNOSIS — G8929 Other chronic pain: Secondary | ICD-10-CM

## 2020-05-15 DIAGNOSIS — R252 Cramp and spasm: Secondary | ICD-10-CM

## 2020-05-15 DIAGNOSIS — M545 Low back pain, unspecified: Secondary | ICD-10-CM | POA: Diagnosis not present

## 2020-05-15 NOTE — Therapy (Signed)
[facility] [address], [place], [place], [address] Phone: [phone]   Fax:  [phone]  Physical Therapy Treatment  Patient Details  Name: Bridget [name] MRN: [id number] Date of Birth: 11/10/1945 Referring Provider (PT): Hulan Saas, MD   Encounter Date: 05/15/2020   PT End of Session - 05/15/20 1140    Visit Number 9    Date for PT Re-Evaluation 05/15/20    Authorization Type A[employer]na Medicare    PT Start Time 1103    PT Stop Time 1139    PT Time Calculation (min) 36 min    Activity Tolerance Patient tolerated treatment well;No increased pain    Behavior During Therapy WFL for tasks assessed/performed           Past Medical History:  Diagnosis Date  . Hypertension   . Seasonal allergies     Past Surgical History:  Procedure Laterality Date  . BREAST EXCISIONAL BIOPSY Left   . CHOLECYSTECTOMY  02/2010  . KNEE SURGERY    . OOPHORECTOMY  01/2003   RSO AT [facility]  . VAGINAL HYSTERECTOMY  01/2003   [facility]O A&P REPAIR    There were no vitals filed for this visit.   Subjective Assessment - 05/15/20 1105    Subjective I walked 25 minutes yesterday and I had some pain on the Rt side of my back but that worked itself out.  I also did some gardening without.    Pertinent History a-fit, Lt RTC tear, DJD Lt knee-plans to have knee replacement soon    Patient Stated Goals increased ease with housework, reduce LBP    Currently in Pain? No/denies                             [facility] Adult PT Treatment/Exercise - 05/15/20 0001      Lumbar Exercises: Stretches   Active Hamstring Stretch Right;Left;2 reps;20 seconds    Active Hamstring Stretch Limitations seated    Lower Trunk Rotation 5 reps;10 seconds      Lumbar Exercises: Aerobic   Nustep Level 2 x 8 minutes- PT present to discuss progress with pt      Lumbar Exercises: Standing   Other Standing Lumbar Exercises ball diagonals with 2#  medicine ball- 2x10 bil each      Lumbar Exercises: Seated   Sit to Stand 20 reps    Sit to Stand Limitations verbal cues for controlled descent    Other Seated Lumbar Exercises deadlift to stool with 5# kettlebell      Lumbar Exercises: Supine   Ab Set 15 reps;3 seconds    AB Set Limitations with ball squeeze    Clam 15 reps    Clam Limitations red loop                    PT Short Term Goals - 05/09/20 1136      PT SHORT TERM GOAL #1   Title be independent in initial HEP    Time 4    Period Weeks    Status Achieved    Target Date 04/17/20      PT SHORT TERM GOAL #2   Title report < or = to 4/10 LBP with housework and yardwork    Baseline < 4/10 with vacuuming and mopping this weekend    Time 4    Period Weeks    Status Achieved   2/10 pain this date  Target Date 04/17/20      PT SHORT TERM GOAL #3   Title report a 30% reduction in the frequency and intensity of LBP with daily tasks.    Baseline 85%    Time 4    Period Weeks    Status Achieved             PT Long Term Goals - 05/09/20 1137      PT LONG TERM GOAL #1   Title be independent in advanced HEP    Time 8    Period Weeks    Status On-going      PT LONG TERM GOAL #2   Title report < or = to 2-3/10 LBP with yardwork and housework    Time 8    Period Weeks    Status Achieved   assisted with moving table at church with no increased pain     PT LONG TERM GOAL #3   Title improve FOTO to > or = to 63    Baseline 52    Time 8    Period Weeks    Status On-going   57     PT LONG TERM GOAL #4   Title verbalize and demonstrate body mechanics modification for lumbar protection with housework and yard work    Time 8    Period Weeks    Status Achieved      PT LONG TERM GOAL #5   Title report a 70% reduction in the frequency and intensity of LBP with daily tasks.    Baseline 90%    Time 8    Period Weeks    Status Achieved                 Plan - 05/15/20 1118    Clinical  Impression Statement Pt was able to walk 25 minutes and do some gardening in the past few days without limitation.  Pt with some soreness on the Rt side of the back after but this was brief.  FOTO score improved last week from 52 to 57 thought not yet at goal. Pt has initiated program at [facility] and plans to attend a class on Thursday.  PT provided verbal cues throughout session to provide cueing for technique and to monitor for fatigue.  Pt benefit from continued skilled intervention 1x/wk or less to address impairments for improved functional activity tolerance and to more readily    PT Frequency 1x / week    PT Duration 4 weeks    PT Treatment/Interventions ADLs/Self Care Home Management;Cryotherapy;Electrical Stimulation;Moist Heat;Functional mobility training;Gait training;Therapeutic activities;Therapeutic exercise;Neuromuscular re-education;Patient/family education;Manual techniques;Taping;Passive range of motion;Dry needling;Joint Manipulations    PT Next Visit Plan continue functional strenthening and lumbar mobility    PT Home Exercise Plan Access Code: [email or web address]    Consulted and Agree with Plan of Care Patient           Patient will benefit from skilled therapeutic intervention in order to improve the following deficits and impairments:  Abnormal gait,Decreased activity tolerance,Decreased strength,Pain,Postural dysfunction,Improper body mechanics,Decreased range of motion,Increased muscle spasms,Decreased endurance  Visit Diagnosis: Chronic bilateral low back pain, unspecified whether sciatica present  Cramp and spasm     Problem List Patient Active Problem List   Diagnosis Date Noted  . Degenerative arthritis of left knee 11/24/2019  . Secondary hypercoagulable state ([place]) 01/26/2019  . Greater trochanteric bursitis of right hip 09/23/2018  . Lumbar radiculopathy 08/12/2018  . Gluteal tendonitis of right buttock 07/29/2018  . Atrial fibrillation (  [place]) 07/21/2018  .  Arthritis of left acromioclavicular joint 04/26/2018  . Partial nontraumatic tear of left rotator cuff 04/26/2018  . Hypertension   . Seasonal allergies      Sigurd Sos, PT 05/15/20 11:45 AM  [facility] [address], [place], [place], [address] Phone: [phone]   Fax:  [phone]  Name: Bridget [name] MRN: [id number] Date of Birth: 28-Apr-1945

## 2020-05-22 ENCOUNTER — Other Ambulatory Visit: Payer: Self-pay

## 2020-05-22 ENCOUNTER — Ambulatory Visit: Payer: Medicare Other

## 2020-05-22 DIAGNOSIS — R252 Cramp and spasm: Secondary | ICD-10-CM

## 2020-05-22 DIAGNOSIS — H2513 Age-related nuclear cataract, bilateral: Secondary | ICD-10-CM | POA: Diagnosis not present

## 2020-05-22 DIAGNOSIS — H35373 Puckering of macula, bilateral: Secondary | ICD-10-CM | POA: Diagnosis not present

## 2020-05-22 DIAGNOSIS — H524 Presbyopia: Secondary | ICD-10-CM | POA: Diagnosis not present

## 2020-05-22 DIAGNOSIS — M545 Low back pain, unspecified: Secondary | ICD-10-CM | POA: Diagnosis not present

## 2020-05-22 DIAGNOSIS — G8929 Other chronic pain: Secondary | ICD-10-CM

## 2020-05-22 DIAGNOSIS — H52223 Regular astigmatism, bilateral: Secondary | ICD-10-CM | POA: Diagnosis not present

## 2020-05-22 DIAGNOSIS — H5203 Hypermetropia, bilateral: Secondary | ICD-10-CM | POA: Diagnosis not present

## 2020-05-22 NOTE — Therapy (Signed)
[facility] [address] [place], [place], [address] Phone: [phone]   Fax:  [phone]  Physical Therapy Treatment  Patient Details  Name: Bridget [name] MRN: [id number] Date of Birth: [birth year] Referring Provider (PT): Hulan Saas, MD   Encounter Date: 05/22/2020  Progress Note Reporting Period 03/20/20 to 05/22/20  See note below for Objective Data and Assessment of Progress/Goals.        PT End of Session - 05/22/20 1138    Visit Number 10    Date for PT Re-Evaluation 06/12/20    Authorization Type [employer] Medicare    Progress Note Due on Visit 20    PT Start Time 1100    PT Stop Time 1139    PT Time Calculation (min) 39 min           Past Medical History:  Diagnosis Date  . Hypertension   . Seasonal allergies     Past Surgical History:  Procedure Laterality Date  . BREAST EXCISIONAL BIOPSY Left   . CHOLECYSTECTOMY  02/2010  . KNEE SURGERY    . OOPHORECTOMY  01/2003   RSO AT [facility]  . VAGINAL HYSTERECTOMY  01/2003   [facility] A&P REPAIR    There were no vitals filed for this visit.       [facility] PT Assessment - 05/22/20 0001      Assessment   Medical Diagnosis chronic low back pain    Referring Provider (PT) Hulan Saas, MD    Onset Date/Surgical Date 03/09/19      Prior Function   Level of Independence Independent      Observation/Other Assessments   Focus on Therapeutic Outcomes (FOTO)  26                         [facility] Adult PT Treatment/Exercise - 05/22/20 0001      Lumbar Exercises: Stretches   Active Hamstring Stretch Right;Left;2 reps;20 seconds    Active Hamstring Stretch Limitations seated    Lower Trunk Rotation 5 reps;10 seconds    Piriformis Stretch Left;Right;2 reps;20 seconds      Lumbar Exercises: Aerobic   Nustep Level 2 x 8 minutes- PT present to discuss progress with pt      Lumbar Exercises: Standing   Other Standing Lumbar Exercises  ball diagonals with 2# medicine ball- 2x10 bil each      Lumbar Exercises: Seated   Sit to Stand 20 reps    Sit to Stand Limitations verbal cues for controlled descent.  Holding 5# kettle bell      Lumbar Exercises: Supine   Ab Set 15 reps;3 seconds    AB Set Limitations with ball squeeze    Clam 15 reps    Clam Limitations red loop                  PT Education - 05/22/20 1137    Education Details verbal review of HEP and body mechanics for yard work and home tasks    [employer]) Educated Patient    Methods Explanation;Handout    Comprehension Verbalized understanding;Returned demonstration            PT Short Term Goals - 05/09/20 1136      PT SHORT TERM GOAL #1   Title be independent in initial HEP    Time 4    Period Weeks    Status Achieved    Target Date 04/17/20  PT SHORT TERM GOAL #2   Title report < or = to 4/10 LBP with housework and yardwork    Baseline < 4/10 with vacuuming and mopping this weekend    Time 4    Period Weeks    Status Achieved   2/10 pain this date   Target Date 04/17/20      PT SHORT TERM GOAL #3   Title report a 30% reduction in the frequency and intensity of LBP with daily tasks.    Baseline 85%    Time 4    Period Weeks    Status Achieved             PT Long Term Goals - 05/22/20 1105      PT LONG TERM GOAL #1   Title be independent in advanced HEP    Status On-going      PT LONG TERM GOAL #2   Title report < or = to 2-3/10 LBP with yardwork and housework    Status Achieved      PT LONG TERM GOAL #3   Title improve FOTO to > or = to 63    Baseline 52    Status On-going      PT LONG TERM GOAL #4   Title verbalize and demonstrate body mechanics modification for lumbar protection with housework and yard work    Status Achieved      PT LONG TERM GOAL #5   Title report a 70% reduction in the frequency and intensity of LBP with daily tasks.    Baseline 90%    Status Achieved                 Plan  - 05/22/20 1116    Clinical Impression Statement Pt was able to walk 25 minutes and do some gardening in the past week without limitation.  Pt with some soreness on the Rt side of the back after but this was brief.  FOTO score improved last week from 52 to 57 thought not yet at goal. Pt has initiated program at [facility] and plans to attend a class this week.  PT provided verbal cues throughout session to provide cueing for technique and to monitor for fatigue.  Pt benefit from continued skilled intervention 1x/wk or less to address impairments for improved functional activity tolerance and to more readily.    PT Frequency 1x / week    PT Duration 4 weeks    PT Treatment/Interventions ADLs/Self Care Home Management;Cryotherapy;Electrical Stimulation;Moist Heat;Functional mobility training;Gait training;Therapeutic activities;Therapeutic exercise;Neuromuscular re-education;Patient/family education;Manual techniques;Taping;Passive range of motion;Dry needling;Joint Manipulations    PT Next Visit Plan continue functional strenthening and lumbar mobility. 1-2 more sessions.  Pt will skip next week and return if needed.    PT Home Exercise Plan Access Code: [email or web address]    Consulted and Agree with Plan of Care Patient           Patient will benefit from skilled therapeutic intervention in order to improve the following deficits and impairments:  Abnormal gait,Decreased activity tolerance,Decreased strength,Pain,Postural dysfunction,Improper body mechanics,Decreased range of motion,Increased muscle spasms,Decreased endurance  Visit Diagnosis: Cramp and spasm  Chronic bilateral low back pain, unspecified whether sciatica present     Problem List Patient Active Problem List   Diagnosis Date Noted  . Degenerative arthritis of left knee 11/24/2019  . Secondary hypercoagulable state ([place]) 01/26/2019  . Greater trochanteric bursitis of right hip 09/23/2018  . Lumbar radiculopathy 08/12/2018  . Gluteal  tendonitis of right buttock 07/29/2018  .  Atrial fibrillation ([place]) 07/21/2018  . Arthritis of left acromioclavicular joint 04/26/2018  . Partial nontraumatic tear of left rotator cuff 04/26/2018  . Hypertension   . Seasonal allergies      Sigurd Sos, PT 05/22/20 11:45 AM  [facility] [address], [place], [place], [address] Phone: [phone]   Fax:  [phone]  Name: LATAJAH THUMAN MRN: [id number] Date of Birth: [birth year]

## 2020-05-24 ENCOUNTER — Other Ambulatory Visit: Payer: Self-pay | Admitting: Adult Health

## 2020-05-29 ENCOUNTER — Encounter: Payer: Medicare Other | Admitting: Physical Therapy

## 2020-05-31 ENCOUNTER — Encounter: Payer: Medicare Other | Admitting: Physical Therapy

## 2020-06-05 ENCOUNTER — Other Ambulatory Visit: Payer: Self-pay

## 2020-06-05 ENCOUNTER — Ambulatory Visit: Payer: Medicare Other

## 2020-06-05 DIAGNOSIS — G8929 Other chronic pain: Secondary | ICD-10-CM | POA: Diagnosis not present

## 2020-06-05 DIAGNOSIS — M545 Low back pain, unspecified: Secondary | ICD-10-CM | POA: Diagnosis not present

## 2020-06-05 DIAGNOSIS — R252 Cramp and spasm: Secondary | ICD-10-CM

## 2020-06-05 NOTE — Therapy (Signed)
[facility] [address], [place], [place], [address] Phone: [phone]   Fax:  [phone]  Physical Therapy Treatment  Patient Details  Name: RANDEE HUSTON MRN: [id number] Date of Birth: 08/21/1945 Referring Provider (PT): Hulan Saas, MD   Encounter Date: 06/05/2020   PT End of Session - 06/05/20 1223    Visit Number 11    Authorization Type A[employer]na Medicare    Progress Note Due on Visit 20    PT Start Time 1146    PT Stop Time 1220    PT Time Calculation (min) 34 min    Activity Tolerance Patient tolerated treatment well;No increased pain    Behavior During Therapy WFL for tasks assessed/performed           Past Medical History:  Diagnosis Date  . Hypertension   . Seasonal allergies     Past Surgical History:  Procedure Laterality Date  . BREAST EXCISIONAL BIOPSY Left   . CHOLECYSTECTOMY  02/2010  . KNEE SURGERY    . OOPHORECTOMY  01/2003   RSO AT [facility]  . VAGINAL HYSTERECTOMY  01/2003   [facility] A&P REPAIR    There were no vitals filed for this visit.   Subjective Assessment - 06/05/20 1146    Subjective I've been feeling good and I'm ready for D/C    Currently in Pain? No/denies              [facility] PT Assessment - 06/05/20 0001      Assessment   Medical Diagnosis chronic low back pain    Referring Provider (PT) Hulan Saas, MD    Onset Date/Surgical Date 03/09/19      Prior Function   Level of Independence Independent      Cognition   Overall Cognitive Status Within Functional Limits for tasks assessed      Observation/Other Assessments   Focus on Therapeutic Outcomes (FOTO)  78                         [facility] Adult PT Treatment/Exercise - 06/05/20 0001      Lumbar Exercises: Stretches   Active Hamstring Stretch Right;Left;2 reps;20 seconds    Active Hamstring Stretch Limitations seated    Lower Trunk Rotation 5 reps;10 seconds      Lumbar Exercises:  Aerobic   Nustep Level 2 x 8 minutes- PT present to discuss progress with pt      Lumbar Exercises: Seated   Sit to Stand 20 reps    Sit to Stand Limitations verbal cues for controlled descent.  Holding 5# kettle bell      Lumbar Exercises: Supine   Ab Set 15 reps;3 seconds    Clam 15 reps    Clam Limitations red loop                    PT Short Term Goals - 05/09/20 1136      PT SHORT TERM GOAL #1   Title be independent in initial HEP    Time 4    Period Weeks    Status Achieved    Target Date 04/17/20      PT SHORT TERM GOAL #2   Title report < or = to 4/10 LBP with housework and yardwork    Baseline < 4/10 with vacuuming and mopping this weekend    Time 4    Period Weeks    Status Achieved   2/10  pain this date   Target Date 04/17/20      PT SHORT TERM GOAL #3   Title report a 30% reduction in the frequency and intensity of LBP with daily tasks.    Baseline 85%    Time 4    Period Weeks    Status Achieved             PT Long Term Goals - 06/05/20 1147      PT LONG TERM GOAL #1   Title be independent in advanced HEP    Status Achieved      PT LONG TERM GOAL #2   Title report < or = to 2-3/10 LBP with yardwork and housework    Status Achieved      PT LONG TERM GOAL #3   Title improve FOTO to > or = to 63    Baseline 78    Status Achieved      PT LONG TERM GOAL #4   Title verbalize and demonstrate body mechanics modification for lumbar protection with housework and yard work    Status Achieved      PT LONG TERM GOAL #5   Title report a 70% reduction in the frequency and intensity of LBP with daily tasks.    Status Achieved                 Plan - 06/05/20 1223    Clinical Impression Statement Pt reports 90% overall improvement in symptoms since the start of care.  Pt has not had lumbar pain over the past 2 weeks and has resumed her usual tasks without limitation.  FOTO has exceeded goal and is 78 today.  Pt denies any functional  limitations at this time.   PT spent session reviewing HEP and pt educated pt regarding importance of compliance and consistency with HEP.  Pt is also exercising at [facility] regularly.  Pt will follow-up with MD as needed.    PT Next Visit Plan D/C PT to HEP    PT Home Exercise Plan Access Code: [email or web address]    Consulted and Agree with Plan of Care Patient           Patient will benefit from skilled therapeutic intervention in order to improve the following deficits and impairments:     Visit Diagnosis: Chronic bilateral low back pain, unspecified whether sciatica present  Cramp and spasm     Problem List Patient Active Problem List   Diagnosis Date Noted  . Degenerative arthritis of left knee 11/24/2019  . Secondary hypercoagulable state ([place]) 01/26/2019  . Greater trochanteric bursitis of right hip 09/23/2018  . Lumbar radiculopathy 08/12/2018  . Gluteal tendonitis of right buttock 07/29/2018  . Atrial fibrillation ([place]) 07/21/2018  . Arthritis of left acromioclavicular joint 04/26/2018  . Partial nontraumatic tear of left rotator cuff 04/26/2018  . Hypertension   . Seasonal allergies     PHYSICAL THERAPY DISCHARGE SUMMARY  Visits from Start of Care: 11  Current functional level related to goals / functional outcomes: See above for current status.     Remaining deficits: No functional deficits remain.     Education / Equipment: HEP Plan: Patient agrees to discharge.  Patient goals were met. Patient is being discharged due to meeting the stated rehab goals.  ?????        Sigurd Sos, PT 06/05/20 12:28 PM  [facility] [address], [place], [place], [address] Phone: [phone]   Fax:  [phone]  Name: DESTANI WAMSER MRN: [id number] Date of Birth: Jul 14, 1945

## 2020-06-06 ENCOUNTER — Telehealth: Payer: Self-pay | Admitting: Cardiology

## 2020-06-06 MED ORDER — DILTIAZEM HCL ER COATED BEADS 240 MG PO CP24: 240.0000 mg | ORAL_CAPSULE | Freq: Every day | ORAL | 1 refills | Status: DC

## 2020-06-06 NOTE — Telephone Encounter (Signed)
Spoke with patient and she reported palpitations this morning with elevated blood pressure and heart rate.  No chest pain or shortness of breath.  Patient took her medications and an extra K+ tablet and states things feel better and reports b/p is 141/86 and heart rate 77.  Advised I would forward this information to Dr. Radford Pax and Dala Dock RN.  Patient appreciative of call.

## 2020-06-06 NOTE — Telephone Encounter (Signed)
Patient c/o Palpitations:  High priority if patient c/o lightheadedness, shortness of breath, or chest pain  1) How long have you had palpitations/irregular HR/ Afib? Are you having the symptoms now?  This morning from 6:30am to 10:00am, no symptoms now  2) Are you currently experiencing lightheadedness, SOB or CP?  No  3) Do you have a history of afib (atrial fibrillation) or irregular heart rhythm?  Pt had one  episode in 05/2018   4) Have you checked your BP or HR? (document readings if available):  162/101HR 110 this morning at 6:27am              148/81HR 66- currently  5) Are you experiencing any other symptoms? NO

## 2020-06-06 NOTE — Addendum Note (Signed)
Addended by: Willeen Cass on: 06/06/2020 12:29 PM   Modules accepted: Orders

## 2020-06-06 NOTE — Telephone Encounter (Signed)
Spoke with patient about increase in cardizem 240 daily and to record heart rate and blood pressure daily.  Patient is to call after one week and report readings.  Advised to continue all medications as ordered but increase the caridizem from 180 to 240mg .  Patient appreciative of return call.

## 2020-06-07 ENCOUNTER — Encounter: Payer: Medicare Other | Admitting: Physical Therapy

## 2020-06-11 DIAGNOSIS — K219 Gastro-esophageal reflux disease without esophagitis: Secondary | ICD-10-CM | POA: Diagnosis not present

## 2020-06-11 DIAGNOSIS — R7303 Prediabetes: Secondary | ICD-10-CM | POA: Diagnosis not present

## 2020-06-12 ENCOUNTER — Ambulatory Visit: Payer: Medicare Other | Admitting: Physical Therapy

## 2020-06-14 ENCOUNTER — Ambulatory Visit: Payer: Medicare Other | Admitting: Family Medicine

## 2020-06-28 ENCOUNTER — Other Ambulatory Visit: Payer: Self-pay | Admitting: Cardiology

## 2020-07-20 DIAGNOSIS — Z23 Encounter for immunization: Secondary | ICD-10-CM | POA: Diagnosis not present

## 2020-08-23 ENCOUNTER — Emergency Department (HOSPITAL_BASED_OUTPATIENT_CLINIC_OR_DEPARTMENT_OTHER)
Admission: EM | Admit: 2020-08-23 | Discharge: 2020-08-24 | Disposition: A | Discharge status: Home / Self Care | Payer: Medicare Other | Attending: Emergency Medicine | Admitting: Emergency Medicine

## 2020-08-23 ENCOUNTER — Other Ambulatory Visit: Payer: Self-pay

## 2020-08-23 ENCOUNTER — Encounter (HOSPITAL_BASED_OUTPATIENT_CLINIC_OR_DEPARTMENT_OTHER): Payer: Self-pay

## 2020-08-23 DIAGNOSIS — R42 Dizziness and giddiness: Secondary | ICD-10-CM | POA: Diagnosis not present

## 2020-08-23 DIAGNOSIS — Z7901 Long term (current) use of anticoagulants: Secondary | ICD-10-CM | POA: Insufficient documentation

## 2020-08-23 DIAGNOSIS — N3001 Acute cystitis with hematuria: Secondary | ICD-10-CM

## 2020-08-23 DIAGNOSIS — Z79899 Other long term (current) drug therapy: Secondary | ICD-10-CM | POA: Diagnosis not present

## 2020-08-23 DIAGNOSIS — R600 Localized edema: Secondary | ICD-10-CM | POA: Diagnosis not present

## 2020-08-23 DIAGNOSIS — I10 Essential (primary) hypertension: Secondary | ICD-10-CM | POA: Insufficient documentation

## 2020-08-23 LAB — URINALYSIS, ROUTINE W REFLEX MICROSCOPIC
Bilirubin Urine: NEGATIVE
Glucose, UA: NEGATIVE mg/dL
Ketones, ur: NEGATIVE mg/dL
Nitrite: POSITIVE — AB
Protein, ur: NEGATIVE mg/dL
Specific Gravity, Urine: 1.014 (ref 1.005–1.030)
pH: 6 (ref 5.0–8.0)

## 2020-08-23 LAB — BASIC METABOLIC PANEL
Anion gap: 10 (ref 5–15)
BUN: 17 mg/dL (ref 8–23)
CO2: 25 mmol/L (ref 22–32)
Calcium: 9.6 mg/dL (ref 8.9–10.3)
Chloride: 102 mmol/L (ref 98–111)
Creatinine, Ser: 0.7 mg/dL (ref 0.44–1.00)
GFR, Estimated: >60 mL/min (ref >60)
Glucose, Bld: 128 mg/dL — ABNORMAL HIGH (ref 70–99)
Potassium: 3.7 mmol/L (ref 3.5–5.1)
Sodium: 137 mmol/L (ref 135–145)

## 2020-08-23 LAB — CBC
HCT: 38.8 % (ref 36.0–46.0)
Hemoglobin: 12.9 g/dL (ref 12.0–15.0)
MCH: 26.8 pg (ref 26.0–34.0)
MCHC: 33.2 g/dL (ref 30.0–36.0)
MCV: 80.7 fL (ref 80.0–100.0)
Platelets: 297 10*3/uL (ref 150–400)
RBC: 4.81 MIL/uL (ref 3.87–5.11)
RDW: 14.4 % (ref 11.5–15.5)
WBC: 11.4 10*3/uL — ABNORMAL HIGH (ref 4.0–10.5)
nRBC: 0 % (ref 0.0–0.2)

## 2020-08-23 LAB — TROPONIN I (HIGH SENSITIVITY): Troponin I (High Sensitivity): 3 ng/L (ref <18)

## 2020-08-23 LAB — BRAIN NATRIURETIC PEPTIDE: B Natriuretic Peptide: 55.2 pg/mL (ref 0.0–100.0)

## 2020-08-23 LAB — CBG MONITORING, ED: Glucose-Capillary: 125 mg/dL — ABNORMAL HIGH (ref 70–99)

## 2020-08-23 MED ORDER — CEPHALEXIN 500 MG PO CAPS: 500.0000 mg | ORAL_CAPSULE | Freq: Two times a day (BID) | ORAL | 0 refills | Status: AC

## 2020-08-23 MED ORDER — CEPHALEXIN 250 MG PO CAPS
500.0000 mg | ORAL_CAPSULE | Freq: Once | ORAL | Status: AC
  Administered 2020-08-23: 500 mg via ORAL
  Filled 2020-08-23: qty 2

## 2020-08-23 NOTE — ED Triage Notes (Signed)
Patient here POV from Home with Hypertension and Dizziness.  Patient states shortly after Dinner she began to feel lightheaded and dizzy. Patient arrived Home and checked BP and it was elevated at 170/90.  Mild Nausea. No CP. No SOB. No Diarrhea. History of Atrial Fib.

## 2020-08-23 NOTE — ED Provider Notes (Signed)
[facility] EMERGENCY DEPT Provider Note   CSN: [id number] Arrival date & time: 08/23/20  2145     History Chief Complaint  Patient presents with   Hypertension   Dizziness    Bridget [name] is a 75 y.o. female.  [name] scented to the ED after an episode of dizziness which was described as lightheadedness.  This was also associated with nausea.  She checked her blood pressure, and it was elevated to 170/90.  She took an extra diltiazem and presented to the emergency department for further evaluation.  She denies headache, feeling off balance, focal neurologic symptoms, and chest discomfort.  The history is provided by the patient.  Dizziness Quality:  Lightheadedness Severity:  Moderate Onset quality:  Sudden Duration: less than one hour. Timing:  Constant Progression:  Resolved Chronicity:  New Context comment:  Had just eaten dinner and was sitting in the car Relieved by:  Nothing Worsened by:  Nothing Ineffective treatments:  None tried Associated symptoms: nausea   Associated symptoms: no chest pain, no headaches, no palpitations, no shortness of breath, no vision changes, no vomiting and no weakness       Past Medical History:  Diagnosis Date   Hypertension    Seasonal allergies     Patient Active Problem List   Diagnosis Date Noted   Degenerative arthritis of left knee 11/24/2019   Secondary hypercoagulable state ([place]) 01/26/2019   Greater trochanteric bursitis of right hip 09/23/2018   Lumbar radiculopathy 08/12/2018   Gluteal tendonitis of right buttock 07/29/2018   Atrial fibrillation ([place]) 07/21/2018   Arthritis of left acromioclavicular joint 04/26/2018   Partial nontraumatic tear of left rotator cuff 04/26/2018   Hypertension    Seasonal allergies     Past Surgical History:  Procedure Laterality Date   BREAST EXCISIONAL BIOPSY Left    CHOLECYSTECTOMY  02/2010   KNEE SURGERY     OOPHORECTOMY  01/2003   RSO AT [facility]    VAGINAL HYSTERECTOMY  01/2003   [facility]O A&P REPAIR     OB History     Gravida  3   Para  2   Term  2   Preterm      AB  1   Living  2      SAB      IAB      Ectopic      Multiple      Live Births              Family History  Problem Relation Age of Onset   Heart disease Mother    Diabetes Father        AODM   Hypertension Father    Cancer Father        LUNG   Cancer Brother 23       BLADDER   Breast cancer Sister 73    Social History   Tobacco Use   Smoking status: Never   Smokeless tobacco: Never  Substance Use Topics   Alcohol use: Yes    Comment: SELDOM   Drug use: No    Home Medications Prior to Admission medications   Medication Sig Start Date End Date Taking? Authorizing Provider  benazepril-hydrochlorthiazide (LOTENSIN HCT) 20-25 MG per tablet Take 1 tablet by mouth daily. 04/30/14   [provider]  calcium carbonate (OSCAL) 1500 (600 Ca) MG TABS tablet Take by mouth daily with breakfast.     [provider]  cholecalciferol (VITAMIN D3) 25 MCG (  1000 UNIT) tablet Take 1,000 Units by mouth daily.    [provider]  DILT-XR 180 MG 24 hr capsule Take 180 mg by mouth daily. 04/21/18   [provider]  diltiazem (CARDIZEM CD) 240 MG 24 hr capsule Take 1 capsule (240 mg total) by mouth daily. Pt needs appt with provider before anymore refills 06/28/20   Turner, Eber Hong, MD  ELIQUIS 5 MG TABS tablet TAKE 1 TABLET BY MOUTH TWICE A DAY 03/15/20   Sueanne Margarita, MD  FAMOTIDINE PO Take 2 tablets by mouth. 1 tab in the morning 1 tab in the afternoon    [provider]  furosemide (LASIX) 20 MG tablet Take 1 tablet (20 mg total) by mouth as needed. 06/21/19   Lendon Colonel, NP  KLOR-CON M20 20 MEQ tablet TAKE 1 TABLET BY MOUTH EVERY DAY 05/24/20   Sueanne Margarita, MD  Multiple Vitamin (MULTIVITAMIN WITH MINERALS) TABS tablet Take 1 tablet by mouth daily.    [provider]  pravastatin  (PRAVACHOL) 20 MG tablet Take 20 mg by mouth at bedtime. 04/03/15   [provider]    Allergies    Codeine  Review of Systems   Review of Systems  Constitutional:  Negative for chills and fever.  HENT:  Negative for ear pain and sore throat.   Eyes:  Negative for pain and visual disturbance.  Respiratory:  Negative for cough and shortness of breath.   Cardiovascular:  Positive for leg swelling. Negative for chest pain and palpitations.  Gastrointestinal:  Positive for nausea. Negative for abdominal pain and vomiting.  Genitourinary:  Negative for dysuria and hematuria.  Musculoskeletal:  Negative for arthralgias and back pain.  Skin:  Negative for color change and rash.  Neurological:  Positive for dizziness. Negative for seizures, syncope, weakness and headaches.  All other systems reviewed and are negative.  Physical Exam Updated Vital Signs BP (!) 155/58 (BP Location: Right Arm)   Pulse 70   Temp 98.2 F (36.8 C) (Oral)   Resp 16   Ht '5\' 5"'$  (1.651 m)   Wt 104.3 kg   LMP 01/07/2003   SpO2 100%   BMI 38.27 kg/m   Physical Exam Vitals and nursing note reviewed.  Constitutional:      Appearance: She is well-developed.  HENT:     Head: Normocephalic and atraumatic.  Cardiovascular:     Rate and Rhythm: Normal rate and regular rhythm.     Heart sounds: Normal heart sounds.  Pulmonary:     Effort: Pulmonary effort is normal. No tachypnea.     Breath sounds: Normal breath sounds.  Musculoskeletal:     Right lower leg: Edema present.     Left lower leg: Edema present.     Comments: Mild pitting edema bilaterally to the ankles  Skin:    General: Skin is warm and dry.  Neurological:     General: No focal deficit present.     Mental Status: She is alert and oriented to person, place, and time.  Psychiatric:        Mood and Affect: Mood normal.        Behavior: Behavior normal.    ED Results / Procedures / Treatments   Labs (all labs ordered are listed,  but only abnormal results are displayed) Labs Reviewed  BASIC METABOLIC PANEL - Abnormal; Notable for the following components:      Result Value   Glucose, Bld 128 (*)    All  other components within normal limits  CBC - Abnormal; Notable for the following components:   WBC 11.4 (*)    All other components within normal limits  CBG MONITORING, ED - Abnormal; Notable for the following components:   Glucose-Capillary 125 (*)    All other components within normal limits  BRAIN NATRIURETIC PEPTIDE  URINALYSIS, ROUTINE W REFLEX MICROSCOPIC  TROPONIN I (HIGH SENSITIVITY)    EKG EKG Interpretation  Date/Time:  Thursday August 23 2020 22:02:16 EDT Ventricular Rate:  66 PR Interval:  158 QRS Duration: 98 QT Interval:  438 QTC Calculation: 459 R Axis:   21 Text Interpretation: Sinus rhythm Low voltage, extremity and precordial leads normal axis No acute ischemia Confirmed by Lorre Munroe ([id number]) on 08/23/2020 10:06:35 PM  Radiology No results found.  Procedures Procedures   Medications Ordered in ED Medications - No data to display  ED Course  I have reviewed the triage vital signs and the nursing notes.  Pertinent labs & imaging results that were available during my care of the patient were reviewed by me and considered in my medical decision making (see chart for details).    MDM Rules/Calculators/A&P                           [name] presents with elevated blood pressure and an episode of lightheadedness/nausea.  This is concerning for a possible anginal equivalent.  Currently she is feeling better, and her blood pressure has trended down from the high that was recorded at home.  She will be evaluated for evidence of hypertensive emergency including ACS.  No evidence of neurologic emergency.  Dizziness was not described as ataxia.  No headache or vomiting.  Therefore, neuroimaging in the form of CT scan, which is the only modality currently available, was  deferred. Final Clinical Impression(s) / ED Diagnoses Final diagnoses:  Primary hypertension  Light headedness    Rx / DC Orders ED Discharge Orders     None        Arnaldo Natal, MD 08/24/20 1725

## 2020-08-23 NOTE — ED Provider Notes (Signed)
Blood pressure (!) 148/64, pulse 60, temperature 98.2 F (36.8 C), temperature source Oral, resp. rate 17, height '5\' 5"'$  (1.651 m), weight 104.3 kg, last menstrual period 01/07/2003, SpO2 100 %.  Assuming care from Dr. Joya Gaskins.  In short, Bridget [name] is a 75 y.o. female with a chief complaint of Hypertension and Dizziness .  Refer to the original H&P for additional details.  The current plan of care is to f/u on delta troponin and reassess.   EKG Interpretation  Date/Time:  Thursday August 23 2020 22:02:16 EDT Ventricular Rate:  66 PR Interval:  158 QRS Duration: 98 QT Interval:  438 QTC Calculation: 459 R Axis:   21 Text Interpretation: Sinus rhythm Low voltage, extremity and precordial leads normal axis No acute ischemia Confirmed by Lorre Munroe ([id number]) on 08/23/2020 10:06:35 PM       UA resulted with evidence of UTI. On questioning patient has had hesitancy and urgency over the last week. Will send for urine cx and start Keflex. Second troponin is pending.   12:26 AM  Delta troponin is unchanged. Plan for discharge with abx and close PCP follow up plan. BP trending to normal range here w/o specific intervention.     Margette Fast, MD 08/24/20 [phone]

## 2020-08-24 LAB — TROPONIN I (HIGH SENSITIVITY): Troponin I (High Sensitivity): 3 ng/L (ref <18)

## 2020-08-24 NOTE — Discharge Instructions (Addendum)
Your labs today showed a likely urine infection. I am starting you on antibiotics and will have you drink plenty of fluids and get some rest. Please call your PCP in the morning to schedule a follow up appointment in the coming week to review your symptoms and ED visit. If you develop any new or suddenly worsening symptoms please return to the ED for evaluation.

## 2020-08-27 LAB — URINE CULTURE: Culture: >=100000 — AB

## 2020-08-28 ENCOUNTER — Other Ambulatory Visit: Payer: Self-pay | Admitting: *Deleted

## 2020-08-28 ENCOUNTER — Telehealth: Payer: Self-pay

## 2020-08-28 ENCOUNTER — Telehealth: Payer: Self-pay | Admitting: Cardiology

## 2020-08-28 MED ORDER — DILTIAZEM HCL ER COATED BEADS 240 MG PO CP24: 240.0000 mg | ORAL_CAPSULE | Freq: Every day | ORAL | 0 refills | Status: DC

## 2020-08-28 NOTE — Telephone Encounter (Signed)
Post ED Visit - Positive Culture Follow-up: Successful Patient Follow-Up  Culture assessed and recommendations reviewed by:  '[]'$  Elenor Quinones, Pharm.D. '[]'$  Heide Guile, Pharm.D., BCPS AQ-ID '[]'$  Parks Neptune, Pharm.D., BCPS '[]'$  Alycia Rossetti, Pharm.D., BCPS '[]'$  [place], Pharm.D., BCPS, AAHIVP '[]'$  Legrand Como, Pharm.D., BCPS, AAHIVP '[]'$  Salome Arnt, PharmD, BCPS '[]'$  Johnnette Gourd, PharmD, BCPS '[]'$  Hughes Better, PharmD, BCPS '[]'$  Leeroy Cha, PharmD  Positive urine culture  Pt discharged with Cephalexin.  Plan - If patient is still having symptoms to stop taking Cephalexin and start Amoxicillin '875mg'$  po Q 12 hours x 5 days.  If patients symptoms have resolved to stop taking Cephalexin.  Spoke with patient who states all symptoms are resolved. Instructed pt to stop taking Cephalexin.   Changes discussed with ED provider: Shelby Dubin, PA   Contacted patient, date 08/28/2020, time 11:01 am   Glennon Hamilton 08/28/2020, 10:59 AM

## 2020-08-28 NOTE — Progress Notes (Signed)
ED Antimicrobial Stewardship Positive Culture Follow Up   Bridget [name] is an 75 y.o. female who presented to [facility] on 08/23/2020 with a chief complaint of  Chief Complaint  Patient presents with   Hypertension   Dizziness    Recent Results (from the past 720 hour(s))  Urine Culture     Status: Abnormal   Collection Time: 08/23/20 11:03 PM   Specimen: Urine, Clean Catch  Result Value Ref Range Status   Specimen Description   Final    URINE, CLEAN CATCH Performed at [facility], [address], [place], [place] [address]    Special Requests   Final    NONE Performed at [facility], [address] [place], [place] [address]    Culture (A)  Final    >=100,000 COLONIES/mL ESCHERICHIA COLI 80,000 COLONIES/mL ENTEROCOCCUS FAECALIS    Report Status 08/27/2020 FINAL  Final   Organism ID, Bacteria ENTEROCOCCUS FAECALIS (A)  Final   Organism ID, Bacteria ESCHERICHIA COLI (A)  Final      Susceptibility   Escherichia coli - MIC*    AMPICILLIN <=2 SENSITIVE Sensitive     CEFAZOLIN <=4 SENSITIVE Sensitive     CEFEPIME <=0.12 SENSITIVE Sensitive     CEFTRIAXONE <=0.25 SENSITIVE Sensitive     CIPROFLOXACIN <=0.25 SENSITIVE Sensitive     GENTAMICIN <=1 SENSITIVE Sensitive     IMIPENEM <=0.25 SENSITIVE Sensitive     NITROFURANTOIN <=16 SENSITIVE Sensitive     TRIMETH/SULFA <=20 SENSITIVE Sensitive     AMPICILLIN/SULBACTAM <=2 SENSITIVE Sensitive     PIP/TAZO <=4 SENSITIVE Sensitive     * >=100,000 COLONIES/mL ESCHERICHIA COLI   Enterococcus faecalis - MIC*    AMPICILLIN <=2 SENSITIVE Sensitive     NITROFURANTOIN <=16 SENSITIVE Sensitive     VANCOMYCIN <=0.5 SENSITIVE Sensitive     * 80,000 COLONIES/mL ENTEROCOCCUS FAECALIS    '[x]'$  Treated with cephalexin, organism resistant to prescribed antimicrobial  Plan:  Ask patient if symptoms have resolved.  If still having symptoms:       Stop cephalexin      Start amoxicillin '875mg'$  BID  x5 days If symptoms have resolved:       Stop cephalexin  ED Provider: Shelby Dubin, PA-C   Donald Pore 08/28/2020, 9:26 AM Clinical Pharmacist Monday - Friday phone -  [phone] Saturday - Sunday phone - [phone]

## 2020-08-28 NOTE — Telephone Encounter (Signed)
*  STAT* If patient is at the pharmacy, call can be transferred to refill team.   1. Which medications need to be refilled? (please list name of each medication and dose if known) diltiazem (CARDIZEM CD) 240 MG 24 hr capsule  2. Which pharmacy/location (including street and city if local pharmacy) is medication to be sent to? [id number] [place] - [address]  3. Do they need a 30 day or 90 day supply? 90 day   Patient has an appointment 12/05/2020

## 2020-08-31 ENCOUNTER — Other Ambulatory Visit: Payer: Self-pay | Admitting: Family Medicine

## 2020-08-31 DIAGNOSIS — Z1231 Encounter for screening mammogram for malignant neoplasm of breast: Secondary | ICD-10-CM

## 2020-09-03 ENCOUNTER — Other Ambulatory Visit: Payer: Self-pay

## 2020-09-03 ENCOUNTER — Ambulatory Visit
Admission: RE | Admit: 2020-09-03 | Discharge: 2020-09-03 | Disposition: A | Discharge status: Home / Self Care | Payer: Medicare Other | Source: Ambulatory Visit | Attending: Family Medicine | Admitting: Family Medicine

## 2020-09-03 DIAGNOSIS — Z1231 Encounter for screening mammogram for malignant neoplasm of breast: Secondary | ICD-10-CM | POA: Diagnosis not present

## 2020-09-04 DIAGNOSIS — N3000 Acute cystitis without hematuria: Secondary | ICD-10-CM | POA: Diagnosis not present

## 2020-09-09 ENCOUNTER — Other Ambulatory Visit (HOSPITAL_COMMUNITY): Payer: Self-pay | Admitting: Cardiology

## 2020-09-11 NOTE — Telephone Encounter (Signed)
Eliquis '5mg'$  refill request received. Patient is 75 years old, weight-104.3kg, Crea-0.70 on 08/23/2020, Diagnosis-Afib, and last seen by Dr. Radford Pax on 11/30/2019 and pending an appt on 12/05/20. Dose is appropriate based on dosing criteria. Will send in refill to requested pharmacy.

## 2020-09-18 DIAGNOSIS — R399 Unspecified symptoms and signs involving the genitourinary system: Secondary | ICD-10-CM | POA: Diagnosis not present

## 2020-10-03 DIAGNOSIS — Z79899 Other long term (current) drug therapy: Secondary | ICD-10-CM | POA: Diagnosis not present

## 2020-10-03 DIAGNOSIS — I10 Essential (primary) hypertension: Secondary | ICD-10-CM | POA: Diagnosis not present

## 2020-10-03 DIAGNOSIS — R399 Unspecified symptoms and signs involving the genitourinary system: Secondary | ICD-10-CM | POA: Diagnosis not present

## 2020-10-08 DIAGNOSIS — Z23 Encounter for immunization: Secondary | ICD-10-CM | POA: Diagnosis not present

## 2020-10-17 DIAGNOSIS — Z79899 Other long term (current) drug therapy: Secondary | ICD-10-CM | POA: Diagnosis not present

## 2020-11-02 DIAGNOSIS — N3000 Acute cystitis without hematuria: Secondary | ICD-10-CM | POA: Diagnosis not present

## 2020-11-20 DIAGNOSIS — H52223 Regular astigmatism, bilateral: Secondary | ICD-10-CM | POA: Diagnosis not present

## 2020-11-20 DIAGNOSIS — H5203 Hypermetropia, bilateral: Secondary | ICD-10-CM | POA: Diagnosis not present

## 2020-11-20 DIAGNOSIS — H524 Presbyopia: Secondary | ICD-10-CM | POA: Diagnosis not present

## 2020-11-20 DIAGNOSIS — H43813 Vitreous degeneration, bilateral: Secondary | ICD-10-CM | POA: Diagnosis not present

## 2020-11-20 DIAGNOSIS — H2513 Age-related nuclear cataract, bilateral: Secondary | ICD-10-CM | POA: Diagnosis not present

## 2020-11-20 DIAGNOSIS — H35373 Puckering of macula, bilateral: Secondary | ICD-10-CM | POA: Diagnosis not present

## 2020-12-05 ENCOUNTER — Ambulatory Visit (INDEPENDENT_AMBULATORY_CARE_PROVIDER_SITE_OTHER): Payer: Medicare Other | Admitting: Cardiology

## 2020-12-05 ENCOUNTER — Other Ambulatory Visit: Payer: Self-pay

## 2020-12-05 ENCOUNTER — Encounter: Payer: Self-pay | Admitting: Cardiology

## 2020-12-05 VITALS — BP 132/76 | HR 75 | Ht 65.0 in | Wt 228.8 lb

## 2020-12-05 DIAGNOSIS — I10 Essential (primary) hypertension: Secondary | ICD-10-CM | POA: Diagnosis not present

## 2020-12-05 DIAGNOSIS — I480 Paroxysmal atrial fibrillation: Secondary | ICD-10-CM

## 2020-12-05 DIAGNOSIS — E669 Obesity, unspecified: Secondary | ICD-10-CM

## 2020-12-05 DIAGNOSIS — G4733 Obstructive sleep apnea (adult) (pediatric): Secondary | ICD-10-CM

## 2020-12-05 DIAGNOSIS — R600 Localized edema: Secondary | ICD-10-CM

## 2020-12-05 MED ORDER — BENAZEPRIL-HYDROCHLOROTHIAZIDE 20-25 MG PO TABS: 1.0000 | ORAL_TABLET | Freq: Every day | ORAL | 3 refills | Status: DC

## 2020-12-05 MED ORDER — DILTIAZEM HCL ER COATED BEADS 240 MG PO CP24
240.0000 mg | ORAL_CAPSULE | Freq: Every day | ORAL | 3 refills | Status: DC
Start: 2020-12-05 — End: 2021-10-10

## 2020-12-05 MED ORDER — BENAZEPRIL HCL 20 MG PO TABS: 20.0000 mg | ORAL_TABLET | Freq: Every day | ORAL | 3 refills | Status: DC

## 2020-12-05 NOTE — Progress Notes (Signed)
Date:  12/05/2020   ID:  Bridget [name], DOB 1945-04-25, MRN [id number]   PCP:  Jonathon Jordan, MD  Sleep Medicine:  Fransico Him, MD Electrophysiologist:  None   Chief Complaint:  OSA  History of Present Illness:    Bridget [name] is a 75 y.o. female with a hx of PAF.   She has had some excessive daytime sleepiness as well as snoring.  She underwent sleep study showing mild OSA with an AHI of 11.1/hr but severe during REM sleep with an AHi of 49/hr.  O2 sats were as low as 89%.  She underwent CPAP titration to 6 cm H2O.   She is here today for followup and is doing well.  She denies any chest pain or pressure, SOB, DOE, PND, orthopnea, LE edema, dizziness, palpitations or syncope. She is compliant with her meds and is tolerating meds with no SE.     She is doing well with her CPAP device and thinks that she has gotten used to it.  She tolerates the mask and feels the pressure is adequate.  Since going on CPAP she feels rested in the am and has no significant daytime sleepiness.  She denies any significant mouth or nasal dryness or nasal congestion.  She does not think that she snores.     Prior CV studies:   The following studies were reviewed today:  PAP compliance download  Past Medical History:  Diagnosis Date   Hypertension    Seasonal allergies    Past Surgical History:  Procedure Laterality Date   BREAST EXCISIONAL BIOPSY Left    CHOLECYSTECTOMY  02/2010   KNEE SURGERY     OOPHORECTOMY  01/2003   RSO AT [facility]   VAGINAL HYSTERECTOMY  01/2003   [facility] A&P REPAIR     Current Meds  Medication Sig   benazepril (LOTENSIN) 20 MG tablet Take 20 mg by mouth daily.   benazepril-hydrochlorthiazide (LOTENSIN HCT) 20-25 MG per tablet Take 1 tablet by mouth daily.   calcium carbonate (OSCAL) 1500 (600 Ca) MG TABS tablet Take by mouth daily with breakfast.    cholecalciferol (VITAMIN D3) 25 MCG (1000 UNIT) tablet Take 1,000 Units by mouth daily.   DILT-XR 180 MG 24 hr  capsule Take 180 mg by mouth daily.   diltiazem (CARDIZEM CD) 240 MG 24 hr capsule Take 1 capsule (240 mg total) by mouth daily.   ELIQUIS 5 MG TABS tablet TAKE 1 TABLET BY MOUTH TWICE A DAY   FAMOTIDINE PO Take 1 tablet by mouth. 1 tab in the morning 1 tab in the afternoon   fluticasone (FLONASE) 50 MCG/ACT nasal spray Place 1 spray into both nostrils daily as needed.   KLOR-CON M20 20 MEQ tablet TAKE 1 TABLET BY MOUTH EVERY DAY   loratadine (CLARITIN) 10 MG tablet Take 1 tablet by mouth daily as needed.   Multiple Vitamin (MULTIVITAMIN WITH MINERALS) TABS tablet Take 1 tablet by mouth daily.   pravastatin (PRAVACHOL) 20 MG tablet Take 20 mg by mouth at bedtime.     Allergies:   Codeine   Social History   Tobacco Use   Smoking status: Never   Smokeless tobacco: Never  Substance Use Topics   Alcohol use: Yes    Comment: SELDOM   Drug use: No     Family Hx: The patient's family history includes Breast cancer (age of onset: 86) in her sister; Cancer in her father; Cancer (age of onset: 63) in her brother; Diabetes in  her father; Heart disease in her mother; Hypertension in her father.  ROS:   Please see the history of present illness.     All other systems reviewed and are negative.   Labs/Other Tests and Data Reviewed:    Recent Labs: 08/23/2020: B Natriuretic Peptide 55.2; BUN 17; Creatinine, Ser 0.70; Hemoglobin 12.9; Platelets 297; Potassium 3.7; Sodium 137   Recent Lipid Panel No results found for: CHOL, TRIG, HDL, CHOLHDL, LDLCALC, LDLDIRECT  Wt Readings from Last 3 Encounters:  12/05/20 228 lb 12.8 oz (103.8 kg)  08/23/20 230 lb (104.3 kg)  05/11/20 233 lb (105.7 kg)     Objective:    Vital Signs:  BP 132/76 (BP Location: Right Arm, Patient Position: Sitting, Cuff Size: Large)   Pulse 75   Ht 5\' 5"  (1.651 m)   Wt 228 lb 12.8 oz (103.8 kg)   LMP 01/07/2003   SpO2 98%   BMI 38.07 kg/m    GEN: Well nourished, well developed in no acute distress HEENT:  Normal NECK: No JVD; No carotid bruits LYMPHATICS: No lymphadenopathy CARDIAC:RRR, no murmurs, rubs, gallops RESPIRATORY:  Clear to auscultation without rales, wheezing or rhonchi  ABDOMEN: Soft, non-tender, non-distended MUSCULOSKELETAL:  trace LE edema; No deformity  SKIN: Warm and dry NEUROLOGIC:  Alert and oriented x 3 PSYCHIATRIC:  Normal affect    ASSESSMENT & PLAN:    1.  OSA - The patient is tolerating PAP therapy well without any problems. The PAP download performed by his DME was personally reviewed and interpreted by me today and showed an AHI of 2.7 /hr on 6 cm H2O with 97 % compliance in using more than 4 hours nightly.  The patient has been using and benefiting from PAP use and will continue to benefit from therapy.    2.  Obesity -I have encouraged her to get into a routine exercise program and cut back on carbs and portions.   3.  HTN -Her BP is well controlled on exam today -Continue prescription drug management with Diltiazem XR 240 mg daily and benazepril/HCT 20-25 mg daily and Benazepril 20mg  daily with as needed refills  4.  Chronic LE edema -she still has swelling mainly at night  -continue diuretics  5.  PAF -She continues to maintain normal sinus rhythm on exam today -She will occasionally have some palpitations but they are short-lived and not sustained -she denies any bleeding on the DOAC -I have personally reviewed and interpreted outside labs performed by patient's PCP which showed serum creatinine 0.64, potassium 3.9 on 10/17/2020 and hemoglobin 12.9 on 08/23/2020  -Continue prescription drug management with diltiazem XR 240 mg daily and Eliquis 5 mg twice daily  Medication Adjustments/Labs and Tests Ordered: Current medicines are reviewed at length with the patient today.  Concerns regarding medicines are outlined above.  Tests Ordered: No orders of the defined types were placed in this encounter.  Medication Changes: No orders of the defined types  were placed in this encounter.   Disposition:  Follow up in 1 year(s)  Signed, Fransico Him, MD  12/05/2020 3:46 PM    [place]

## 2020-12-05 NOTE — Patient Instructions (Signed)
Medication Instructions:  Your physician recommends that you continue on your current medications as directed. Please refer to the Current Medication list given to you today.  *If you need a refill on your cardiac medications before your next appointment, please call your pharmacy*  Follow-Up: At [facility], you and your health needs are our priority.  As part of our continuing mission to provide you with exceptional heart care, we have created designated Provider Care Teams.  These Care Teams include your primary Cardiologist (physician) and Advanced Practice Providers (APPs -  Physician Assistants and Nurse Practitioners) who all work together to provide you with the care you need, when you need it.  Your next appointment:   6 month(s)  The format for your next appointment:   In Person  Provider:   Traci Turner, MD   

## 2020-12-05 NOTE — Addendum Note (Signed)
Addended by: Antonieta Iba on: 12/05/2020 04:02 PM   Modules accepted: Orders

## 2020-12-13 DIAGNOSIS — Z79899 Other long term (current) drug therapy: Secondary | ICD-10-CM | POA: Diagnosis not present

## 2020-12-13 DIAGNOSIS — R7303 Prediabetes: Secondary | ICD-10-CM | POA: Diagnosis not present

## 2020-12-13 DIAGNOSIS — I519 Heart disease, unspecified: Secondary | ICD-10-CM | POA: Diagnosis not present

## 2020-12-13 DIAGNOSIS — I4891 Unspecified atrial fibrillation: Secondary | ICD-10-CM | POA: Diagnosis not present

## 2020-12-13 DIAGNOSIS — I8310 Varicose veins of unspecified lower extremity with inflammation: Secondary | ICD-10-CM | POA: Diagnosis not present

## 2020-12-13 DIAGNOSIS — D6869 Other thrombophilia: Secondary | ICD-10-CM | POA: Diagnosis not present

## 2020-12-13 DIAGNOSIS — N390 Urinary tract infection, site not specified: Secondary | ICD-10-CM | POA: Diagnosis not present

## 2020-12-13 DIAGNOSIS — Z0000 Encounter for general adult medical examination without abnormal findings: Secondary | ICD-10-CM | POA: Diagnosis not present

## 2020-12-13 DIAGNOSIS — I10 Essential (primary) hypertension: Secondary | ICD-10-CM | POA: Diagnosis not present

## 2020-12-13 DIAGNOSIS — E7800 Pure hypercholesterolemia, unspecified: Secondary | ICD-10-CM | POA: Diagnosis not present

## 2020-12-13 DIAGNOSIS — E559 Vitamin D deficiency, unspecified: Secondary | ICD-10-CM | POA: Diagnosis not present

## 2020-12-13 DIAGNOSIS — E2839 Other primary ovarian failure: Secondary | ICD-10-CM | POA: Diagnosis not present

## 2020-12-15 ENCOUNTER — Other Ambulatory Visit: Payer: Self-pay | Admitting: Cardiology

## 2020-12-17 ENCOUNTER — Other Ambulatory Visit: Payer: Self-pay | Admitting: Family Medicine

## 2020-12-17 DIAGNOSIS — E2839 Other primary ovarian failure: Secondary | ICD-10-CM

## 2020-12-17 NOTE — Telephone Encounter (Signed)
Prescription refill request for Eliquis received.  Indication: afib  Last office visit: Turner, 12/05/2020 Scr: 0.7, 08/23/2020 Age: 75 yo  Weight: 103.8 kg   Refill sent.

## 2021-03-12 NOTE — Progress Notes (Signed)
?[name] D.O. ?[facility] ?[place] ?Phone: [phone] ?Subjective:   ? ?I'm seeing this patient by the request  of:  Jonathon Jordan, MD ? ?CC left knee pain ? ?[id number]  ?Bridget [name] is a 76 y.o. female coming in with complaint of L knee pain. Patient last seen in May 2022. Patient states that she is in constant pain. Walking, stairs and at night she has most of her pain. Considering surgery this summer. Injection last year helped up until 2 months ago.  ? ?  ? ?Past Medical History:  ?Diagnosis Date  ? Hypertension   ? Seasonal allergies   ? ?Past Surgical History:  ?Procedure Laterality Date  ? BREAST EXCISIONAL BIOPSY Left   ? CHOLECYSTECTOMY  02/2010  ? KNEE SURGERY    ? OOPHORECTOMY  01/2003  ? RSO AT [facility]  ? VAGINAL HYSTERECTOMY  01/2003  ? [facility] A&P REPAIR  ? ?Social History  ? ?Socioeconomic History  ? Marital status: Married  ?  Spouse name: Not on file  ? Number of children: Not on file  ? Years of education: Not on file  ? Highest education level: Not on file  ?Occupational History  ? Not on file  ?Tobacco Use  ? Smoking status: Never  ? Smokeless tobacco: Never  ?Substance and Sexual Activity  ? Alcohol use: Yes  ?  Comment: SELDOM  ? Drug use: No  ? Sexual activity: Not on file  ?  Comment: HYST  ?Other Topics Concern  ? Not on file  ?Social History Narrative  ? Not on file  ? ?Social Determinants of Health  ? ?Financial Resource Strain: Not on file  ?Food Insecurity: Not on file  ?Transportation Needs: Not on file  ?Physical Activity: Not on file  ?Stress: Not on file  ?Social Connections: Not on file  ? ?Allergies  ?Allergen Reactions  ? Codeine Nausea Only  ? ?Family History  ?Problem Relation Age of Onset  ? Heart disease Mother   ? Diabetes Father   ?     AODM  ? Hypertension Father   ? Cancer Father   ?     LUNG  ? Cancer Brother 48  ?     BLADDER  ? Breast cancer Sister 65  ? ? ? ?Current Outpatient Medications (Cardiovascular):  ?   benazepril (LOTENSIN) 20 MG tablet, Take 1 tablet (20 mg total) by mouth daily. ?  benazepril-hydrochlorthiazide (LOTENSIN HCT) 20-25 MG tablet, Take 1 tablet by mouth daily. ?  DILT-XR 180 MG 24 hr capsule, Take 180 mg by mouth daily. ?  diltiazem (CARDIZEM CD) 240 MG 24 hr capsule, Take 1 capsule (240 mg total) by mouth daily. ?  pravastatin (PRAVACHOL) 20 MG tablet, Take 20 mg by mouth at bedtime. ?  furosemide (LASIX) 20 MG tablet, Take 1 tablet (20 mg total) by mouth as needed. ? ?Current Outpatient Medications (Respiratory):  ?  fluticasone (FLONASE) 50 MCG/ACT nasal spray, Place 1 spray into both nostrils daily as needed. ?  loratadine (CLARITIN) 10 MG tablet, Take 1 tablet by mouth daily as needed. ? ? ?Current Outpatient Medications (Hematological):  ?  ELIQUIS 5 MG TABS tablet, TAKE 1 TABLET BY MOUTH TWICE A DAY ? ?Current Outpatient Medications (Other):  ?  calcium carbonate (OSCAL) 1500 (600 Ca) MG TABS tablet, Take by mouth daily with breakfast.  ?  cholecalciferol (VITAMIN D3) 25 MCG (1000 UNIT) tablet, Take 1,000 Units by mouth daily. ?  Multiple Vitamin (MULTIVITAMIN WITH MINERALS) TABS tablet, Take 1 tablet by mouth daily. ?  potassium chloride SA (KLOR-CON M20) 20 MEQ tablet, TAKE 1 TABLET BY MOUTH EVERY DAY ?  FAMOTIDINE PO, Take 1 tablet by mouth. 1 tab in the morning 1 tab in the afternoon ? ? ? ?Review of Systems: ? No headache, visual changes, nausea, vomiting, diarrhea, constipation, dizziness, abdominal pain, skin rash, fevers, chills, night sweats, weight loss, swollen lymph nodes, body aches, chest pain, shortness of breath, mood changes. POSITIVE muscle aches, joint swelling ? ?Objective  ?Blood pressure 120/70, pulse 68, height '5\' 5"'$  (1.651 m), weight 236 lb (107 kg), last menstrual period 01/07/2003, SpO2 99 %. ?  ?General: No apparent distress alert and oriented x3 mood and affect normal, dressed appropriately.  ?HEENT: Pupils equal, extraocular movements intact  ?Respiratory:  Patient's speak in full sentences and does not appear short of breath  ?Cardiovascular: No lower extremity edema, non tender, no erythema  ?Gait n antalgic gait ?Left knee does have some arthritic changes.  Trace effusion noted of the patellofemoral joint.  Patient lacks last 10 degrees of flexion.  Instability with valgus and varus force.  More tenderness over the lateral joint line ? ?After informed written and verbal consent, patient was seated on exam table. Left knee was prepped with alcohol swab and utilizing anterolateral approach, patient's left knee space was injected with 4:1  marcaine 0.5%: Kenalog '40mg'$ /dL. Patient tolerated the procedure well without immediate complications. ?  ?Impression and Recommendations:  ?  ? ?The above documentation has been reviewed and is accurate and complete Lyndal Pulley, DO ? ? ? ?

## 2021-03-13 ENCOUNTER — Encounter: Payer: Self-pay | Admitting: Family Medicine

## 2021-03-13 ENCOUNTER — Ambulatory Visit (INDEPENDENT_AMBULATORY_CARE_PROVIDER_SITE_OTHER): Payer: Medicare Other | Admitting: Family Medicine

## 2021-03-13 ENCOUNTER — Other Ambulatory Visit: Payer: Self-pay

## 2021-03-13 ENCOUNTER — Ambulatory Visit (INDEPENDENT_AMBULATORY_CARE_PROVIDER_SITE_OTHER): Payer: Medicare Other

## 2021-03-13 VITALS — BP 120/70 | HR 68 | Ht 65.0 in | Wt 236.0 lb

## 2021-03-13 DIAGNOSIS — M1712 Unilateral primary osteoarthritis, left knee: Secondary | ICD-10-CM

## 2021-03-13 DIAGNOSIS — M25562 Pain in left knee: Secondary | ICD-10-CM | POA: Diagnosis not present

## 2021-03-13 DIAGNOSIS — G8929 Other chronic pain: Secondary | ICD-10-CM

## 2021-03-13 NOTE — Patient Instructions (Addendum)
Injected knee today ?Gel injection will get approved just in case ?See me in 6 weeks ?

## 2021-03-13 NOTE — Assessment & Plan Note (Signed)
Chronic problem with exacerbation.  Given injection today.  With patient being on a blood thinner very difficult to do any other type of treatment.  Patient would like to consider the possibility of surgical intervention but would like to get repeat x-rays to further evaluate the severity of the arthritis.  We discussed with patient to continue to work on some weight loss.  Needs to lose approximately 8 pounds to be a surgical candidate.  We will get approval for viscosupplementation as well and follow-up again in 6 weeks ?

## 2021-04-23 NOTE — Progress Notes (Signed)
?[name] D.O. ?[facility] ?[place] ?Phone: [phone] ?Subjective:   ?I, Jacqualin Combes, am serving as a scribe for Dr. Hulan Saas. ? ?This visit occurred during the SARS-CoV-2 public health emergency.  Safety protocols were in place, including screening questions prior to the visit, additional usage of staff PPE, and extensive cleaning of exam room while observing appropriate contact time as indicated for disinfecting solutions.  ? ? ?I'm seeing this patient by the request  of:  Jonathon Jordan, MD ? ?CC: Left knee pain follow-up ? ?[id number]  ?03/13/2021 ?Chronic problem with exacerbation.  Given injection today.  With patient being on a blood thinner very difficult to do any other type of treatment.  Patient would like to consider the possibility of surgical intervention but would like to get repeat x-rays to further evaluate the severity of the arthritis.  We discussed with patient to continue to work on some weight loss.  Needs to lose approximately 8 pounds to be a surgical candidate.  We will get approval for viscosupplementation as well and follow-up again in 6 weeks ? ?Update 04/24/2021 ?Bridget [name] is a 76 y.o. female coming in with complaint of L knee pain. Patient states that she continues to have pain over lateral aspect with deep knee bending. Pain does not keep her awake at night and is able to stand for longer periods since injection.  ? ? ? ?  ? ?Past Medical History:  ?Diagnosis Date  ? Hypertension   ? Seasonal allergies   ? ?Past Surgical History:  ?Procedure Laterality Date  ? BREAST EXCISIONAL BIOPSY Left   ? CHOLECYSTECTOMY  02/2010  ? KNEE SURGERY    ? OOPHORECTOMY  01/2003  ? [facility]SO AT [facility]  ? VAGINAL HYSTERECTOMY  01/2003  ? [facility]O A&P REPAIR  ? ?Social History  ? ?Socioeconomic History  ? Marital status: Married  ?  Spouse name: Not on file  ? Number of children: Not on file  ? Years of education: Not on file  ? Highest education  level: Not on file  ?Occupational History  ? Not on file  ?Tobacco Use  ? Smoking status: Never  ? Smokeless tobacco: Never  ?Substance and Sexual Activity  ? Alcohol use: Yes  ?  Comment: SELDOM  ? Drug use: No  ? Sexual activity: Not on file  ?  Comment: HYST  ?Other Topics Concern  ? Not on file  ?Social History Narrative  ? Not on file  ? ?Social Determinants of Health  ? ?Financial Resource Strain: Not on file  ?Food Insecurity: Not on file  ?Transportation Needs: Not on file  ?Physical Activity: Not on file  ?Stress: Not on file  ?Social Connections: Not on file  ? ?Allergies  ?Allergen Reactions  ? Codeine Nausea Only  ? ?Family History  ?Problem Relation Age of Onset  ? Heart disease Mother   ? Diabetes Father   ?     AODM  ? Hypertension Father   ? Cancer Father   ?     LUNG  ? Cancer Brother 76  ?     BLADDER  ? Breast cancer Sister 18  ? ? ? ?Current Outpatient Medications (Cardiovascular):  ?  benazepril (LOTENSIN) 20 MG tablet, Take 1 tablet (20 mg total) by mouth daily. ?  benazepril-hydrochlorthiazide (LOTENSIN HCT) 20-25 MG tablet, Take 1 tablet by mouth daily. ?  diltiazem (CARDIZEM CD) 240 MG 24 hr capsule, Take 1 capsule (240  mg total) by mouth daily. ?  pravastatin (PRAVACHOL) 20 MG tablet, Take 20 mg by mouth at bedtime. ?  DILT-XR 180 MG 24 hr capsule, Take 180 mg by mouth daily. ?  furosemide (LASIX) 20 MG tablet, Take 1 tablet (20 mg total) by mouth as needed. ? ?Current Outpatient Medications (Respiratory):  ?  fluticasone (FLONASE) 50 MCG/ACT nasal spray, Place 1 spray into both nostrils daily as needed. ?  loratadine (CLARITIN) 10 MG tablet, Take 1 tablet by mouth daily as needed. ? ? ?Current Outpatient Medications (Hematological):  ?  ELIQUIS 5 MG TABS tablet, TAKE 1 TABLET BY MOUTH TWICE A DAY ? ?Current Outpatient Medications (Other):  ?  calcium carbonate (OSCAL) 1500 (600 Ca) MG TABS tablet, Take by mouth daily with breakfast.  ?  cholecalciferol (VITAMIN D3) 25 MCG (1000 UNIT)  tablet, Take 1,000 Units by mouth daily. ?  Multiple Vitamin (MULTIVITAMIN WITH MINERALS) TABS tablet, Take 1 tablet by mouth daily. ?  potassium chloride SA (KLOR-CON M20) 20 MEQ tablet, TAKE 1 TABLET BY MOUTH EVERY DAY ?  FAMOTIDINE PO, Take 1 tablet by mouth. 1 tab in the morning 1 tab in the afternoon ? ? ?Reviewed prior external information including notes and imaging from  ?primary care provider ?As well as notes that were available from care everywhere and other healthcare systems. ? ?Past medical history, social, surgical and family history all reviewed in electronic medical record.  No pertanent information unless stated regarding to the chief complaint.  ? ?Review of Systems: ? No headache, visual changes, nausea, vomiting, diarrhea, constipation, dizziness, abdominal pain, skin rash, fevers, chills, night sweats, weight loss, swollen lymph nodes, body aches, joint swelling, chest pain, shortness of breath, mood changes. POSITIVE muscle aches ? ?Objective  ?Blood pressure 118/68, pulse 72, height '5\' 5"'$  (1.651 m), weight 233 lb (105.7 kg), last menstrual period 01/07/2003, SpO2 96 %. ?  ?General: No apparent distress alert and oriented x3 mood and affect normal, dressed appropriately.  ?HEENT: Pupils equal, extraocular movements intact  ?Respiratory: Patient's speak in full sentences and does not appear short of breath  ?Cardiovascular: No lower extremity edema, non tender, no erythema  ?Gait mild antalgic ?Left knee does have arthritic changes noted.  Trace effusion noted.  Lacks last 10 degrees of flexion of the knee.  Instability with valgus and varus force. ? ?After informed written and verbal consent, patient was seated on exam table. Left knee was prepped with alcohol swab and utilizing anterolateral approach, patient's left knee space was injected with 40 mg per 3 mL of Monovisc (sodium hyaluronate) in a prefilled syringe was injected easily into the knee through a 22-gauge needle..Patient tolerated  the procedure well without immediate complications. ?  ?Impression and Recommendations:  ?  ? ?The above documentation has been reviewed and is accurate and complete Lyndal Pulley, DO ? ? ?

## 2021-04-24 ENCOUNTER — Ambulatory Visit (INDEPENDENT_AMBULATORY_CARE_PROVIDER_SITE_OTHER): Payer: Medicare Other | Admitting: Family Medicine

## 2021-04-24 ENCOUNTER — Encounter: Payer: Self-pay | Admitting: Family Medicine

## 2021-04-24 DIAGNOSIS — M1712 Unilateral primary osteoarthritis, left knee: Secondary | ICD-10-CM

## 2021-04-24 NOTE — Assessment & Plan Note (Signed)
Chronic problem with exacerbation.  Still wants to hold on any type of surgical intervention if possible.  We will see how patient responds.  Discussed icing regimen and home exercises, which activities to do which ones to avoid.  Follow-up with me again 2 to 3 months. ?

## 2021-04-24 NOTE — Patient Instructions (Addendum)
Good to see you ?Injected monovisc into L knee today ?See me again in 2-3 months  ?

## 2021-04-25 DIAGNOSIS — H938X1 Other specified disorders of right ear: Secondary | ICD-10-CM | POA: Diagnosis not present

## 2021-05-21 DIAGNOSIS — H43813 Vitreous degeneration, bilateral: Secondary | ICD-10-CM | POA: Diagnosis not present

## 2021-05-21 DIAGNOSIS — H5203 Hypermetropia, bilateral: Secondary | ICD-10-CM | POA: Diagnosis not present

## 2021-05-21 DIAGNOSIS — H2513 Age-related nuclear cataract, bilateral: Secondary | ICD-10-CM | POA: Diagnosis not present

## 2021-05-21 DIAGNOSIS — H35373 Puckering of macula, bilateral: Secondary | ICD-10-CM | POA: Diagnosis not present

## 2021-05-21 DIAGNOSIS — H524 Presbyopia: Secondary | ICD-10-CM | POA: Diagnosis not present

## 2021-05-21 DIAGNOSIS — H52223 Regular astigmatism, bilateral: Secondary | ICD-10-CM | POA: Diagnosis not present

## 2021-06-17 ENCOUNTER — Ambulatory Visit: Payer: Medicare Other | Admitting: Cardiology

## 2021-06-18 ENCOUNTER — Encounter: Payer: Self-pay | Admitting: Cardiology

## 2021-06-18 ENCOUNTER — Ambulatory Visit (INDEPENDENT_AMBULATORY_CARE_PROVIDER_SITE_OTHER): Payer: Medicare Other | Admitting: Cardiology

## 2021-06-18 VITALS — BP 114/70 | HR 66 | Ht 65.0 in | Wt 233.4 lb

## 2021-06-18 DIAGNOSIS — E669 Obesity, unspecified: Secondary | ICD-10-CM | POA: Diagnosis not present

## 2021-06-18 DIAGNOSIS — R600 Localized edema: Secondary | ICD-10-CM

## 2021-06-18 DIAGNOSIS — I480 Paroxysmal atrial fibrillation: Secondary | ICD-10-CM

## 2021-06-18 DIAGNOSIS — G4733 Obstructive sleep apnea (adult) (pediatric): Secondary | ICD-10-CM | POA: Diagnosis not present

## 2021-06-18 DIAGNOSIS — I10 Essential (primary) hypertension: Secondary | ICD-10-CM | POA: Diagnosis not present

## 2021-06-18 NOTE — Patient Instructions (Signed)
Medication Instructions:  Your physician recommends that you continue on your current medications as directed. Please refer to the Current Medication list given to you today.  *If you need a refill on your cardiac medications before your next appointment, please call your pharmacy*   Lab Work: None ordered   If you have labs (blood work) drawn today and your tests are completely normal, you will receive your results only by: [place] (if you have MyChart) OR A paper copy in the mail If you have any lab test that is abnormal or we need to change your treatment, we will call you to review the results.   Testing/Procedures: None ordered    Follow-Up: At [facility], you and your health needs are our priority.  As part of our continuing mission to provide you with exceptional heart care, we have created designated Provider Care Teams.  These Care Teams include your primary Cardiologist (physician) and Advanced Practice Providers (APPs -  Physician Assistants and Nurse Practitioners) who all work together to provide you with the care you need, when you need it.  We recommend signing up for the patient portal called "MyChart".  Sign up information is provided on this After Visit Summary.  MyChart is used to connect with patients for Virtual Visits (Telemedicine).  Patients are able to view lab/test results, encounter notes, upcoming appointments, etc.  Non-urgent messages can be sent to your provider as well.   To learn more about what you can do with MyChart, go to [email or web address].    Your next appointment:   12 month(s)  The format for your next appointment:   In Person  Provider:   Fransico Him, MD     Other Instructions   Important Information About Sugar

## 2021-06-18 NOTE — Progress Notes (Signed)
Date:  06/18/2021   ID:  Bridget [name], DOB Sep 18, 1945, MRN [id number]   PCP:  Jonathon Jordan, MD  Sleep Medicine:  Fransico Him, MD Electrophysiologist:  None   Chief Complaint:  OSA  History of Present Illness:    Bridget [name] is a 76 y.o. female with a hx of PAF.   She has had some excessive daytime sleepiness as well as snoring.  She underwent sleep study showing mild OSA with an AHI of 11.1/hr but severe during REM sleep with an AHi of 49/hr.  O2 sats were as low as 89%.  She underwent CPAP titration to 6 cm H2O.   She is here today for followup and is doing well.  She denies any chest pain or pressure, SOB, DOE, PND, orthopnea, dizziness, palpitations or syncope. She occasionally has some mild ankle edema at night that resolves by the am.  She is compliant with her meds and is tolerating meds with no SE.     She is doing well with her CPAP device and thinks that she has gotten used to it.  She tolerates the mask and feels the pressure is adequate.  Since going on CPAP she feels rested in the am and has no significant daytime sleepiness.  She denies any significant mouth or nasal dryness or nasal congestion.  She does not think that he snores.     Prior CV studies:   The following studies were reviewed today:  PAP compliance download, EKG  Past Medical History:  Diagnosis Date   Hypertension    Seasonal allergies    Past Surgical History:  Procedure Laterality Date   BREAST EXCISIONAL BIOPSY Left    CHOLECYSTECTOMY  02/2010   KNEE SURGERY     OOPHORECTOMY  01/2003   RSO AT [facility]   VAGINAL HYSTERECTOMY  01/2003   [facility] A&P REPAIR     Current Meds  Medication Sig   benazepril (LOTENSIN) 20 MG tablet Take 1 tablet (20 mg total) by mouth daily.   benazepril-hydrochlorthiazide (LOTENSIN HCT) 20-25 MG tablet Take 1 tablet by mouth daily.   calcium carbonate (OSCAL) 1500 (600 Ca) MG TABS tablet Take by mouth daily with breakfast.    cholecalciferol (VITAMIN D3)  25 MCG (1000 UNIT) tablet Take 1,000 Units by mouth daily.   diltiazem (CARDIZEM CD) 240 MG 24 hr capsule Take 1 capsule (240 mg total) by mouth daily.   ELIQUIS 5 MG TABS tablet TAKE 1 TABLET BY MOUTH TWICE A DAY   fluticasone (FLONASE) 50 MCG/ACT nasal spray Place 1 spray into both nostrils daily as needed.   loratadine (CLARITIN) 10 MG tablet Take 1 tablet by mouth daily as needed.   Multiple Vitamin (MULTIVITAMIN WITH MINERALS) TABS tablet Take 1 tablet by mouth daily.   potassium chloride SA (KLOR-CON M20) 20 MEQ tablet TAKE 1 TABLET BY MOUTH EVERY DAY   pravastatin (PRAVACHOL) 20 MG tablet Take 20 mg by mouth at bedtime.     Allergies:   Codeine   Social History   Tobacco Use   Smoking status: Never   Smokeless tobacco: Never  Substance Use Topics   Alcohol use: Yes    Comment: SELDOM   Drug use: No     Family Hx: The patient's family history includes Breast cancer (age of onset: 41) in her sister; Cancer in her father; Cancer (age of onset: 50) in her brother; Diabetes in her father; Heart disease in her mother; Hypertension in her father.  ROS:  Please see the history of present illness.     All other systems reviewed and are negative.   Labs/Other Tests and Data Reviewed:    Recent Labs: 08/23/2020: B Natriuretic Peptide 55.2; BUN 17; Creatinine, Ser 0.70; Hemoglobin 12.9; Platelets 297; Potassium 3.7; Sodium 137   Recent Lipid Panel No results found for: "CHOL", "TRIG", "HDL", "CHOLHDL", "LDLCALC", "LDLDIRECT"  Wt Readings from Last 3 Encounters:  06/18/21 233 lb 6.4 oz (105.9 kg)  04/24/21 233 lb (105.7 kg)  03/13/21 236 lb (107 kg)     Objective:    Vital Signs:  BP 114/70   Pulse 66   Ht '5\' 5"'$  (1.651 m)   Wt 233 lb 6.4 oz (105.9 kg)   LMP 01/07/2003   SpO2 97%   BMI 38.84 kg/m    GEN: Well nourished, well developed in no acute distress HEENT: Normal NECK: No JVD; No carotid bruits LYMPHATICS: No lymphadenopathy CARDIAC:RRR, no murmurs, rubs,  gallops RESPIRATORY:  Clear to auscultation without rales, wheezing or rhonchi  ABDOMEN: Soft, non-tender, non-distended MUSCULOSKELETAL:  No edema; No deformity  SKIN: Warm and dry NEUROLOGIC:  Alert and oriented x 3 PSYCHIATRIC:  Normal affect    EKG was performed today and demonstrates NSR with nonspecific T wave abnormality ASSESSMENT & PLAN:    1.  OSA - The patient is tolerating PAP therapy well without any problems. The PAP download performed by his DME was personally reviewed and interpreted by me today and showed an AHI of 2.6 /hr on 6 cm H2O with 97 % compliance in using more than 4 hours nightly.  The patient has been using and benefiting from PAP use and will continue to benefit from therapy.     2.  Obesity -I have encouraged her to get into a routine exercise program and cut back on carbs and portions.   3.  HTN -BP is well controlled on exam today -Continue prescription drug management with diltiazem XR 240 mg daily, benazepril HCT 20-25 mg daily and benazepril 20 mg daily with as needed refills  4.  Chronic LE edema -she still has swelling mainly at night which is controlled on diuretics -Continue prescription drug management with hydrochlorothiazide 25 mg daily with as needed refills  5.  PAF -She is maintaining normal sinus rhythm on exam today and only occasionally has a skipped beat -She has not had any bleeding problems on DOAC -I have personally reviewed and interpreted outside labs performed by patient's PCP which showed serum creatinine 0.62, potassium 4 hemoglobin 12.8 on 12/13/2020 -Continue prescription drug management with Cardizem XR 240 mg daily and Eliquis 5 mg twice daily with as needed refills  Medication Adjustments/Labs and Tests Ordered: Current medicines are reviewed at length with the patient today.  Concerns regarding medicines are outlined above.  Tests Ordered: Orders Placed This Encounter  Procedures   EKG 12-Lead    Medication  Changes: No orders of the defined types were placed in this encounter.    Disposition:  Follow up in 1 year(s)  Signed, Fransico Him, MD  06/18/2021 9:05 AM    [name]

## 2021-06-21 DIAGNOSIS — R7303 Prediabetes: Secondary | ICD-10-CM | POA: Diagnosis not present

## 2021-06-21 DIAGNOSIS — I4891 Unspecified atrial fibrillation: Secondary | ICD-10-CM | POA: Diagnosis not present

## 2021-06-21 DIAGNOSIS — D6869 Other thrombophilia: Secondary | ICD-10-CM | POA: Diagnosis not present

## 2021-06-21 DIAGNOSIS — I10 Essential (primary) hypertension: Secondary | ICD-10-CM | POA: Diagnosis not present

## 2021-07-02 ENCOUNTER — Ambulatory Visit
Admission: RE | Admit: 2021-07-02 | Discharge: 2021-07-02 | Disposition: A | Discharge status: Home / Self Care | Payer: Medicare Other | Source: Ambulatory Visit | Attending: Family Medicine | Admitting: Family Medicine

## 2021-07-02 DIAGNOSIS — E2839 Other primary ovarian failure: Secondary | ICD-10-CM

## 2021-07-12 NOTE — Progress Notes (Signed)
[name] [address] [place] [address] Phone: [phone] Subjective:   [name], am serving as a scribe for Dr. Antoine Primas.  I'm seeing this patient by the request  of:  Mila Palmer, MD  CC: left knee pain   WGN:[id number]  04/24/2021 Chronic problem with exacerbation.  Still wants to hold on any type of surgical intervention if possible.  We will see how patient responds.  Discussed icing regimen and home exercises, which activities to do which ones to avoid.  Follow-up with me again 2 to 3 months.  Updated 07/18/2021 Bridget [name] is a 76 y.o. female coming in with complaint of left knee pain. Feels like knee is worse than last time. The exercises hurt the knee so hasn't been doing them Tylenol and topical creams are used to help take off the edge. No other complaints.       Past Medical History:  Diagnosis Date   Hypertension    Seasonal allergies    Past Surgical History:  Procedure Laterality Date   BREAST EXCISIONAL BIOPSY Left    CHOLECYSTECTOMY  02/2010   KNEE SURGERY     OOPHORECTOMY  01/2003   RSO AT [facility]   VAGINAL HYSTERECTOMY  01/2003   [facility] A&P REPAIR   Social History   Socioeconomic History   Marital status: Married    Spouse name: Not on file   Number of children: Not on file   Years of education: Not on file   Highest education level: Not on file  Occupational History   Not on file  Tobacco Use   Smoking status: Never   Smokeless tobacco: Never  Substance and Sexual Activity   Alcohol use: Yes    Comment: SELDOM   Drug use: No   Sexual activity: Not on file    Comment: HYST  Other Topics Concern   Not on file  Social History Narrative   Not on file   Social Determinants of Health   Financial Resource Strain: Not on file  Food Insecurity: Not on file  Transportation Needs: Not on file  Physical Activity: Not on file  Stress: Not on file  Social Connections: Not on file    Allergies  Allergen Reactions   Codeine Nausea Only   Family History  Problem Relation Age of Onset   Heart disease Mother    Diabetes Father        AODM   Hypertension Father    Cancer Father        LUNG   Cancer Brother 74       BLADDER   Breast cancer Sister 39     Current Outpatient Medications (Cardiovascular):    benazepril (LOTENSIN) 20 MG tablet, Take 1 tablet (20 mg total) by mouth daily.   benazepril-hydrochlorthiazide (LOTENSIN HCT) 20-25 MG tablet, Take 1 tablet by mouth daily.   DILT-XR 180 MG 24 hr capsule, Take 180 mg by mouth daily. (Patient not taking: Reported on 06/18/2021)   diltiazem (CARDIZEM CD) 240 MG 24 hr capsule, Take 1 capsule (240 mg total) by mouth daily.   furosemide (LASIX) 20 MG tablet, Take 1 tablet (20 mg total) by mouth as needed. (Patient not taking: Reported on 06/18/2021)   pravastatin (PRAVACHOL) 20 MG tablet, Take 20 mg by mouth at bedtime.  Current Outpatient Medications (Respiratory):    fluticasone (FLONASE) 50 MCG/ACT nasal spray, Place 1 spray into both nostrils daily as needed.   loratadine (CLARITIN) 10  MG tablet, Take 1 tablet by mouth daily as needed.   Current Outpatient Medications (Hematological):    ELIQUIS 5 MG TABS tablet, TAKE 1 TABLET BY MOUTH TWICE A DAY  Current Outpatient Medications (Other):    calcium carbonate (OSCAL) 1500 (600 Ca) MG TABS tablet, Take by mouth daily with breakfast.    cholecalciferol (VITAMIN D3) 25 MCG (1000 UNIT) tablet, Take 1,000 Units by mouth daily.   FAMOTIDINE PO, Take 1 tablet by mouth. 1 tab in the morning 1 tab in the afternoon (Patient not taking: Reported on 06/18/2021)   Multiple Vitamin (MULTIVITAMIN WITH MINERALS) TABS tablet, Take 1 tablet by mouth daily.   potassium chloride SA (KLOR-CON M20) 20 MEQ tablet, TAKE 1 TABLET BY MOUTH EVERY DAY   Reviewed prior external information including notes and imaging from  primary care provider As well as notes that were available from  care everywhere and other healthcare systems.  Past medical history, social, surgical and family history all reviewed in electronic medical record.  No pertanent information unless stated regarding to the chief complaint.   Review of Systems:  No headache, visual changes, nausea, vomiting, diarrhea, constipation, dizziness, abdominal pain, skin rash, fevers, chills, night sweats, weight loss, swollen lymph nodes, body aches, joint swelling, chest pain, shortness of breath, mood changes. POSITIVE muscle aches  Objective  Blood pressure 122/68, pulse 76, height 5\' 5"  (1.651 m), weight 236 lb (107 kg), last menstrual period 01/07/2003, SpO2 96 %.   General: No apparent distress alert and oriented x3 mood and affect normal, dressed appropriately.  HEENT: Pupils equal, extraocular movements intact  Respiratory: Patient's speak in full sentences and does not appear short of breath  Cardiovascular: No lower extremity edema, non tender, no erythema  Antalgic gait  Patient's left knee does have swelling noted.  Patient does have instability with valgus and varus force.  Patient does have limited range of motion lacking last 10 degrees of extension and only 90 degrees of flexion.  After informed written and verbal consent, patient was seated on exam table. Left knee was prepped with alcohol swab and utilizing anterolateral approach, patient's left knee space was injected with 4:1  marcaine 0.5%: Kenalog 40mg /dL. Patient tolerated the procedure well without immediate complications.    Impression and Recommendations:    The above documentation has been reviewed and is accurate and complete Judi Saa, DO

## 2021-07-18 ENCOUNTER — Encounter: Payer: Self-pay | Admitting: Family Medicine

## 2021-07-18 ENCOUNTER — Ambulatory Visit (INDEPENDENT_AMBULATORY_CARE_PROVIDER_SITE_OTHER): Payer: Medicare Other | Admitting: Family Medicine

## 2021-07-18 VITALS — BP 122/68 | HR 76 | Ht 65.0 in | Wt 236.0 lb

## 2021-07-18 DIAGNOSIS — M1712 Unilateral primary osteoarthritis, left knee: Secondary | ICD-10-CM | POA: Diagnosis not present

## 2021-07-18 NOTE — Assessment & Plan Note (Signed)
Patient given another injection but unfortunately the pain that she is having as well as the swelling is causing her to have significant difficulty with at this point her quality of life is decreasing and has failed all conservative therapy.  I do think the patient needs to consider the possibility of surgical intervention especially with the decreased range of motion as well as the instability of the knee.  Patient will be referred.  Discussed the importance of some weight loss before the surgery if possible.

## 2021-07-18 NOTE — Patient Instructions (Signed)
Injected both knees today Dr. Pryor Montes office will call you

## 2021-07-30 ENCOUNTER — Ambulatory Visit: Payer: Medicare Other | Admitting: Family Medicine

## 2021-07-31 ENCOUNTER — Ambulatory Visit (INDEPENDENT_AMBULATORY_CARE_PROVIDER_SITE_OTHER): Payer: Medicare Other | Admitting: Orthopaedic Surgery

## 2021-07-31 VITALS — Ht 65.0 in | Wt 234.0 lb

## 2021-07-31 DIAGNOSIS — M1712 Unilateral primary osteoarthritis, left knee: Secondary | ICD-10-CM | POA: Diagnosis not present

## 2021-07-31 DIAGNOSIS — G8929 Other chronic pain: Secondary | ICD-10-CM

## 2021-07-31 DIAGNOSIS — M25562 Pain in left knee: Secondary | ICD-10-CM | POA: Diagnosis not present

## 2021-07-31 NOTE — Progress Notes (Signed)
The patient is a very pleasant 76 year old female graciously sent to me by Dr. Hulan Saas to evaluate and treat severe arthritis of her left knee.  He has worked with her on her left knee for some time now due to significant arthritis with valgus malalignment.  She has had physical therapy.  She has been through steroid injections and hyaluronic acid injections.  Recently she had an aspiration of her knee with a steroid injection.  Her daughter is with her today as well and she works for [employer] as well.  She says her mom is a fall risk.  She is on Eliquis for A-fib.  She can come off of this for surgery.  She is not a diabetic.  Her BMI is 38.94.  At this point her left knee pain is definitely affecting her mobility, her quality of life, and her actives daily living.  She has tried and failed conservative treatment for over 12 months.  She has no active medical issues and sees physicians regularly.  She currently denies any headache, chest pain, shortness of breath, fever, chills, nausea, vomiting.  X-rays are on the canopy system of her left knee for me to review with her.  The x-rays were reviewed and does show severe end-stage arthritis of her left knee.  There is valgus malalignment.  There is severe patellofemoral disease and osteophytes in all 3 compartments.  Her left knee does have lateral and medial tenderness.  There is valgus malalignment with severe patellofemoral crepitation and pain throughout the arc of motion of her knee.  We talked in length in detail about knee replacement surgery.  I would have her stop Eliquis after her last dose on Monday prior to Friday surgery.  I showed her knee replacement model and talked in length in detail about what the surgery involves.  We discussed the risk and benefits of surgery and what to expect from an intraoperative and postoperative course.  She has been trying to offload that knee using a cane but again the concerns about the pain she is  having and decreasing her mobility with time as the result of dealing with knee pain.  All question concerns were answered and addressed.  We will work on getting her surgery scheduled for sometime in September for a left total knee arthroplasty.

## 2021-08-01 DIAGNOSIS — R300 Dysuria: Secondary | ICD-10-CM | POA: Diagnosis not present

## 2021-09-18 ENCOUNTER — Other Ambulatory Visit: Payer: Self-pay

## 2021-09-26 ENCOUNTER — Other Ambulatory Visit: Payer: Self-pay | Admitting: Family Medicine

## 2021-09-26 DIAGNOSIS — Z1231 Encounter for screening mammogram for malignant neoplasm of breast: Secondary | ICD-10-CM

## 2021-09-27 ENCOUNTER — Telehealth: Payer: Self-pay

## 2021-09-27 NOTE — Telephone Encounter (Signed)
   Pre-operative Risk Assessment    Patient Name: Bridget [name]  DOB: 09/03/1945 MRN: [id number]      Request for Surgical Clearance    Procedure:   LEFT TOTAL KNEE ARTHROPLASTY  Date of Surgery:  Clearance TBD                                 Surgeon:  Jean Rosenthal Surgeon's Group or Practice Name:  [facility] Phone number:  [phone] Fax number:  [phone]   Type of Clearance Requested:   - Medical  - Pharmacy:  Hold Apixaban (Eliquis)     Type of Anesthesia:  Spinal AND BLOCK   Additional requests/questions:    Gwenlyn Found   09/27/2021, 12:30 PM

## 2021-09-30 NOTE — Telephone Encounter (Signed)
   Name: Bridget [name]  DOB: [birth year]  MRN: [id number]  Primary Cardiologist: Fransico Him, MD   Preoperative team, please contact this patient and set up a phone call appointment for further preoperative risk assessment. Please obtain consent and complete medication review. Thank you for your help.  I confirm that guidance regarding antiplatelet and oral anticoagulation therapy has been completed and, if necessary, noted below.  Patient with diagnosis of Afib on Eliquis for anticoagulation.     Procedure: Left TKA Date of procedure: TBD     CHA2DS2-VASc Score = 4  This indicates a 4.8% annual risk of stroke. The patient's score is based upon: CHF History: 0 HTN History: 1 Diabetes History: 0 Stroke History: 0 Vascular Disease History: 0 Age Score: 2 Gender Score: 1     CrCl 81 mL/min using AdjBW Platelet count 297K   Per office protocol, patient can hold Eliquis for 3 days prior to procedure.     Lenna Sciara, NP 09/30/2021, 4:32 PM [place]

## 2021-09-30 NOTE — Telephone Encounter (Signed)
Patient with diagnosis of Afib on Eliquis for anticoagulation.    Procedure: Left TKA Date of procedure: TBD   CHA2DS2-VASc Score = 4  This indicates a 4.8% annual risk of stroke. The patient's score is based upon: CHF History: 0 HTN History: 1 Diabetes History: 0 Stroke History: 0 Vascular Disease History: 0 Age Score: 2 Gender Score: 1    CrCl 81 mL/min using AdjBW Platelet count 297K  Per office protocol, patient can hold Eliquis for 3 days prior to procedure.

## 2021-10-01 ENCOUNTER — Ambulatory Visit
Admission: RE | Admit: 2021-10-01 | Discharge: 2021-10-01 | Disposition: A | Discharge status: Home / Self Care | Payer: Medicare Other | Source: Ambulatory Visit | Attending: Family Medicine | Admitting: Family Medicine

## 2021-10-01 DIAGNOSIS — Z1231 Encounter for screening mammogram for malignant neoplasm of breast: Secondary | ICD-10-CM

## 2021-10-01 NOTE — Telephone Encounter (Signed)
LM to call back.

## 2021-10-03 ENCOUNTER — Other Ambulatory Visit: Payer: Self-pay | Admitting: Physician Assistant

## 2021-10-03 DIAGNOSIS — Z23 Encounter for immunization: Secondary | ICD-10-CM | POA: Diagnosis not present

## 2021-10-04 NOTE — Progress Notes (Signed)
COVID Vaccine received:  '[]'$  No '[x]'$  Yes Date of any COVID positive Test in last 90 days: None  PCP - Jonathon Jordan, MD Cardiologist - Fransico Him MD   Clearance phone call will be on 10-16-2021  Chest x-ray - n/a EKG -  06-18-2021  Epic Stress Test - n/a ECHO - 06-11-2018  Epic Cardiac Cath - n/a  Pacemaker/ICD device     '[x]'$  N/A Spinal Cord Stimulator:'[x]'$  No '[]'$  Yes      (Remind patient to bring remote DOS) Other Implants:   History of Sleep Apnea? '[]'$  No '[x]'$  Yes   Sleep Study Date:  2020 CPAP used?- '[]'$  No '[x]'$  Yes  (Instruct to bring their mask & Tubing)  Does the patient monitor blood sugar? '[]'$  No '[]'$  Yes  '[x]'$  N/A  Blood Thinner Instructions: Eliquis bid Instructions:  Hold x 3 days ok'd 09-27-21 Epic note    Last Dose: Monday 10-14-2021  ERAS Protocol Ordered: '[]'$  No  '[x]'$  Yes PRE-SURGERY '[x]'$  ENSURE  '[]'$  G2   Comments:   Activity level: Patient can climb a flight of stairs without difficulty;  '[x]'$  No CP  '[x]'$  No SOB,  but would have __knee pain____   Anesthesia review: A.fib, HTN  Patient denies shortness of breath, fever, cough and chest pain at PAT appointment.  Patient verbalized understanding and agreement to the Pre-Surgical Instructions that were given to them at this PAT appointment. Patient was also educated of the need to review these PAT instructions again prior to his/her surgery.I reviewed the appropriate phone numbers to call if they have any and questions or concerns.

## 2021-10-05 NOTE — Patient Instructions (Signed)
SURGICAL WAITING ROOM VISITATION Patients having surgery or a procedure may have no more than 2 support people in the waiting area - these visitors may rotate in the visitor waiting room.   Children under the age of 46 must have an adult with them who is not the patient. If the patient needs to stay at the hospital during part of their recovery, the visitor guidelines for inpatient rooms apply.  PRE-OP VISITATION  Pre-op nurse will coordinate an appropriate time for 1 support person to accompany the patient in pre-op.  This support person may not rotate.  This visitor will be contacted when the time is appropriate for the visitor to come back in the pre-op area.  Please refer to the [facility] website for the visitor guidelines for Inpatients (after your surgery is over and you are in a regular room).  You are not required to quarantine at this time prior to your surgery. However, you must do this: Hand Hygiene often Do NOT share personal items Notify your provider if you are in close contact with someone who has COVID or you develop fever 100.4 or greater, new onset of sneezing, cough, sore throat, shortness of breath or body aches.   If you received a COVID test during your pre-op visit  it is requested that you wear a mask when out in public, stay away from anyone that may not be feeling well and notify your surgeon if you develop symptoms. If you test positive for Covid or have been in contact with anyone that has tested positive in the last 10 days please notify you surgeon.       Your procedure is scheduled on  Friday  October 18, 2021  Report to [facility] Main Entrance.  Report to admitting at:  05:15   AM  +++++Call this number if you have any questions or problems the morning of surgery [phone]  Do not eat food :After Midnight the night prior to your surgery/procedure.  After Midnight you may have the following liquids until   04:15  AMDAY OF SURGERY  Clear  Liquid Diet Water Black Coffee (sugar ok, NO MILK/CREAM OR CREAMERS)  Tea (sugar ok, NO MILK/CREAM OR CREAMERS) regular and decaf                             Plain Jell-O (NO RED)                                           Fruit ices (not with fruit pulp, NO RED)                                     Popsicles (NO RED)                                                                  Juice: apple, WHITE grape, WHITE cranberry Sports drinks like Gatorade (NO RED)  The day of surgery:  Drink ONE (1) Pre-Surgery Clear Ensure  at   04:15 AM the morning of surgery. Drink in one sitting. Do not sip.  This drink was given to you during your hospital pre-op appointment visit. Nothing else to drink after completing the Pre-Surgery Clear Ensure  : No candy, chewing gum or throat lozenges.    FOLLOW ANY ADDITIONAL PRE OP INSTRUCTIONS YOU RECEIVED FROM YOUR SURGEON'S OFFICE!!!   Oral Hygiene is also important to reduce your risk of infection.        Remember - BRUSH YOUR TEETH THE MORNING OF SURGERY WITH YOUR REGULAR TOOTHPASTE  Do NOT smoke after Midnight the night before surgery.  ELIQUIS - Last Dose will be on Monday 10-14-2021. Don't take anymore until after your surgery. You will be instructed on when to resume your Eliquis by your doctor   Take ONLY these medicines the morning of surgery with A SIP OF WATER: Diltiazem (Cardizem) and you may take famotidine (Pepcid) if needed  Bring CPAP mask and tubing day of surgery.                   You may not have any metal on your body including hair pins, jewelry, and body piercing  Do not wear make-up, lotions, powders, perfumes or deodorant  Do not wear nail polish including gel and S&S, artificial / acrylic nails, or any other type of covering on natural nails including finger and toenails. If you have artificial nails, gel coating, etc., that needs to be removed by a nail salon, Please have this removed prior to surgery. Not  doing so may mean that your surgery could be cancelled or delayed if the Surgeon or anesthesia staff feels like they are unable to monitor you safely.   Do not shave 48 hours prior to surgery to avoid nicks in your skin which may contribute to postoperative infections.   Contacts, Hearing Aids, dentures or bridgework may not be worn into surgery.   You may bring a small overnight bag with you on the day of surgery, only pack items that are not valuable .[facility] IS NOT RESPONSIBLE   FOR VALUABLES THAT ARE LOST OR STOLEN.   DO NOT [place]. PHARMACY WILL DISPENSE MEDICATIONS LISTED ON YOUR MEDICATION LIST TO YOU DURING YOUR ADMISSION [place]!   Special Instructions: Bring a copy of your healthcare power of attorney and living will documents the day of surgery, if you wish to have them scanned into your [facility] Records- EPIC  Please read over the following fact sheets you were given: IF YOU HAVE QUESTIONS ABOUT YOUR PRE-OP INSTRUCTIONS, PLEASE CALL [phone]  ([place])   [place] - Preparing for Surgery Before surgery, you can play an important role.  Because skin is not sterile, your skin needs to be as free of germs as possible.  You can reduce the number of germs on your skin by washing with CHG (chlorahexidine gluconate) soap before surgery.  CHG is an antiseptic cleaner which kills germs and bonds with the skin to continue killing germs even after washing. Please DO NOT use if you have an allergy to CHG or antibacterial soaps.  If your skin becomes reddened/irritated stop using the CHG and inform your nurse when you arrive at Short Stay. Do not shave (including legs and underarms) for at least 48 hours prior to the first CHG shower.  You may shave your face/neck.  Please follow  these instructions carefully:  1.  Shower with CHG Soap the night before surgery and the  morning of surgery.  2.  If you choose to wash your hair, wash your  hair first as usual with your normal  shampoo.  3.  After you shampoo, rinse your hair and body thoroughly to remove the shampoo.                             4.  Use CHG as you would any other liquid soap.  You can apply chg directly to the skin and wash.  Gently with a scrungie or clean washcloth.  5.  Apply the CHG Soap to your body ONLY FROM THE NECK DOWN.   Do not use on face/ open                           Wound or open sores. Avoid contact with eyes, ears mouth and genitals (private parts).                       Wash face,  Genitals (private parts) with your normal soap.             6.  Wash thoroughly, paying special attention to the area where your  surgery  will be performed.  7.  Thoroughly rinse your body with warm water from the neck down.  8.  DO NOT shower/wash with your normal soap after using and rinsing off the CHG Soap.            9.  Pat yourself dry with a clean towel.            10.  Wear clean pajamas.            11.  Place clean sheets on your bed the night of your first shower and do not  sleep with pets.  ON THE DAY OF SURGERY : Do not apply any lotions/deodorants the morning of surgery.  Please wear clean clothes to the hospital/surgery center.    FAILURE TO FOLLOW THESE INSTRUCTIONS MAY RESULT IN THE CANCELLATION OF YOUR SURGERY  PATIENT SIGNATURE_________________________________  NURSE SIGNATURE__________________________________  ________________________________________________________________________        Bridget [name]    An incentive spirometer is a tool that can help keep your lungs clear and active. This tool measures how well you are filling your lungs with each breath. Taking long deep breaths may help reverse or decrease the chance of developing breathing (pulmonary) problems (especially infection) following: A long period of time when you are unable to move or be active. BEFORE THE PROCEDURE  If the spirometer includes an indicator to  show your best effort, your nurse or respiratory therapist will set it to a desired goal. If possible, sit up straight or lean slightly forward. Try not to slouch. Hold the incentive spirometer in an upright position. INSTRUCTIONS FOR USE  Sit on the edge of your bed if possible, or sit up as far as you can in bed or on a chair. Hold the incentive spirometer in an upright position. Breathe out normally. Place the mouthpiece in your mouth and seal your lips tightly around it. Breathe in slowly and as deeply as possible, raising the piston or the ball toward the top of the column. Hold your breath for 3-5 seconds or for as long as possible. Allow the piston or ball  to fall to the bottom of the column. Remove the mouthpiece from your mouth and breathe out normally. Rest for a few seconds and repeat Steps 1 through 7 at least 10 times every 1-2 hours when you are awake. Take your time and take a few normal breaths between deep breaths. The spirometer may include an indicator to show your best effort. Use the indicator as a goal to work toward during each repetition. After each set of 10 deep breaths, practice coughing to be sure your lungs are clear. If you have an incision (the cut made at the time of surgery), support your incision when coughing by placing a pillow or rolled up towels firmly against it. Once you are able to get out of bed, walk around indoors and cough well. You may stop using the incentive spirometer when instructed by your caregiver.  RISKS AND COMPLICATIONS Take your time so you do not get dizzy or light-headed. If you are in pain, you may need to take or ask for pain medication before doing incentive spirometry. It is harder to take a deep breath if you are having pain. AFTER USE Rest and breathe slowly and easily. It can be helpful to keep track of a log of your progress. Your caregiver can provide you with a simple table to help with this. If you are using the spirometer at  home, follow these instructions: [place] IF:  You are having difficultly using the spirometer. You have trouble using the spirometer as often as instructed. Your pain medication is not giving enough relief while using the spirometer. You develop fever of 100.5 F (38.1 C) or higher.                                                                                                    SEEK IMMEDIATE MEDICAL CARE IF:  You cough up bloody sputum that had not been present before. You develop fever of 102 F (38.9 C) or greater. You develop worsening pain at or near the incision site. MAKE SURE YOU:  Understand these instructions. Will watch your condition. Will get help right away if you are not doing well or get worse. Document Released: 05/05/2006 Document Revised: 03/17/2011 Document Reviewed: 07/06/2006 [facility] Patient Information 2014 [place], [place].

## 2021-10-07 ENCOUNTER — Telehealth: Payer: Self-pay | Admitting: *Deleted

## 2021-10-07 ENCOUNTER — Other Ambulatory Visit: Payer: Self-pay

## 2021-10-07 ENCOUNTER — Encounter (HOSPITAL_COMMUNITY)
Admission: RE | Admit: 2021-10-07 | Discharge: 2021-10-07 | Disposition: A | Discharge status: Home / Self Care | Payer: Medicare Other | Source: Ambulatory Visit | Attending: Orthopaedic Surgery | Admitting: Orthopaedic Surgery

## 2021-10-07 ENCOUNTER — Encounter (HOSPITAL_COMMUNITY): Payer: Self-pay

## 2021-10-07 VITALS — BP 139/66 | HR 62 | Temp 97.9°F | Resp 18 | Ht 64.0 in | Wt 228.0 lb

## 2021-10-07 DIAGNOSIS — Z01818 Encounter for other preprocedural examination: Secondary | ICD-10-CM

## 2021-10-07 DIAGNOSIS — G4730 Sleep apnea, unspecified: Secondary | ICD-10-CM | POA: Insufficient documentation

## 2021-10-07 DIAGNOSIS — I10 Essential (primary) hypertension: Secondary | ICD-10-CM | POA: Diagnosis not present

## 2021-10-07 DIAGNOSIS — Z7901 Long term (current) use of anticoagulants: Secondary | ICD-10-CM | POA: Insufficient documentation

## 2021-10-07 DIAGNOSIS — M1712 Unilateral primary osteoarthritis, left knee: Secondary | ICD-10-CM | POA: Insufficient documentation

## 2021-10-07 DIAGNOSIS — I4891 Unspecified atrial fibrillation: Secondary | ICD-10-CM | POA: Diagnosis not present

## 2021-10-07 DIAGNOSIS — Z01812 Encounter for preprocedural laboratory examination: Secondary | ICD-10-CM | POA: Diagnosis present

## 2021-10-07 HISTORY — DX: Unspecified osteoarthritis, unspecified site: M19.90

## 2021-10-07 HISTORY — DX: Sleep apnea, unspecified: G47.30

## 2021-10-07 HISTORY — DX: Malignant (primary) neoplasm, unspecified: C80.1

## 2021-10-07 LAB — SURGICAL PCR SCREEN
MRSA, PCR: NEGATIVE
Staphylococcus aureus: NEGATIVE

## 2021-10-07 LAB — CBC
HCT: 42.3 % (ref 36.0–46.0)
Hemoglobin: 13.6 g/dL (ref 12.0–15.0)
MCH: 27.1 pg (ref 26.0–34.0)
MCHC: 32.2 g/dL (ref 30.0–36.0)
MCV: 84.3 fL (ref 80.0–100.0)
Platelets: 324 10*3/uL (ref 150–400)
RBC: 5.02 MIL/uL (ref 3.87–5.11)
RDW: 14.6 % (ref 11.5–15.5)
WBC: 5.9 10*3/uL (ref 4.0–10.5)
nRBC: 0 % (ref 0.0–0.2)

## 2021-10-07 LAB — BASIC METABOLIC PANEL
Anion gap: 7 (ref 5–15)
BUN: 15 mg/dL (ref 8–23)
CO2: 25 mmol/L (ref 22–32)
Calcium: 9.7 mg/dL (ref 8.9–10.3)
Chloride: 105 mmol/L (ref 98–111)
Creatinine, Ser: 0.58 mg/dL (ref 0.44–1.00)
GFR, Estimated: >60 mL/min (ref >60)
Glucose, Bld: 109 mg/dL — ABNORMAL HIGH (ref 70–99)
Potassium: 4.1 mmol/L (ref 3.5–5.1)
Sodium: 137 mmol/L (ref 135–145)

## 2021-10-07 NOTE — Telephone Encounter (Signed)
Pt has been scheduled for a tele visit, 10/09/21 2:40.  Consent on file / medications reconciled.

## 2021-10-07 NOTE — Telephone Encounter (Signed)
Pt has been scheduled for a tele visit, 10/09/21 2:40.  Consent on file / medications reconciled.    Patient Consent for Virtual Visit        Bridget [name] has provided verbal consent on 10/07/2021 for a virtual visit (video or telephone).   CONSENT FOR VIRTUAL VISIT FOR:  Bridget [name]  By participating in this virtual visit I agree to the following:  I hereby voluntarily request, consent and authorize [facility] and its employed or contracted physicians, physician assistants, nurse practitioners or other licensed health care professionals (the Practitioner), to provide me with telemedicine health care services (the "Services") as deemed necessary by the treating Practitioner. I acknowledge and consent to receive the Services by the Practitioner via telemedicine. I understand that the telemedicine visit will involve communicating with the Practitioner through live audiovisual communication technology and the disclosure of certain medical information by electronic transmission. I acknowledge that I have been given the opportunity to request an in-person assessment or other available alternative prior to the telemedicine visit and am voluntarily participating in the telemedicine visit.  I understand that I have the right to withhold or withdraw my consent to the use of telemedicine in the course of my care at any time, without affecting my right to future care or treatment, and that the Practitioner or I may terminate the telemedicine visit at any time. I understand that I have the right to inspect all information obtained and/or recorded in the course of the telemedicine visit and may receive copies of available information for a reasonable fee.  I understand that some of the potential risks of receiving the Services via telemedicine include:  Delay or interruption in medical evaluation due to technological equipment failure or disruption; Information transmitted may not be  sufficient (e.g. poor resolution of images) to allow for appropriate medical decision making by the Practitioner; and/or  In rare instances, security protocols could fail, causing a breach of personal health information.  Furthermore, I acknowledge that it is my responsibility to provide information about my medical history, conditions and care that is complete and accurate to the best of my ability. I acknowledge that Practitioner's advice, recommendations, and/or decision may be based on factors not within their control, such as incomplete or inaccurate data provided by me or distortions of diagnostic images or specimens that may result from electronic transmissions. I understand that the practice of medicine is not an exact science and that Practitioner makes no warranties or guarantees regarding treatment outcomes. I acknowledge that a copy of this consent can be made available to me via my patient portal ([place]), or I can request a printed copy by calling the office of [place].    I understand that my insurance will be billed for this visit.   I have read or had this consent read to me. I understand the contents of this consent, which adequately explains the benefits and risks of the Services being provided via telemedicine.  I have been provided ample opportunity to ask questions regarding this consent and the Services and have had my questions answered to my satisfaction. I give my informed consent for the services to be provided through the use of telemedicine in my medical care

## 2021-10-09 ENCOUNTER — Encounter: Payer: Self-pay | Admitting: Nurse Practitioner

## 2021-10-09 ENCOUNTER — Ambulatory Visit: Payer: Medicare Other | Attending: Internal Medicine | Admitting: Nurse Practitioner

## 2021-10-09 DIAGNOSIS — Z01810 Encounter for preprocedural cardiovascular examination: Secondary | ICD-10-CM | POA: Diagnosis not present

## 2021-10-09 NOTE — Progress Notes (Signed)
Virtual Visit via Telephone Note   Because of Bridget [name]'s co-morbid illnesses, she is at least at moderate risk for complications without adequate follow up.  This format is felt to be most appropriate for this patient at this time.  The patient did not have access to video technology/had technical difficulties with video requiring transitioning to audio format only (telephone).  All issues noted in this document were discussed and addressed.  No physical exam could be performed with this format.  Please refer to the patient's chart for her consent to telehealth for [facility].  Evaluation Performed:  Preoperative cardiovascular risk assessment _____________   Date:  10/09/2021   Patient ID:  Bridget [name], DOB 1945-07-15, MRN [id number] Patient Location:  Home Provider location:   Office  Primary Care Provider:  Jonathon Jordan, MD Primary Cardiologist:  Fransico Him, MD  Chief Complaint / Patient Profile   76 y.o. y/o female with a h/o OSA on PAP, HTN, obesity, chronic LE edema, PAF on chronic anticoagulation, hypokalemia who is pending left total knee arthroplasty and presents today for telephonic preoperative cardiovascular risk assessment.  Past Medical History    Past Medical History:  Diagnosis Date   Arthritis    Cancer ([place])    basal cell cancer removed on face   Hypertension    Seasonal allergies    Sleep apnea    Past Surgical History:  Procedure Laterality Date   BREAST EXCISIONAL BIOPSY Left    CHOLECYSTECTOMY  02/2010   KNEE SURGERY Right    Meniscal tear   OOPHORECTOMY  01/2003   RSO AT [facility]   VAGINAL HYSTERECTOMY  01/2003   [facility],RSO A&P REPAIR   WISDOM TOOTH EXTRACTION      Allergies  Allergies  Allergen Reactions   Codeine Nausea Only    History of Present Illness    Bridget [name] is a 76 y.o. female who presents via audio/video conferencing for a telehealth visit today.  Pt was last seen in cardiology clinic on  06/18/21 by Dr. Radford Pax.  At that time Bridget [name] was doing well.  The patient is now pending procedure as outlined above. Since her last visit, she  denies chest pain, shortness of breath, fatigue, palpitations, melena, hematuria, hemoptysis, diaphoresis, weakness, presyncope, syncope, orthopnea, and PND. She reports bilateral mild ankle edema, feels it is 2/2 to taking diltiazem. Has been active recently taking care of grandchildren and unpacking and moving boxes in her home.   Home Medications    Prior to Admission medications   Medication Sig Start Date End Date Taking? Authorizing Provider  benazepril (LOTENSIN) 20 MG tablet Take 20 mg by mouth daily as needed (High Blood Pressure).    [provider]  benazepril-hydrochlorthiazide (LOTENSIN HCT) 20-25 MG tablet Take 1 tablet by mouth daily. 12/05/20   Sueanne Margarita, MD  calcium carbonate (OSCAL) 1500 (600 Ca) MG TABS tablet Take 600 mg of elemental calcium by mouth daily with breakfast.    [provider]  cholecalciferol (VITAMIN D3) 25 MCG (1000 UNIT) tablet Take 1,000 Units by mouth daily.    [provider]  diltiazem (CARDIZEM CD) 240 MG 24 hr capsule Take 1 capsule (240 mg total) by mouth daily. 12/05/20   Sueanne Margarita, MD  ELIQUIS 5 MG TABS tablet TAKE 1 TABLET BY MOUTH TWICE A DAY 12/17/20   Sueanne Margarita, MD  famotidine (PEPCID) 20 MG tablet Take 20 mg by mouth daily as needed (acid  reflux).    [provider]  fluticasone (FLONASE) 50 MCG/ACT nasal spray Place 1 spray into both nostrils daily as needed for allergies.    [provider]  loratadine (CLARITIN) 10 MG tablet Take 1 tablet by mouth daily as needed for allergies.    [provider]  Multiple Vitamin (MULTIVITAMIN WITH MINERALS) TABS tablet Take 1 tablet by mouth daily.    [provider]  potassium chloride SA (KLOR-CON M20) 20 MEQ tablet TAKE 1 TABLET BY MOUTH EVERY DAY 12/17/20   Sueanne Margarita,  MD  pravastatin (PRAVACHOL) 20 MG tablet Take 20 mg by mouth at bedtime. 04/03/15   [provider]    Physical Exam    Vital Signs:  Bridget [name] does not have vital signs available for review today.  Given telephonic nature of communication, physical exam is limited. AAOx3. NAD. Normal affect.  Speech and respirations are unlabored.  Accessory Clinical Findings    None  Assessment & Plan    1.  Preoperative Cardiovascular Risk Assessment: The patient is doing well from a cardiac perspective. Therefore, based on ACC/AHA guidelines, the patient would be at acceptable risk for the planned procedure without further cardiovascular testing. According to the Revised Cardiac Risk Index (RCRI), her Perioperative Risk of Major Cardiac Event is (%): 0.9 Her Functional Capacity in METs is: 6.91 according to the Duke Activity Status Index (DASI).  The patient was advised that if she develops new symptoms prior to surgery to contact our office to arrange for a follow-up visit, and she verbalized understanding.  Per office protocol, patient can hold Eliquis for 3 days prior to procedure.     A copy of this note will be routed to requesting surgeon.  Time:   Today, I have spent 7 minutes with the patient with telehealth technology discussing medical history, symptoms, and management plan.    Emmaline Life, NP-C  10/09/2021, 2:41 PM [address], [address] Office [phone] Fax [phone]

## 2021-10-10 ENCOUNTER — Other Ambulatory Visit: Payer: Self-pay | Admitting: Cardiology

## 2021-10-10 NOTE — Progress Notes (Signed)
Anesthesia Chart Review   Case: [id number] Date/Time: 10/18/21 0700   Procedure: LEFT TOTAL KNEE ARTHROPLASTY (Left: Knee)   Anesthesia type: Choice   Pre-op diagnosis: OSTEOARTHRITIS / DEGENERATIVE JOINT DISEASE LEFT KNEE   Location: [place] 09 / [facility] ORS   Surgeons: Mcarthur Rossetti, MD       DISCUSSION:76 y.o. never smoker with h/o HTN, sleep apnea, a-fib (on Eliquis), left knee OA scheduled for above procedure 10/18/2021 with Dr. Jean Rosenthal.   Pt last seen by cardiology 10/09/2021. Per OV note, "Preoperative Cardiovascular Risk Assessment: The patient is doing well from a cardiac perspective. Therefore, based on ACC/AHA guidelines, the patient would be at acceptable risk for the planned procedure without further cardiovascular testing. According to the Revised Cardiac Risk Index (RCRI), her Perioperative Risk of Major Cardiac Event is (%): 0.9 Her Functional Capacity in METs is: 6.91 according to the Duke Activity Status Index (DASI).   The patient was advised that if she develops new symptoms prior to surgery to contact our office to arrange for a follow-up visit, and she verbalized understanding.   Per office protocol, patient can hold Eliquis for 3 days prior to procedure."  Anticipate pt can proceed with planned procedure barring acute status change.   VS: BP 139/66   Pulse 62   Temp 36.6 C (Oral)   Resp 18   Ht '5\' 4"'$  (1.626 m)   Wt 103.4 kg   LMP 01/07/2003   BMI 39.14 kg/m   PROVIDERS: Jonathon Jordan, MD is PCP   Primary Cardiologist:  Fransico Him, MD LABS: Labs reviewed: Acceptable for surgery. (all labs ordered are listed, but only abnormal results are displayed)  Labs Reviewed  BASIC METABOLIC PANEL - Abnormal; Notable for the following components:      Result Value   Glucose, Bld 109 (*)    All other components within normal limits  SURGICAL PCR SCREEN  CBC     IMAGES:   EKG:   CV: Echo 06/10/2018 1. The left ventricle has normal  systolic function with an ejection  fraction of 60-65%. The cavity size was normal. Left ventricular diastolic  Doppler parameters are indeterminate. No evidence of left ventricular  regional wall motion abnormalities.   2. The right ventricle has normal systolic function. The cavity was  normal. There is no increase in right ventricular wall thickness.   3. No evidence of mitral valve stenosis.   4. The aortic valve is tricuspid. No stenosis of the aortic valve.   5. The aortic root, ascending aorta and aortic arch are normal in size  and structure.  Past Medical History:  Diagnosis Date   Arthritis    Cancer ([place])    basal cell cancer removed on face   Hypertension    Seasonal allergies    Sleep apnea     Past Surgical History:  Procedure Laterality Date   BREAST EXCISIONAL BIOPSY Left    CHOLECYSTECTOMY  02/2010   KNEE SURGERY Right    Meniscal tear   OOPHORECTOMY  01/2003   [facility]   VAGINAL HYSTERECTOMY  01/2003   TV[facility],RSO A&P REPAIR   WISDOM TOOTH EXTRACTION      MEDICATIONS:  benazepril (LOTENSIN) 20 MG tablet   benazepril-hydrochlorthiazide (LOTENSIN HCT) 20-25 MG tablet   calcium carbonate (OSCAL) 1500 (600 Ca) MG TABS tablet   cholecalciferol (VITAMIN D3) 25 MCG (1000 UNIT) tablet   diltiazem (CARDIZEM CD) 240 MG 24 hr capsule   ELIQUIS 5 MG TABS tablet  famotidine (PEPCID) 20 MG tablet   fluticasone (FLONASE) 50 MCG/ACT nasal spray   loratadine (CLARITIN) 10 MG tablet   Multiple Vitamin (MULTIVITAMIN WITH MINERALS) TABS tablet   potassium chloride SA (KLOR-CON M20) 20 MEQ tablet   pravastatin (PRAVACHOL) 20 MG tablet   No current facility-administered medications for this encounter.    Konrad Felix Ward, PA-C [facility] Pre-Surgical Testing [phone]

## 2021-10-10 NOTE — Telephone Encounter (Signed)
Prescription refill request for Eliquis received. Indication:Afib Last office visit:10/23 Scr:0.5 Age: 76 Weight:103.4 kg  Prescription refilled

## 2021-10-10 NOTE — Anesthesia Preprocedure Evaluation (Addendum)
Anesthesia Evaluation  Patient identified by MRN, date of birth, ID band Patient awake    Reviewed: Allergy & Precautions, NPO status , Patient's Chart, lab work & pertinent test results  Airway Mallampati: III  TM Distance: >3 FB Neck ROM: Full    Dental no notable dental hx.    Pulmonary sleep apnea and Continuous Positive Airway Pressure Ventilation ,    Pulmonary exam normal        Cardiovascular hypertension, Pt. on medications Normal cardiovascular exam+ dysrhythmias Atrial Fibrillation      Neuro/Psych  Neuromuscular disease negative psych ROS   GI/Hepatic negative GI ROS, Neg liver ROS,   Endo/Other  negative endocrine ROS  Renal/GU negative Renal ROS     Musculoskeletal  (+) Arthritis ,   Abdominal (+) + obese,   Peds  Hematology  (+) Blood dyscrasia (on Eliquis ), ,   Anesthesia Other Findings OSTEOARTHRITIS DEGENERATIVE JOINT DISEASE LEFT KNEE  Reproductive/Obstetrics                           Anesthesia Physical Anesthesia Plan  ASA: 3  Anesthesia Plan: Spinal and Regional   Post-op Pain Management: Regional block*   Induction: Intravenous  PONV Risk Score and Plan: 2 and Ondansetron, Dexamethasone, Propofol infusion, Treatment may vary due to age or medical condition and Midazolam  Airway Management Planned: Simple Face Mask  Additional Equipment:   Intra-op Plan:   Post-operative Plan:   Informed Consent: I have reviewed the patients History and Physical, chart, labs and discussed the procedure including the risks, benefits and alternatives for the proposed anesthesia with the patient or authorized representative who has indicated his/her understanding and acceptance.     Dental advisory given  Plan Discussed with: CRNA  Anesthesia Plan Comments: (PAT note 10/07/2021)      Anesthesia Quick Evaluation

## 2021-10-17 ENCOUNTER — Telehealth: Payer: Self-pay | Admitting: *Deleted

## 2021-10-17 ENCOUNTER — Other Ambulatory Visit: Payer: Self-pay | Admitting: *Deleted

## 2021-10-17 DIAGNOSIS — M1712 Unilateral primary osteoarthritis, left knee: Secondary | ICD-10-CM

## 2021-10-17 NOTE — H&P (Signed)
TOTAL KNEE ADMISSION H&P  Patient is being admitted for left total knee arthroplasty.  Subjective:  Chief Complaint:left knee pain.  HPI: Bridget [name], 76 y.o. female, has a history of pain and functional disability in the left knee due to arthritis and has failed non-surgical conservative treatments for greater than 12 weeks to includeNSAID's and/or analgesics, corticosteriod injections, viscosupplementation injections, flexibility and strengthening excercises, use of assistive devices, weight reduction as appropriate, and activity modification.  Onset of symptoms was gradual, starting 3 years ago with gradually worsening course since that time. The patient noted no past surgery on the left knee(s).  Patient currently rates pain in the left knee(s) at 10 out of 10 with activity. Patient has night pain, worsening of pain with activity and weight bearing, pain that interferes with activities of daily living, pain with passive range of motion, crepitus, and joint swelling.  Patient has evidence of subchondral sclerosis, periarticular osteophytes, and joint space narrowing by imaging studies. There is no active infection.  Patient Active Problem List   Diagnosis Date Noted   Unilateral primary osteoarthritis, left knee 07/31/2021   Degenerative arthritis of left knee 11/24/2019   Secondary hypercoagulable state ([place]) 01/26/2019   Greater trochanteric bursitis of right hip 09/23/2018   Lumbar radiculopathy 08/12/2018   Gluteal tendonitis of right buttock 07/29/2018   Atrial fibrillation ([place]) 07/21/2018   Arthritis of left acromioclavicular joint 04/26/2018   Partial nontraumatic tear of left rotator cuff 04/26/2018   Hypertension    Seasonal allergies    Past Medical History:  Diagnosis Date   Arthritis    Cancer ([place])    basal cell cancer removed on face   Hypertension    Seasonal allergies    Sleep apnea     Past Surgical History:  Procedure Laterality Date   BREAST EXCISIONAL  BIOPSY Left    CHOLECYSTECTOMY  02/2010   KNEE SURGERY Right    Meniscal tear   OOPHORECTOMY  01/2003   [facility]O AT [facility]   VAGINAL HYSTERECTOMY  01/2003   [facility],RSO A&P REPAIR   WISDOM TOOTH EXTRACTION      No current facility-administered medications for this encounter.   Current Outpatient Medications  Medication Sig Dispense Refill Last Dose   benazepril-hydrochlorthiazide (LOTENSIN HCT) 20-25 MG tablet Take 1 tablet by mouth daily. 90 tablet 3    calcium carbonate (OSCAL) 1500 (600 Ca) MG TABS tablet Take 600 mg of elemental calcium by mouth daily with breakfast.      cholecalciferol (VITAMIN D3) 25 MCG (1000 UNIT) tablet Take 1,000 Units by mouth daily.      famotidine (PEPCID) 20 MG tablet Take 20 mg by mouth daily as needed (acid reflux).      fluticasone (FLONASE) 50 MCG/ACT nasal spray Place 1 spray into both nostrils daily as needed for allergies.      loratadine (CLARITIN) 10 MG tablet Take 1 tablet by mouth daily as needed for allergies.      Multiple Vitamin (MULTIVITAMIN WITH MINERALS) TABS tablet Take 1 tablet by mouth daily.      potassium chloride SA (KLOR-CON M20) 20 MEQ tablet TAKE 1 TABLET BY MOUTH EVERY DAY 90 tablet 3    pravastatin (PRAVACHOL) 20 MG tablet Take 20 mg by mouth at bedtime.      benazepril (LOTENSIN) 20 MG tablet TAKE 1 TABLET BY MOUTH EVERY DAY 90 tablet 3    diltiazem (CARDIZEM CD) 240 MG 24 hr capsule TAKE 1 CAPSULE BY MOUTH EVERY DAY 90 capsule 3  ELIQUIS 5 MG TABS tablet TAKE 1 TABLET BY MOUTH TWICE A DAY 180 tablet 1    Allergies  Allergen Reactions   Codeine Nausea Only    Social History   Tobacco Use   Smoking status: Never   Smokeless tobacco: Never  Substance Use Topics   Alcohol use: Yes    Comment: SELDOM    Family History  Problem Relation Age of Onset   Heart disease Mother    Diabetes Father        AODM   Hypertension Father    Cancer Father        LUNG   Cancer Brother 10       BLADDER   Breast cancer Sister 15      Review of Systems  Musculoskeletal:  Positive for gait problem and joint swelling.  All other systems reviewed and are negative.   Objective:  Physical Exam Vitals reviewed.  Constitutional:      Appearance: Normal appearance.  HENT:     Head: Normocephalic and atraumatic.  Eyes:     Extraocular Movements: Extraocular movements intact.     Pupils: Pupils are equal, round, and reactive to light.  Cardiovascular:     Rate and Rhythm: Normal rate. Rhythm irregular.     Pulses: Normal pulses.  Pulmonary:     Effort: Pulmonary effort is normal.     Breath sounds: Normal breath sounds.  Abdominal:     Palpations: Abdomen is soft.  Musculoskeletal:     Cervical back: Normal range of motion and neck supple.     Left knee: Effusion, bony tenderness and crepitus present. Decreased range of motion. Tenderness present over the medial joint line and lateral joint line. Abnormal alignment.  Neurological:     Mental Status: She is alert and oriented to person, place, and time.  Psychiatric:        Behavior: Behavior normal.     Vital signs in last 24 hours:    Labs:   Estimated body mass index is 39.14 kg/m as calculated from the following:   Height as of 10/07/21: '5\' 4"'$  (1.626 m).   Weight as of 10/07/21: 103.4 kg.   Imaging Review Plain radiographs demonstrate severe degenerative joint disease of the left knee(s). The overall alignment ismild valgus. The bone quality appears to be good for age and reported activity level.      Assessment/Plan:  End stage arthritis, left knee   The patient history, physical examination, clinical judgment of the provider and imaging studies are consistent with end stage degenerative joint disease of the left knee(s) and total knee arthroplasty is deemed medically necessary. The treatment options including medical management, injection therapy arthroscopy and arthroplasty were discussed at length. The risks and benefits of total knee  arthroplasty were presented and reviewed. The risks due to aseptic loosening, infection, stiffness, patella tracking problems, thromboembolic complications and other imponderables were discussed. The patient acknowledged the explanation, agreed to proceed with the plan and consent was signed. Patient is being admitted for inpatient treatment for surgery, pain control, PT, OT, prophylactic antibiotics, VTE prophylaxis, progressive ambulation and ADL's and discharge planning. The patient is planning to be discharged home with home health services

## 2021-10-17 NOTE — Care Plan (Signed)
[facility] RNCM call to patient to discuss her upcoming Left total knee arthroplasty with Dr. Ninfa Linden on 10/18/21. She is an Ortho bundle patient through [facility] and is agreeable to case management. She lives with her spouse, who will be assisting after discharge. Her daughter is also an Designer, jewellery in town as well. She will need a RW and has elevated toilets, but unsure if she wants to get a 3in1/BSC. Will make referral to [facility] for DME. Anticipate HHPT will be needed after a short hospital stay. Referral made to [facility] after choice provided. Reviewed all post op care instructions. Will continue to follow for needs.

## 2021-10-17 NOTE — Telephone Encounter (Signed)
Ortho bundle Pre-op call completed. 

## 2021-10-18 ENCOUNTER — Ambulatory Visit (HOSPITAL_COMMUNITY): Payer: Medicare Other | Admitting: Physician Assistant

## 2021-10-18 ENCOUNTER — Observation Stay (HOSPITAL_COMMUNITY): Payer: Medicare Other

## 2021-10-18 ENCOUNTER — Other Ambulatory Visit: Payer: Self-pay

## 2021-10-18 ENCOUNTER — Ambulatory Visit (HOSPITAL_BASED_OUTPATIENT_CLINIC_OR_DEPARTMENT_OTHER): Payer: Medicare Other | Admitting: Anesthesiology

## 2021-10-18 ENCOUNTER — Encounter (HOSPITAL_COMMUNITY): Payer: Self-pay | Admitting: Orthopaedic Surgery

## 2021-10-18 ENCOUNTER — Observation Stay (HOSPITAL_COMMUNITY)
Admission: RE | Admit: 2021-10-18 | Discharge: 2021-10-20 | Disposition: A | Discharge status: Home Health | Payer: Medicare Other | Source: Ambulatory Visit | Attending: Orthopaedic Surgery | Admitting: Orthopaedic Surgery

## 2021-10-18 ENCOUNTER — Encounter (HOSPITAL_COMMUNITY)
Admission: RE | Disposition: A | Discharge status: Home Health | Payer: Self-pay | Source: Ambulatory Visit | Attending: Orthopaedic Surgery

## 2021-10-18 DIAGNOSIS — Z96652 Presence of left artificial knee joint: Secondary | ICD-10-CM

## 2021-10-18 DIAGNOSIS — I10 Essential (primary) hypertension: Secondary | ICD-10-CM

## 2021-10-18 DIAGNOSIS — G4733 Obstructive sleep apnea (adult) (pediatric): Secondary | ICD-10-CM | POA: Diagnosis not present

## 2021-10-18 DIAGNOSIS — Z79899 Other long term (current) drug therapy: Secondary | ICD-10-CM | POA: Insufficient documentation

## 2021-10-18 DIAGNOSIS — Z7901 Long term (current) use of anticoagulants: Secondary | ICD-10-CM | POA: Insufficient documentation

## 2021-10-18 DIAGNOSIS — G8918 Other acute postprocedural pain: Secondary | ICD-10-CM | POA: Diagnosis not present

## 2021-10-18 DIAGNOSIS — M1712 Unilateral primary osteoarthritis, left knee: Secondary | ICD-10-CM

## 2021-10-18 DIAGNOSIS — Z471 Aftercare following joint replacement surgery: Secondary | ICD-10-CM | POA: Diagnosis not present

## 2021-10-18 DIAGNOSIS — Z85828 Personal history of other malignant neoplasm of skin: Secondary | ICD-10-CM | POA: Insufficient documentation

## 2021-10-18 DIAGNOSIS — Z9989 Dependence on other enabling machines and devices: Secondary | ICD-10-CM

## 2021-10-18 HISTORY — PX: TOTAL KNEE ARTHROPLASTY: SHX125

## 2021-10-18 SURGERY — ARTHROPLASTY, KNEE, TOTAL
Anesthesia: Regional | Site: Knee | Laterality: Left

## 2021-10-18 MED ORDER — ACETAMINOPHEN 10 MG/ML IV SOLN: 1000.0000 mg | Freq: Once | INTRAVENOUS | Status: DC | PRN

## 2021-10-18 MED ORDER — POVIDONE-IODINE 10 % EX SWAB
2.0000 | Freq: Once | CUTANEOUS | Status: AC
  Administered 2021-10-18: 2 via TOPICAL

## 2021-10-18 MED ORDER — ALUM & MAG HYDROXIDE-SIMETH 200-200-20 MG/5ML PO SUSP: 30.0000 mL | ORAL | Status: DC | PRN

## 2021-10-18 MED ORDER — ORAL CARE MOUTH RINSE: 15.0000 mL | Freq: Once | OROMUCOSAL | Status: AC

## 2021-10-18 MED ORDER — FENTANYL CITRATE PF 50 MCG/ML IJ SOSY: 25.0000 ug | PREFILLED_SYRINGE | INTRAMUSCULAR | Status: DC | PRN

## 2021-10-18 MED ORDER — OXYCODONE HCL 5 MG PO TABS
5.0000 mg | ORAL_TABLET | ORAL | Status: DC | PRN
  Administered 2021-10-18: 5 mg via ORAL
  Administered 2021-10-19 – 2021-10-20 (×7): 10 mg via ORAL
  Filled 2021-10-18 (×4): qty 2
  Filled 2021-10-18: qty 1
  Filled 2021-10-18 (×3): qty 2

## 2021-10-18 MED ORDER — EPHEDRINE SULFATE-NACL 50-0.9 MG/10ML-% IV SOSY
PREFILLED_SYRINGE | INTRAVENOUS | Status: DC | PRN
  Administered 2021-10-18: 5 mg via INTRAVENOUS
  Administered 2021-10-18: 10 mg via INTRAVENOUS

## 2021-10-18 MED ORDER — MIDAZOLAM HCL 2 MG/2ML IJ SOLN
INTRAMUSCULAR | Status: AC
  Filled 2021-10-18: qty 2

## 2021-10-18 MED ORDER — ACETAMINOPHEN 325 MG PO TABS: 325.0000 mg | ORAL_TABLET | Freq: Four times a day (QID) | ORAL | Status: DC | PRN

## 2021-10-18 MED ORDER — ONDANSETRON HCL 4 MG/2ML IJ SOLN: 4.0000 mg | Freq: Once | INTRAMUSCULAR | Status: DC | PRN

## 2021-10-18 MED ORDER — CALCIUM CARBONATE 1250 (500 CA) MG PO TABS
1200.0000 mg | ORAL_TABLET | Freq: Every day | ORAL | Status: DC
  Administered 2021-10-19 – 2021-10-20 (×2): 3125 mg via ORAL
  Filled 2021-10-18 (×2): qty 3

## 2021-10-18 MED ORDER — ONDANSETRON HCL 4 MG PO TABS
4.0000 mg | ORAL_TABLET | Freq: Four times a day (QID) | ORAL | Status: DC | PRN
  Administered 2021-10-20: 4 mg via ORAL
  Filled 2021-10-18: qty 1

## 2021-10-18 MED ORDER — PROPOFOL 10 MG/ML IV BOLUS
INTRAVENOUS | Status: AC
  Filled 2021-10-18: qty 20

## 2021-10-18 MED ORDER — CHLORHEXIDINE GLUCONATE 0.12 % MT SOLN
15.0000 mL | Freq: Once | OROMUCOSAL | Status: AC
  Administered 2021-10-18: 15 mL via OROMUCOSAL

## 2021-10-18 MED ORDER — LIDOCAINE 2% (20 MG/ML) 5 ML SYRINGE
INTRAMUSCULAR | Status: DC | PRN
  Administered 2021-10-18: 100 mg via INTRAVENOUS

## 2021-10-18 MED ORDER — FENTANYL CITRATE (PF) 100 MCG/2ML IJ SOLN
INTRAMUSCULAR | Status: DC | PRN
  Administered 2021-10-18 (×2): 50 ug via INTRAVENOUS

## 2021-10-18 MED ORDER — PHENYLEPHRINE HCL-NACL 20-0.9 MG/250ML-% IV SOLN
INTRAVENOUS | Status: DC | PRN
  Administered 2021-10-18: 30 ug/min via INTRAVENOUS

## 2021-10-18 MED ORDER — BUPIVACAINE-EPINEPHRINE (PF) 0.25% -1:200000 IJ SOLN
INTRAMUSCULAR | Status: AC
  Filled 2021-10-18: qty 30

## 2021-10-18 MED ORDER — AMISULPRIDE (ANTIEMETIC) 5 MG/2ML IV SOLN: 10.0000 mg | Freq: Once | INTRAVENOUS | Status: DC | PRN

## 2021-10-18 MED ORDER — SODIUM CHLORIDE 0.9 % IV SOLN: INTRAVENOUS | Status: DC

## 2021-10-18 MED ORDER — SODIUM CHLORIDE 0.9 % IR SOLN
Status: DC | PRN
  Administered 2021-10-18: 1000 mL

## 2021-10-18 MED ORDER — HYDROMORPHONE HCL 1 MG/ML IJ SOLN: 0.5000 mg | INTRAMUSCULAR | Status: DC | PRN

## 2021-10-18 MED ORDER — LACTATED RINGERS IV SOLN: INTRAVENOUS | Status: DC

## 2021-10-18 MED ORDER — ROPIVACAINE HCL 5 MG/ML IJ SOLN
INTRAMUSCULAR | Status: DC | PRN
  Administered 2021-10-18: 30 mL via PERINEURAL

## 2021-10-18 MED ORDER — 0.9 % SODIUM CHLORIDE (POUR BTL) OPTIME
TOPICAL | Status: DC | PRN
  Administered 2021-10-18: 1000 mL

## 2021-10-18 MED ORDER — DILTIAZEM HCL ER COATED BEADS 240 MG PO CP24
240.0000 mg | ORAL_CAPSULE | Freq: Every day | ORAL | Status: DC
  Filled 2021-10-18: qty 1

## 2021-10-18 MED ORDER — MIDAZOLAM HCL 5 MG/5ML IJ SOLN
INTRAMUSCULAR | Status: DC | PRN
  Administered 2021-10-18 (×2): 1 mg via INTRAVENOUS

## 2021-10-18 MED ORDER — BENAZEPRIL HCL 20 MG PO TABS: 20.0000 mg | ORAL_TABLET | Freq: Every day | ORAL | Status: DC

## 2021-10-18 MED ORDER — PROPOFOL 10 MG/ML IV BOLUS
INTRAVENOUS | Status: DC | PRN
  Administered 2021-10-18: 20 mg via INTRAVENOUS

## 2021-10-18 MED ORDER — METOCLOPRAMIDE HCL 5 MG PO TABS: 5.0000 mg | ORAL_TABLET | Freq: Three times a day (TID) | ORAL | Status: DC | PRN

## 2021-10-18 MED ORDER — ONDANSETRON HCL 4 MG/2ML IJ SOLN
INTRAMUSCULAR | Status: AC
  Filled 2021-10-18: qty 2

## 2021-10-18 MED ORDER — ORAL CARE MOUTH RINSE: 15.0000 mL | OROMUCOSAL | Status: DC | PRN

## 2021-10-18 MED ORDER — PROPOFOL 1000 MG/100ML IV EMUL
INTRAVENOUS | Status: AC
  Filled 2021-10-18: qty 100

## 2021-10-18 MED ORDER — EPHEDRINE 5 MG/ML INJ
INTRAVENOUS | Status: AC
  Filled 2021-10-18: qty 5

## 2021-10-18 MED ORDER — CEFAZOLIN SODIUM-DEXTROSE 2-4 GM/100ML-% IV SOLN
2.0000 g | INTRAVENOUS | Status: AC
  Administered 2021-10-18: 2 g via INTRAVENOUS
  Filled 2021-10-18: qty 100

## 2021-10-18 MED ORDER — DEXAMETHASONE SODIUM PHOSPHATE 10 MG/ML IJ SOLN
INTRAMUSCULAR | Status: DC | PRN
  Administered 2021-10-18: 10 mg via INTRAVENOUS

## 2021-10-18 MED ORDER — METOCLOPRAMIDE HCL 5 MG/ML IJ SOLN: 5.0000 mg | Freq: Three times a day (TID) | INTRAMUSCULAR | Status: DC | PRN

## 2021-10-18 MED ORDER — TRANEXAMIC ACID-NACL 1000-0.7 MG/100ML-% IV SOLN
1000.0000 mg | INTRAVENOUS | Status: AC
  Administered 2021-10-18: 1000 mg via INTRAVENOUS
  Filled 2021-10-18: qty 100

## 2021-10-18 MED ORDER — APIXABAN 5 MG PO TABS
5.0000 mg | ORAL_TABLET | Freq: Two times a day (BID) | ORAL | Status: DC
  Administered 2021-10-19 – 2021-10-20 (×3): 5 mg via ORAL
  Filled 2021-10-18 (×3): qty 1

## 2021-10-18 MED ORDER — FENTANYL CITRATE (PF) 100 MCG/2ML IJ SOLN
INTRAMUSCULAR | Status: AC
  Filled 2021-10-18: qty 2

## 2021-10-18 MED ORDER — HYDROMORPHONE HCL 2 MG PO TABS: 2.0000 mg | ORAL_TABLET | ORAL | Status: DC | PRN

## 2021-10-18 MED ORDER — BUPIVACAINE-EPINEPHRINE 0.25% -1:200000 IJ SOLN
INTRAMUSCULAR | Status: DC | PRN
  Administered 2021-10-18: 30 mL

## 2021-10-18 MED ORDER — MENTHOL 3 MG MT LOZG: 1.0000 | LOZENGE | OROMUCOSAL | Status: DC | PRN

## 2021-10-18 MED ORDER — LIDOCAINE HCL (PF) 2 % IJ SOLN
INTRAMUSCULAR | Status: AC
  Filled 2021-10-18: qty 5

## 2021-10-18 MED ORDER — BENAZEPRIL HCL 20 MG PO TABS
40.0000 mg | ORAL_TABLET | Freq: Every day | ORAL | Status: DC
  Administered 2021-10-19 – 2021-10-20 (×2): 40 mg via ORAL
  Filled 2021-10-18 (×2): qty 2

## 2021-10-18 MED ORDER — BENAZEPRIL-HYDROCHLOROTHIAZIDE 20-25 MG PO TABS: 1.0000 | ORAL_TABLET | Freq: Every day | ORAL | Status: DC

## 2021-10-18 MED ORDER — CEFAZOLIN SODIUM-DEXTROSE 1-4 GM/50ML-% IV SOLN
1.0000 g | Freq: Four times a day (QID) | INTRAVENOUS | Status: AC
  Administered 2021-10-18 (×2): 1 g via INTRAVENOUS
  Filled 2021-10-18 (×2): qty 50

## 2021-10-18 MED ORDER — VITAMIN D 25 MCG (1000 UNIT) PO TABS
1000.0000 [IU] | ORAL_TABLET | Freq: Every day | ORAL | Status: DC
  Administered 2021-10-19 – 2021-10-20 (×2): 1000 [IU] via ORAL
  Filled 2021-10-18 (×2): qty 1

## 2021-10-18 MED ORDER — POTASSIUM CHLORIDE CRYS ER 20 MEQ PO TBCR
20.0000 meq | EXTENDED_RELEASE_TABLET | Freq: Every day | ORAL | Status: DC
  Administered 2021-10-18 – 2021-10-20 (×3): 20 meq via ORAL
  Filled 2021-10-18 (×3): qty 1

## 2021-10-18 MED ORDER — DOCUSATE SODIUM 100 MG PO CAPS
100.0000 mg | ORAL_CAPSULE | Freq: Two times a day (BID) | ORAL | Status: DC
  Administered 2021-10-18 – 2021-10-20 (×5): 100 mg via ORAL
  Filled 2021-10-18 (×5): qty 1

## 2021-10-18 MED ORDER — HYDROCHLOROTHIAZIDE 12.5 MG PO TABS
12.5000 mg | ORAL_TABLET | Freq: Every day | ORAL | Status: DC
  Administered 2021-10-19 – 2021-10-20 (×2): 12.5 mg via ORAL
  Filled 2021-10-18 (×2): qty 1

## 2021-10-18 MED ORDER — PROPOFOL 500 MG/50ML IV EMUL
INTRAVENOUS | Status: DC | PRN
  Administered 2021-10-18: 50 ug/kg/min via INTRAVENOUS

## 2021-10-18 MED ORDER — PANTOPRAZOLE SODIUM 40 MG PO TBEC
40.0000 mg | DELAYED_RELEASE_TABLET | Freq: Every day | ORAL | Status: DC
  Administered 2021-10-18 – 2021-10-20 (×3): 40 mg via ORAL
  Filled 2021-10-18 (×3): qty 1

## 2021-10-18 MED ORDER — PHENOL 1.4 % MT LIQD: 1.0000 | OROMUCOSAL | Status: DC | PRN

## 2021-10-18 MED ORDER — PRAVASTATIN SODIUM 20 MG PO TABS
20.0000 mg | ORAL_TABLET | Freq: Every day | ORAL | Status: DC
  Administered 2021-10-18 – 2021-10-19 (×2): 20 mg via ORAL
  Filled 2021-10-18 (×2): qty 1

## 2021-10-18 MED ORDER — ONDANSETRON HCL 4 MG/2ML IJ SOLN: 4.0000 mg | Freq: Four times a day (QID) | INTRAMUSCULAR | Status: DC | PRN

## 2021-10-18 MED ORDER — BUPIVACAINE IN DEXTROSE 0.75-8.25 % IT SOLN
INTRATHECAL | Status: DC | PRN
  Administered 2021-10-18: 1.6 mL via INTRATHECAL

## 2021-10-18 MED ORDER — DEXAMETHASONE SODIUM PHOSPHATE 10 MG/ML IJ SOLN
INTRAMUSCULAR | Status: AC
  Filled 2021-10-18: qty 1

## 2021-10-18 SURGICAL SUPPLY — 63 items
APL SKNCLS STERI-STRIP NONHPOA (GAUZE/BANDAGES/DRESSINGS)
BAG COUNTER SPONGE SURGICOUNT (BAG) IMPLANT
BAG SPEC THK2 15X12 ZIP CLS (MISCELLANEOUS) ×1
BAG SPNG CNTER NS LX DISP (BAG)
BAG ZIPLOCK 12X15 (MISCELLANEOUS) ×1 IMPLANT
BENZOIN TINCTURE PRP APPL 2/3 (GAUZE/BANDAGES/DRESSINGS) IMPLANT
BLADE SAG 18X100X1.27 (BLADE) ×1 IMPLANT
BLADE SURG SZ10 CARB STEEL (BLADE) ×2 IMPLANT
BNDG ELASTIC 6X5.8 VLCR STR LF (GAUZE/BANDAGES/DRESSINGS) ×2 IMPLANT
BOWL SMART MIX CTS (DISPOSABLE) IMPLANT
BSPLAT TIB 5D E CMNT STM LT (Knees) ×1 IMPLANT
CEMENT BONE R 1X40 (Cement) IMPLANT
COOLER ICEMAN CLASSIC (MISCELLANEOUS) ×1 IMPLANT
COVER SURGICAL LIGHT HANDLE (MISCELLANEOUS) ×1 IMPLANT
CUFF TOURN SGL QUICK 34 (TOURNIQUET CUFF) ×1
CUFF TRNQT CYL 34X4.125X (TOURNIQUET CUFF) ×1 IMPLANT
DRAPE INCISE IOBAN 66X45 STRL (DRAPES) ×1 IMPLANT
DRAPE U-SHAPE 47X51 STRL (DRAPES) ×1 IMPLANT
DURAPREP 26ML APPLICATOR (WOUND CARE) ×1 IMPLANT
ELECT BLADE TIP CTD 4 INCH (ELECTRODE) ×1 IMPLANT
ELECT REM PT RETURN 15FT ADLT (MISCELLANEOUS) ×1 IMPLANT
FEMUR CMT CR STD SZ 6 LT KNEE (Joint) ×1 IMPLANT
FEMUR CMTD CR STD SZ 6 LT KNEE (Joint) IMPLANT
GAUZE PAD ABD 7.5X8 STRL (GAUZE/BANDAGES/DRESSINGS) IMPLANT
GAUZE PAD ABD 8X10 STRL (GAUZE/BANDAGES/DRESSINGS) ×2 IMPLANT
GAUZE SPONGE 4X4 12PLY STRL (GAUZE/BANDAGES/DRESSINGS) ×1 IMPLANT
GAUZE XEROFORM 1X8 LF (GAUZE/BANDAGES/DRESSINGS) IMPLANT
GLOVE BIO SURGEON STRL SZ7.5 (GLOVE) ×1 IMPLANT
GLOVE BIOGEL PI IND STRL 8 (GLOVE) ×2 IMPLANT
GLOVE ECLIPSE 8.0 STRL XLNG CF (GLOVE) ×1 IMPLANT
GOWN STRL REUS W/ TWL XL LVL3 (GOWN DISPOSABLE) ×2 IMPLANT
GOWN STRL REUS W/TWL XL LVL3 (GOWN DISPOSABLE) ×2
HANDPIECE INTERPULSE COAX TIP (DISPOSABLE) ×1
HDLS TROCR DRIL PIN KNEE 75 (PIN) ×1
HOLDER FOLEY CATH W/STRAP (MISCELLANEOUS) IMPLANT
IMMOBILIZER KNEE 20 (SOFTGOODS) ×1
IMMOBILIZER KNEE 20 THIGH 36 (SOFTGOODS) ×1 IMPLANT
KIT TURNOVER KIT A (KITS) IMPLANT
NS IRRIG 1000ML POUR BTL (IV SOLUTION) ×1 IMPLANT
PACK TOTAL KNEE CUSTOM (KITS) ×1 IMPLANT
PAD COLD SHLDR WRAP-ON (PAD) ×1 IMPLANT
PADDING CAST COTTON 6X4 STRL (CAST SUPPLIES) ×2 IMPLANT
PIN DRILL HDLS TROCAR 75 4PK (PIN) IMPLANT
PROTECTOR NERVE ULNAR (MISCELLANEOUS) ×1 IMPLANT
SCREW FEMALE HEX FIX 25X2.5 (ORTHOPEDIC DISPOSABLE SUPPLIES) IMPLANT
SET HNDPC FAN SPRY TIP SCT (DISPOSABLE) ×1 IMPLANT
SET PAD KNEE POSITIONER (MISCELLANEOUS) ×1 IMPLANT
SPIKE FLUID TRANSFER (MISCELLANEOUS) IMPLANT
STAPLER VISISTAT 35W (STAPLE) IMPLANT
STEM POLY PAT PLY 29M KNEE (Knees) IMPLANT
STEM TIB ST PERS 14+30 (Stem) IMPLANT
STEM TIBIA 5 DEG SZ E L KNEE (Knees) IMPLANT
STEM TIBIAL SZ6-7 EF 12 LT (Knees) IMPLANT
STRIP CLOSURE SKIN 1/2X4 (GAUZE/BANDAGES/DRESSINGS) IMPLANT
SUT MNCRL AB 4-0 PS2 18 (SUTURE) IMPLANT
SUT VIC AB 0 CT1 27 (SUTURE) ×1
SUT VIC AB 0 CT1 27XBRD ANTBC (SUTURE) ×1 IMPLANT
SUT VIC AB 1 CT1 36 (SUTURE) ×2 IMPLANT
SUT VIC AB 2-0 CT1 27 (SUTURE) ×2
SUT VIC AB 2-0 CT1 TAPERPNT 27 (SUTURE) ×2 IMPLANT
TIBIA STEM 5 DEG SZ E L KNEE (Knees) ×1 IMPLANT
TRAY FOLEY MTR SLVR 16FR STAT (SET/KITS/TRAYS/PACK) IMPLANT
WATER STERILE IRR 1000ML POUR (IV SOLUTION) ×2 IMPLANT

## 2021-10-18 NOTE — Op Note (Signed)
Operative Note  Date of operation: 10/18/2021 Preoperative diagnosis: Left knee osteoarthritis Postoperative diagnosis: Same  Procedure: Left cemented total knee arthroplasty  Implants: Biomet/Zimmer persona knee system with size 6 left CR standard femur, size E left tibial tray, 12 mm thickness medial congruent left polythene insert, 29 mm patella button  Surgeon: Lind Guest. Ninfa Linden, MD Assistant: Benita Stabile, PA-C  Anesthesia: #1 left lower extremity regional block, #2 spinal, #3 local Tourniquet time: Less than 1 hour Antibiotics: 2 g IV Ancef EBL: Less than 161 cc Complications: None  Indications: The patient is a 76 year old female well-known to [place].  She has severe end-stage arthritis of her left knee that is been well-documented.  She has tried and failed all forms conservative treatment and at this point her left knee pain is daily and it is detrimentally affecting her mobility, her quality of life and her actives daily living.  We have recommended a total knee arthroplasty and she does wish to proceed with this as well.  We talked about the risk of acute blood loss anemia, nerve vessel injury, fracture, infection, DVT, implant failure, and wound healing issues.  We talked about her goals being hopefully decrease pain, improve mobility and improve quality of life.  Procedure description: After informed consent obtained probate left knee was marked, a left lower extremity block was obtained in the holding room and the patient was brought to the operating room and set up on the operating table where spinal anesthesia was obtained.  She was then laid in supine position on the operating table and a Foley catheter was placed.  A nonsterile tourniquet was placed around her upper left thigh and her left thigh, knee, leg, ankle and foot were prepped and draped with DuraPrep and sterile drapes.  A timeout was called and she was notified as the correct patient and the correct left knee.  We  then used Esmarch to wrap out the leg and the tourniquet was plated to 300 mm of pressure.  A midline incision was then made over the patella and carried proximally distally.  Dissection was carried down to the knee joint and a medial parapatellar arthrotomy was obtained.  A large joint effusion was encountered.  We removed synovium from around the knee and then large osteophytes from all 3 compartments including the patella.  Remnants of the ACL as well as medial lateral meniscus were removed.  With the knee in a flexed position we set the extramedullary cutting guide for making her proximal tibia cut correction for varus and valgus and a 3 degree slope with taking 2 mm off the low side.  We made this cut without difficulty.  We then went to the femur and used an intramedullary cutting guide for distal femoral cut setting this for left knee at 5 degrees externally rotated for 10 mm distal femoral cut.  We brought the knee back down to full extension and had achieved full extension with a 10 mm cut.  Attention was then turned back to the femur we put our femoral sizing guide based off the epicondylar axis and based off of this chose a size 6 femur.  We put a 4-in-1 cutting block for a size 6 femur and made her anterior posterior cuts followed by her chamfer cuts.  We then back to the tibia and chose a size EE left tibial tray for coverage over the tibial plateau then making our drill hole and keel punch off of this.  We then trialed our size E  left tibia with a size 6 left femur and went up to a 12 mm medial congruent fixed-bearing polythene insert trial.  We are pleased with range of motion and stability of the knee with that trial.  We then made a patella cut and drilled 3 holes for a size 29 patella button.  With all trials rotation of the knee again we put her through several cycles of motion.  We then removed all transportation of the knee and irrigate the knee with normal saline solution.  We placed our  Marcaine with epinephrine around the arthrotomy.  We then mixed our cement with the knee in a flexed position cemented our Biomet Zimmer size E left tibial tray with a 30 mm extension tip.  We cemented our size 6 left CR standard femur.  We placed our real 12 mm thickness left fixed-bearing poly insert which was medial congruent and cemented our size 29 patella button.  We then held the knee fully extended and compressed while the cement hardened.  Once it hardened with the tourniquet down and removed excess cement debris from the knee.  hemostasis was obtained with electrocautery.  The arthrotomy was then closed with interrupted #1 Vicryl suture followed by 0 Vicryl to close the deep tissue and 2-0 Vicryl close subcutaneous tissue.  The skin was closed with staples.  Well-padded sterile dressings applied.  The patient was taken recovery in stable addition with all final counts being correct and no complications noted.  Benita Stabile, PA-C assisted during the entire case from beginning to end and his assistance was medically necessary for helping soft tissue retraction as well as helping guide implant placement and a layered closure of the wound.

## 2021-10-18 NOTE — Anesthesia Procedure Notes (Signed)
Anesthesia Regional Block: Adductor canal block   Pre-Anesthetic Checklist: , timeout performed,  Correct Patient, Correct Site, Correct Laterality,  Correct Procedure,, site marked,  Risks and benefits discussed,  Surgical consent,  Pre-op evaluation,  At surgeon's request and post-op pain management  Laterality: Left  Prep: chloraprep       Needles:  Injection technique: Single-shot  Needle Type: Echogenic Stimulator Needle     Needle Length: 9cm  Needle Gauge: 21     Additional Needles:   Procedures:,,,, ultrasound used (permanent image in chart),,    Narrative:  Start time: 10/18/2021 6:40 AM End time: 10/18/2021 6:50 AM Injection made incrementally with aspirations every 5 mL.  Performed by: Personally  Anesthesiologist: Murvin Natal, MD  Additional Notes: Functioning IV was confirmed and monitors were applied. A time-out was performed. Hand hygiene and sterile gloves were used. The thigh was placed in a frog-leg position and prepped in a sterile fashion. A 65m 21ga Arrow echogenic stimulator needle was placed using ultrasound guidance.  Negative aspiration and negative test dose prior to incremental administration of local anesthetic. The patient tolerated the procedure well.

## 2021-10-18 NOTE — Interval H&P Note (Signed)
History and Physical Interval Note: The patient understands that she is here today for a left knee replacement to treat her left knee osteoarthritis.  There is been no acute or interval change in her medical status.  See H&P.  The risks and benefits of surgery been discussed in detail and informed consent was obtained.  The left operative knee has been marked.  10/18/2021 6:59 AM  Bridget [name]  has presented today for surgery, with the diagnosis of OSTEOARTHRITIS / [place].  The various methods of treatment have been discussed with the patient and family. After consideration of risks, benefits and other options for treatment, the patient has consented to  Procedure(s): LEFT TOTAL KNEE ARTHROPLASTY (Left) as a surgical intervention.  The patient's history has been reviewed, patient examined, no change in status, stable for surgery.  I have reviewed the patient's chart and labs.  Questions were answered to the patient's satisfaction.     Mcarthur Rossetti

## 2021-10-18 NOTE — Transfer of Care (Signed)
Immediate Anesthesia Transfer of Care Note  Patient: Bridget [name]  Procedure(s) Performed: LEFT TOTAL KNEE ARTHROPLASTY (Left: Knee)  Patient Location: PACU  Anesthesia Type:Spinal  Level of Consciousness: awake and alert   Airway & Oxygen Therapy: Patient Spontanous Breathing and Patient connected to face mask oxygen  Post-op Assessment: Report given to RN and Post -op Vital signs reviewed and stable  Post vital signs: Reviewed and stable  Last Vitals:  Vitals Value Taken Time  BP    Temp    Pulse 61 10/18/21 0924  Resp 19 10/18/21 0924  SpO2 97 % 10/18/21 0924  Vitals shown include unvalidated device data.  Last Pain:  Vitals:   10/18/21 0602  TempSrc: Oral  PainSc:       Patients Stated Pain Goal: 5 (09/81/19 1478)  Complications: No notable events documented.

## 2021-10-18 NOTE — Anesthesia Procedure Notes (Signed)
Spinal  Patient location during procedure: OR Start time: 10/18/2021 7:15 AM End time: 10/18/2021 7:25 AM Reason for block: surgical anesthesia Staffing Performed: anesthesiologist  Anesthesiologist: Murvin Natal, MD Performed by: Murvin Natal, MD Authorized by: Murvin Natal, MD   Preanesthetic Checklist Completed: patient identified, IV checked, risks and benefits discussed, surgical consent, monitors and equipment checked, pre-op evaluation and timeout performed Spinal Block Patient position: sitting Prep: DuraPrep Patient monitoring: cardiac monitor, continuous pulse ox and blood pressure Approach: left paramedian Location: L3-4 Injection technique: single-shot Needle Needle type: Quincke  Needle gauge: 22 G Needle length: 9 cm Assessment Sensory level: T10 Events: CSF return Additional Notes Functioning IV was confirmed and monitors were applied. Sterile prep and drape, including hand hygiene and sterile gloves were used. The patient was positioned and the spine was prepped. The skin was anesthetized with lidocaine.  Free flow of clear CSF was obtained after multiple attempts  prior to injecting local anesthetic into the CSF.  The spinal needle aspirated freely following injection.  The needle was carefully withdrawn.  The patient tolerated the procedure well.

## 2021-10-18 NOTE — Evaluation (Signed)
Physical Therapy Evaluation Patient Details Name: Bridget [name] MRN: [id number] DOB: 1945/03/21 Today's Date: 10/18/2021  History of Present Illness  Pt s/p L TKR  Clinical Impression  Pt s/p L: TKR and presents with decreased L LE strength/ROM, post op pain and obesity limiting functional mobility.  Pt should progress to dc home with family assist and follow up HHPT.    Recommendations for follow up therapy are one component of a multi-disciplinary discharge planning process, led by the attending physician.  Recommendations may be updated based on patient status, additional functional criteria and insurance authorization.  Follow Up Recommendations Follow physician's recommendations for discharge plan and follow up therapies      Assistance Recommended at Discharge Intermittent Supervision/Assistance  Patient can return home with the following  A little help with walking and/or transfers;A little help with bathing/dressing/bathroom;Assistance with cooking/housework;Assist for transportation;Help with stairs or ramp for entrance    Equipment Recommendations Rolling walker (2 wheels) (has been delivered to room)  Recommendations for Other Services       Functional Status Assessment Patient has had a recent decline in their functional status and demonstrates the ability to make significant improvements in function in a reasonable and predictable amount of time.     Precautions / Restrictions Precautions Precautions: Knee;Fall Required Braces or Orthoses: Knee Immobilizer - Left Knee Immobilizer - Left: Discontinue once straight leg raise with < 10 degree lag Restrictions Weight Bearing Restrictions: No Other Position/Activity Restrictions: WBAT      Mobility  Bed Mobility Overal bed mobility: Needs Assistance Bed Mobility: Supine to Sit     Supine to sit: Min assist, Mod assist, +2 for physical assistance, +2 for safety/equipment     General bed mobility comments: Use  of bed rail. cues for sequence and use of R LE to self assist; physical assist to manage L LE and to bring trunk to upright    Transfers Overall transfer level: Needs assistance Equipment used: Rolling walker (2 wheels) Transfers: Sit to/from Stand Sit to Stand: Min assist, +2 physical assistance, +2 safety/equipment, From elevated surface           General transfer comment: cues for LE management and use of UEs to self assist    Ambulation/Gait Ambulation/Gait assistance: Min assist, +2 safety/equipment Gait Distance (Feet): 26 Feet Assistive device: Rolling walker (2 wheels) Gait Pattern/deviations: Step-to pattern, Decreased step length - right, Decreased step length - left, Shuffle, Trunk flexed Gait velocity: decr     General Gait Details: cues for sequence, posture and position from [employer]            Wheelchair Mobility    Modified Rankin (Stroke Patients Only)       Balance Overall balance assessment: Needs assistance Sitting-balance support: Feet supported, No upper extremity supported Sitting balance-Leahy Scale: Good     Standing balance support: Bilateral upper extremity supported Standing balance-Leahy Scale: Poor                               Pertinent Vitals/Pain Pain Assessment Pain Assessment: 0-10 Pain Score: 7  Pain Location: L knee Pain Descriptors / Indicators: Aching, Sore Pain Intervention(s): Limited activity within patient's tolerance, Monitored during session, Premedicated before session, Ice applied    Home Living Family/patient expects to be discharged to:: Private residence Living Arrangements: Spouse/significant other Available Help at Discharge: Family Type of Home: House Home Access: Stairs to enter Entrance Stairs-Rails: None Entrance  Stairs-Number of Steps: 1   Home Layout: One level Home Equipment:  (RW has been delivered to room)      Prior Function Prior Level of Function : Independent/Modified  Independent                     Hand Dominance        Extremity/Trunk Assessment   Upper Extremity Assessment Upper Extremity Assessment: Overall WFL for tasks assessed    Lower Extremity Assessment Lower Extremity Assessment: LLE deficits/detail    Cervical / Trunk Assessment Cervical / Trunk Assessment: Normal  Communication   Communication: No difficulties  Cognition Arousal/Alertness: Awake/alert Behavior During Therapy: WFL for tasks assessed/performed Overall Cognitive Status: Within Functional Limits for tasks assessed                                          General Comments      Exercises Total Joint Exercises Ankle Circles/Pumps: AROM, Both, 15 reps, Supine   Assessment/Plan    PT Assessment Patient needs continued PT services  PT Problem List Decreased strength;Decreased range of motion;Decreased activity tolerance;Decreased balance;Decreased mobility;Decreased knowledge of use of DME;Obesity;Pain       PT Treatment Interventions DME instruction;Gait training;Therapeutic activities;Functional mobility training;Stair training;Therapeutic exercise;Patient/family education    PT Goals (Current goals can be found in the Care Plan section)  Acute Rehab PT Goals Patient Stated Goal: Regain IND PT Goal Formulation: With patient Time For Goal Achievement: 10/25/21 Potential to Achieve Goals: Good    Frequency 7X/week     Co-evaluation               AM-PAC PT "6 Clicks" Mobility  Outcome Measure Help needed turning from your back to your side while in a flat bed without using bedrails?: A Lot Help needed moving from lying on your back to sitting on the side of a flat bed without using bedrails?: A Lot Help needed moving to and from a bed to a chair (including a wheelchair)?: A Lot Help needed standing up from a chair using your arms (e.g., wheelchair or bedside chair)?: A Lot Help needed to walk in hospital room?: A  Lot Help needed climbing 3-5 steps with a railing? : A Lot 6 Click Score: 12    End of Session Equipment Utilized During Treatment: Gait belt;Left knee immobilizer Activity Tolerance: Patient tolerated treatment well;Patient limited by fatigue;Patient limited by pain Patient left: in chair;with call bell/phone within reach;with chair alarm set;with family/visitor present Nurse Communication: Mobility status PT Visit Diagnosis: Difficulty in walking, not elsewhere classified (R26.2)    Time: 9407-6808 PT Time Calculation (min) (ACUTE ONLY): 27 min   Charges:   PT Evaluation $PT Eval Low Complexity: 1 Low PT Treatments $Gait Training: 8-22 mins        [place] Pager [phone] Office [phone]   Zaharah Amir 10/18/2021, 6:18 PM

## 2021-10-18 NOTE — Anesthesia Procedure Notes (Signed)
Date/Time: 10/18/2021 7:14 AM  Performed by: Sharlette Dense, CRNAOxygen Delivery Method: Simple face mask

## 2021-10-18 NOTE — Anesthesia Procedure Notes (Signed)
Date/Time: 10/18/2021 6:41 AM  Performed by: Sharlette Dense, CRNAOxygen Delivery Method: Nasal cannula

## 2021-10-18 NOTE — Anesthesia Postprocedure Evaluation (Signed)
Anesthesia Post Note  Patient: SHERALYN PINEGAR  Procedure(s) Performed: LEFT TOTAL KNEE ARTHROPLASTY (Left: Knee)     Patient location during evaluation: PACU Anesthesia Type: Regional and Spinal Level of consciousness: awake Pain management: pain level controlled Vital Signs Assessment: post-procedure vital signs reviewed and stable Respiratory status: spontaneous breathing, nonlabored ventilation, respiratory function stable and patient connected to nasal cannula oxygen Cardiovascular status: stable and blood pressure returned to baseline Postop Assessment: no apparent nausea or vomiting Anesthetic complications: no   No notable events documented.  Last Vitals:  Vitals:   10/18/21 1257 10/18/21 1729  BP: (!) 140/66 136/65  Pulse: (!) 59 (!) 57  Resp: 16 16  Temp: 36.6 C 36.6 C  SpO2: 96% 97%    Last Pain:  Vitals:   10/18/21 1729  TempSrc: Oral  PainSc:                  Karyl Kinnier Jessicca Stitzer

## 2021-10-19 DIAGNOSIS — Z85828 Personal history of other malignant neoplasm of skin: Secondary | ICD-10-CM | POA: Diagnosis not present

## 2021-10-19 DIAGNOSIS — Z79899 Other long term (current) drug therapy: Secondary | ICD-10-CM | POA: Diagnosis not present

## 2021-10-19 DIAGNOSIS — I10 Essential (primary) hypertension: Secondary | ICD-10-CM | POA: Diagnosis not present

## 2021-10-19 DIAGNOSIS — Z7901 Long term (current) use of anticoagulants: Secondary | ICD-10-CM | POA: Diagnosis not present

## 2021-10-19 DIAGNOSIS — M1712 Unilateral primary osteoarthritis, left knee: Secondary | ICD-10-CM | POA: Diagnosis not present

## 2021-10-19 LAB — CBC
HCT: 33.7 % — ABNORMAL LOW (ref 36.0–46.0)
Hemoglobin: 10.9 g/dL — ABNORMAL LOW (ref 12.0–15.0)
MCH: 27.5 pg (ref 26.0–34.0)
MCHC: 32.3 g/dL (ref 30.0–36.0)
MCV: 85.1 fL (ref 80.0–100.0)
Platelets: 253 10*3/uL (ref 150–400)
RBC: 3.96 MIL/uL (ref 3.87–5.11)
RDW: 14.6 % (ref 11.5–15.5)
WBC: 14.6 10*3/uL — ABNORMAL HIGH (ref 4.0–10.5)
nRBC: 0 % (ref 0.0–0.2)

## 2021-10-19 LAB — BASIC METABOLIC PANEL
Anion gap: 6 (ref 5–15)
BUN: 14 mg/dL (ref 8–23)
CO2: 25 mmol/L (ref 22–32)
Calcium: 8.8 mg/dL — ABNORMAL LOW (ref 8.9–10.3)
Chloride: 106 mmol/L (ref 98–111)
Creatinine, Ser: 0.55 mg/dL (ref 0.44–1.00)
GFR, Estimated: >60 mL/min (ref >60)
Glucose, Bld: 168 mg/dL — ABNORMAL HIGH (ref 70–99)
Potassium: 4.1 mmol/L (ref 3.5–5.1)
Sodium: 137 mmol/L (ref 135–145)

## 2021-10-19 MED ORDER — ONDANSETRON HCL 4 MG PO TABS: 4.0000 mg | ORAL_TABLET | Freq: Four times a day (QID) | ORAL | 0 refills | Status: DC | PRN

## 2021-10-19 MED ORDER — METHOCARBAMOL 500 MG PO TABS: 500.0000 mg | ORAL_TABLET | Freq: Three times a day (TID) | ORAL | Status: DC | PRN

## 2021-10-19 MED ORDER — OXYCODONE HCL 5 MG PO TABS: 5.0000 mg | ORAL_TABLET | ORAL | 0 refills | Status: DC | PRN

## 2021-10-19 MED ORDER — METHOCARBAMOL 500 MG PO TABS: 500.0000 mg | ORAL_TABLET | Freq: Three times a day (TID) | ORAL | 1 refills | Status: DC | PRN

## 2021-10-19 NOTE — Progress Notes (Signed)
Physical Therapy Treatment Patient Details Name: Bridget [name] MRN: [id number] DOB: 09-06-1945 Today's Date: 10/19/2021   History of Present Illness Pt s/p L TKR    PT Comments    Pt very cooperative and with noted improvement in activity tolerance and with decreased assist level for most tasks.  Pt pleased with progress this am.   Recommendations for follow up therapy are one component of a multi-disciplinary discharge planning process, led by the attending physician.  Recommendations may be updated based on patient status, additional functional criteria and insurance authorization.  Follow Up Recommendations  Follow physician's recommendations for discharge plan and follow up therapies     Assistance Recommended at Discharge Intermittent Supervision/Assistance  Patient can return home with the following A little help with walking and/or transfers;A little help with bathing/dressing/bathroom;Assistance with cooking/housework;Assist for transportation;Help with stairs or ramp for entrance   Equipment Recommendations  Rolling walker (2 wheels)    Recommendations for Other Services       Precautions / Restrictions Precautions Precautions: Knee;Fall Required Braces or Orthoses: Knee Immobilizer - Left Knee Immobilizer - Left: Discontinue once straight leg raise with < 10 degree lag Restrictions Weight Bearing Restrictions: No Other Position/Activity Restrictions: WBAT     Mobility  Bed Mobility Overal bed mobility: Needs Assistance Bed Mobility: Supine to Sit     Supine to sit: Min assist     General bed mobility comments: Increased time with cues for sequence and use of R LE to self assist; physical assist to manage L LE    Transfers Overall transfer level: Needs assistance Equipment used: Rolling walker (2 wheels) Transfers: Sit to/from Stand Sit to Stand: Min assist, Mod assist           General transfer comment: cues for LE management and use of UEs to  self assist    Ambulation/Gait Ambulation/Gait assistance: Min assist Gait Distance (Feet): 58 Feet Assistive device: Rolling walker (2 wheels) Gait Pattern/deviations: Step-to pattern, Decreased step length - right, Decreased step length - left, Shuffle, Trunk flexed Gait velocity: decr     General Gait Details: cues for sequence, posture and position from [employer]             Wheelchair Mobility    Modified Rankin (Stroke Patients Only)       Balance Overall balance assessment: Needs assistance Sitting-balance support: Feet supported, No upper extremity supported Sitting balance-Leahy Scale: Good     Standing balance support: Bilateral upper extremity supported Standing balance-Leahy Scale: Poor                              Cognition Arousal/Alertness: Awake/alert Behavior During Therapy: WFL for tasks assessed/performed Overall Cognitive Status: Within Functional Limits for tasks assessed                                          Exercises Total Joint Exercises Ankle Circles/Pumps: AROM, Both, 15 reps, Supine Quad Sets: AROM, Both, 10 reps, Supine Heel Slides: AAROM, Left, 15 reps, Supine Hip ABduction/ADduction: AAROM, Left, 10 reps, Supine    General Comments        Pertinent Vitals/Pain Pain Assessment Pain Assessment: 0-10 Pain Score: 6  Pain Location: L knee Pain Descriptors / Indicators: Aching, Sore Pain Intervention(s): Limited activity within patient's tolerance, Monitored during session, Premedicated before session, Ice  applied    Home Living                          Prior Function            PT Goals (current goals can now be found in the care plan section) Acute Rehab PT Goals Patient Stated Goal: Regain IND PT Goal Formulation: With patient Time For Goal Achievement: 10/25/21 Potential to Achieve Goals: Good Progress towards PT goals: Progressing toward goals    Frequency     7X/week      PT Plan Current plan remains appropriate    Co-evaluation              AM-PAC PT "6 Clicks" Mobility   Outcome Measure  Help needed turning from your back to your side while in a flat bed without using bedrails?: A Lot Help needed moving from lying on your back to sitting on the side of a flat bed without using bedrails?: A Little Help needed moving to and from a bed to a chair (including a wheelchair)?: A Little Help needed standing up from a chair using your arms (e.g., wheelchair or bedside chair)?: A Little Help needed to walk in hospital room?: A Little Help needed climbing 3-5 steps with a railing? : A Lot 6 Click Score: 16    End of Session Equipment Utilized During Treatment: Gait belt;Left knee immobilizer Activity Tolerance: Patient tolerated treatment well;Patient limited by pain Patient left: in chair;with call bell/phone within reach;with chair alarm set;with family/visitor present Nurse Communication: Mobility status PT Visit Diagnosis: Difficulty in walking, not elsewhere classified (R26.2)     Time: 9628-3662 PT Time Calculation (min) (ACUTE ONLY): 28 min  Charges:  $Gait Training: 8-22 mins $Therapeutic Exercise: 8-22 mins                     Debe Coder PT Acute Rehabilitation Services Pager [phone] Office [phone]    Joscelynn Brutus 10/19/2021, 1:03 PM

## 2021-10-19 NOTE — Discharge Summary (Signed)
Patient ID: Bridget [name] MRN: [id number] DOB/AGE: [birth year] 76 y.o.  Admit date: 10/18/2021 Discharge date: 10/19/2021  Admission Diagnoses:  Principal Problem:   Unilateral primary osteoarthritis, left knee Active Problems:   Status post total left knee replacement   Discharge Diagnoses:  Same  Past Medical History:  Diagnosis Date   Arthritis    Cancer ([place])    basal cell cancer removed on face   Hypertension    Seasonal allergies    Sleep apnea     Surgeries: Procedure(s): LEFT TOTAL KNEE ARTHROPLASTY on 10/18/2021   Consultants:   Discharged Condition: Improved  Hospital Course: ARNICE VANEPPS is an 76 y.o. female who was admitted 10/18/2021 for operative treatment ofUnilateral primary osteoarthritis, left knee. Patient has severe unremitting pain that affects sleep, daily activities, and work/hobbies. After pre-op clearance the patient was taken to the operating room on 10/18/2021 and underwent  Procedure(s): LEFT TOTAL KNEE ARTHROPLASTY.    Patient was given perioperative antibiotics:  Anti-infectives (From admission, onward)    Start     Dose/Rate Route Frequency Ordered Stop   10/18/21 1400  ceFAZolin (ANCEF) IVPB 1 g/50 mL premix        1 g 100 mL/hr over 30 Minutes Intravenous Every 6 hours 10/18/21 1052 10/18/21 2037   10/18/21 0600  ceFAZolin (ANCEF) IVPB 2g/100 mL premix        2 g 200 mL/hr over 30 Minutes Intravenous On call to O.R. 10/18/21 0526 10/18/21 0725        Patient was given sequential compression devices, early ambulation, and chemoprophylaxis to prevent DVT.  Patient benefited maximally from hospital stay and there were no complications.    Recent vital signs: Patient Vitals for the past 24 hrs:  BP Temp Temp src Pulse Resp SpO2  10/19/21 0931 (!) 115/59 98.5 F (36.9 C) Oral (!) 54 20 96 %  10/19/21 0428 (!) 127/58 97.9 F (36.6 C) Oral (!) 52 16 96 %  10/19/21 0132 120/61 98 F (36.7 C) Oral (!) 52 16 96 %  10/18/21  2131 121/61 97.9 F (36.6 C) Oral (!) 58 16 98 %  10/18/21 1729 136/65 97.9 F (36.6 C) Oral (!) 57 16 97 %  10/18/21 1257 (!) 140/66 97.8 F (36.6 C) Oral (!) 59 16 96 %     Recent laboratory studies:  Recent Labs    10/19/21 0314  WBC 14.6*  HGB 10.9*  HCT 33.7*  PLT 253  NA 137  K 4.1  CL 106  CO2 25  BUN 14  CREATININE 0.55  GLUCOSE 168*  CALCIUM 8.8*     Discharge Medications:   Allergies as of 10/19/2021       Reactions   Codeine Nausea Only        Medication List     TAKE these medications    benazepril 20 MG tablet Commonly known as: LOTENSIN TAKE 1 TABLET BY MOUTH EVERY DAY   benazepril-hydrochlorthiazide 20-25 MG tablet Commonly known as: LOTENSIN HCT Take 1 tablet by mouth daily.   calcium carbonate 1500 (600 Ca) MG Tabs tablet Commonly known as: OSCAL Take 600 mg of elemental calcium by mouth daily with breakfast.   cholecalciferol 25 MCG (1000 UNIT) tablet Commonly known as: VITAMIN D3 Take 1,000 Units by mouth daily.   diltiazem 240 MG 24 hr capsule Commonly known as: CARDIZEM CD TAKE 1 CAPSULE BY MOUTH EVERY DAY   Eliquis 5 MG Tabs tablet Generic drug: apixaban TAKE 1 TABLET BY MOUTH  TWICE A DAY   famotidine 20 MG tablet Commonly known as: PEPCID Take 20 mg by mouth daily as needed (acid reflux).   fluticasone 50 MCG/ACT nasal spray Commonly known as: FLONASE Place 1 spray into both nostrils daily as needed for allergies.   Klor-Con M20 20 MEQ tablet Generic drug: potassium chloride SA TAKE 1 TABLET BY MOUTH EVERY DAY   loratadine 10 MG tablet Commonly known as: CLARITIN Take 1 tablet by mouth daily as needed for allergies.   methocarbamol 500 MG tablet Commonly known as: ROBAXIN Take 1 tablet (500 mg total) by mouth every 8 (eight) hours as needed for muscle spasms.   multivitamin with minerals Tabs tablet Take 1 tablet by mouth daily.   ondansetron 4 MG tablet Commonly known as: ZOFRAN Take 1 tablet (4 mg  total) by mouth every 6 (six) hours as needed for nausea.   oxyCODONE 5 MG immediate release tablet Commonly known as: Oxy IR/ROXICODONE Take 1-2 tablets (5-10 mg total) by mouth every 4 (four) hours as needed for moderate pain (pain score 4-6).   pravastatin 20 MG tablet Commonly known as: PRAVACHOL Take 20 mg by mouth at bedtime.               Durable Medical Equipment  (From admission, onward)           Start     Ordered   10/18/21 1053  DME 3 n 1  Once        10/18/21 1052   10/18/21 1053  DME Walker rolling  Once       Question Answer Comment  Walker: With 5 Inch Wheels   Patient needs a walker to treat with the following condition Status post total left knee replacement      10/18/21 1052            Diagnostic Studies: DG Knee Left Port  Result Date: 10/18/2021 CLINICAL DATA:  Post left knee replacement EXAM: PORTABLE LEFT KNEE - 1-2 VIEW COMPARISON:  03/13/2021 FINDINGS: Changes of left knee replacement. No hardware or bony complicating feature. Soft tissue and joint space gas noted. IMPRESSION: Left knee replacement.  No visible complicating feature. Electronically Signed   By: Rolm Baptise M.D.   On: 10/18/2021 10:32   MM 3D SCREEN BREAST BILATERAL  Result Date: 10/02/2021 CLINICAL DATA:  Screening. EXAM: DIGITAL SCREENING BILATERAL MAMMOGRAM WITH TOMOSYNTHESIS AND CAD TECHNIQUE: Bilateral screening digital craniocaudal and mediolateral oblique mammograms were obtained. Bilateral screening digital breast tomosynthesis was performed. The images were evaluated with computer-aided detection. COMPARISON:  Previous exam(s). ACR Breast Density Category b: There are scattered areas of fibroglandular density. FINDINGS: There are no findings suspicious for malignancy. IMPRESSION: No mammographic evidence of malignancy. A result letter of this screening mammogram will be mailed directly to the patient. RECOMMENDATION: Screening mammogram in one year. (Code:[id number]-B-01Y)  BI-RADS CATEGORY  1: Negative. Electronically Signed   By: Ammie Ferrier M.D.   On: 10/02/2021 12:44    Disposition: Discharge disposition: 06-Home-Health Care Svc          Follow-up Information     Mcarthur Rossetti, MD Follow up in 2 week(s).   Specialty: Orthopedic Surgery Contact information: [address] [place] [address] [phone]                  Signed: Erskine Emery 10/19/2021, 11:16 AM

## 2021-10-19 NOTE — Progress Notes (Signed)
Physical Therapy Treatment Patient Details Name: Bridget [name] MRN: [id number] DOB: 05-12-1945 Today's Date: 10/19/2021   History of Present Illness Pt s/p L TKR    PT Comments    Pt continues very cooperative and progressing steadily with mobility. Pt up to ambulate limited distance in hall, negotiated stairs (bkwd), and reviewed bed mobility and don/doff KI.  Pt hopeful for dc home tomorrow.  Recommendations for follow up therapy are one component of a multi-disciplinary discharge planning process, led by the attending physician.  Recommendations may be updated based on patient status, additional functional criteria and insurance authorization.  Follow Up Recommendations  Follow physician's recommendations for discharge plan and follow up therapies     Assistance Recommended at Discharge Intermittent Supervision/Assistance  Patient can return home with the following A little help with walking and/or transfers;A little help with bathing/dressing/bathroom;Assistance with cooking/housework;Assist for transportation;Help with stairs or ramp for entrance   Equipment Recommendations  Rolling walker (2 wheels)    Recommendations for Other Services       Precautions / Restrictions Precautions Precautions: Knee;Fall Required Braces or Orthoses: Knee Immobilizer - Left Knee Immobilizer - Left: Discontinue once straight leg raise with < 10 degree lag Restrictions Weight Bearing Restrictions: No Other Position/Activity Restrictions: WBAT     Mobility  Bed Mobility Overal bed mobility: Needs Assistance Bed Mobility: Supine to Sit, Sit to Supine     Supine to sit: Min assist Sit to supine: Min assist   General bed mobility comments: Increased time with cues for sequence and use of R LE to self assist; physical assist to manage L LE    Transfers Overall transfer level: Needs assistance Equipment used: Rolling walker (2 wheels) Transfers: Sit to/from Stand Sit to Stand: Min  assist           General transfer comment: cues for LE management and use of UEs to self assist    Ambulation/Gait Ambulation/Gait assistance: Min assist, Min guard Gait Distance (Feet): 54 Feet Assistive device: Rolling walker (2 wheels) Gait Pattern/deviations: Step-to pattern, Decreased step length - right, Decreased step length - left, Shuffle, Trunk flexed Gait velocity: decr     General Gait Details: cues for sequence, posture and position from RW   Stairs Stairs: Yes Stairs assistance: Min assist Stair Management: No rails, Step to pattern, Backwards, With walker Number of Stairs: 3 General stair comments: single step 3x bkwd with cues for sequence and foot/RW placement   Wheelchair Mobility    Modified Rankin (Stroke Patients Only)       Balance Overall balance assessment: Needs assistance Sitting-balance support: Feet supported, No upper extremity supported Sitting balance-Leahy Scale: Good     Standing balance support: Bilateral upper extremity supported Standing balance-Leahy Scale: Poor                              Cognition Arousal/Alertness: Awake/alert Behavior During Therapy: WFL for tasks assessed/performed Overall Cognitive Status: Within Functional Limits for tasks assessed                                          Exercises Total Joint Exercises Ankle Circles/Pumps: AROM, Both, 15 reps, Supine Quad Sets: AROM, Both, 10 reps, Supine Heel Slides: AAROM, Left, 15 reps, Supine Hip ABduction/ADduction: AAROM, Left, 10 reps, Supine    General Comments  Pertinent Vitals/Pain Pain Assessment Pain Assessment: 0-10 Pain Score: 6  Pain Location: L knee Pain Descriptors / Indicators: Aching, Sore Pain Intervention(s): Limited activity within patient's tolerance, Monitored during session, Premedicated before session, Ice applied    Home Living                          Prior Function             PT Goals (current goals can now be found in the care plan section) Acute Rehab PT Goals Patient Stated Goal: Regain IND PT Goal Formulation: With patient Time For Goal Achievement: 10/25/21 Potential to Achieve Goals: Good Progress towards PT goals: Progressing toward goals    Frequency    7X/week      PT Plan Current plan remains appropriate    Co-evaluation              AM-PAC PT "6 Clicks" Mobility   Outcome Measure  Help needed turning from your back to your side while in a flat bed without using bedrails?: A Little Help needed moving from lying on your back to sitting on the side of a flat bed without using bedrails?: A Little Help needed moving to and from a bed to a chair (including a wheelchair)?: A Little Help needed standing up from a chair using your arms (e.g., wheelchair or bedside chair)?: A Little Help needed to walk in hospital room?: A Little Help needed climbing 3-5 steps with a railing? : A Little 6 Click Score: 18    End of Session Equipment Utilized During Treatment: Gait belt;Left knee immobilizer Activity Tolerance: Patient tolerated treatment well;Patient limited by pain Patient left: with call bell/phone within reach;with chair alarm set;with family/visitor present;in bed Nurse Communication: Mobility status PT Visit Diagnosis: Difficulty in walking, not elsewhere classified (R26.2)     Time: 1420-1505 PT Time Calculation (min) (ACUTE ONLY): 45 min  Charges:  $Gait Training: 8-22 mins $Therapeutic Activity: 8-22 mins                     Debe Coder PT Acute Rehabilitation Services Pager [phone] Office [phone]    Elkin Belfield 10/19/2021, 5:01 PM

## 2021-10-19 NOTE — Progress Notes (Signed)
Subjective: 1 Day Post-Op Procedure(s) (LRB): LEFT TOTAL KNEE ARTHROPLASTY (Left) Patient reports pain as moderate.  Progressing well with PT. No complaints.   Objective: Vital signs in last 24 hours: Temp:  [97.6 F (36.4 C)-98.5 F (36.9 C)] 98.5 F (36.9 C) (10/14 0931) Pulse Rate:  [52-59] 54 (10/14 0931) Resp:  [16-20] 20 (10/14 0931) BP: (98-140)/(58-66) 115/59 (10/14 0931) SpO2:  [88 %-98 %] 96 % (10/14 0931)  Intake/Output from previous day: 10/13 0701 - 10/14 0700 In: 3193.3 [P.O.:570; I.V.:2323.9; IV Piggyback:299.4] Out: 3650 [Urine:3600; Blood:50] Intake/Output this shift: Total I/O In: 240 [P.O.:240] Out: -   Recent Labs    10/19/21 0314  HGB 10.9*   Recent Labs    10/19/21 0314  WBC 14.6*  RBC 3.96  HCT 33.7*  PLT 253   Recent Labs    10/19/21 0314  NA 137  K 4.1  CL 106  CO2 25  BUN 14  CREATININE 0.55  GLUCOSE 168*  CALCIUM 8.8*   No results for input(s): "LABPT", "INR" in the last 72 hours.  Sensation intact distally Intact pulses distally Dorsiflexion/Plantar flexion intact Incision: dressing C/D/I Compartment soft   Assessment/Plan: 1 Day Post-Op Procedure(s) (LRB): LEFT TOTAL KNEE ARTHROPLASTY (Left) Up with therapy Discharge home with home health later today if remains appropriate for discharge  On chronic Eliquis     Louanne Calvillo 10/19/2021, 10:35 AM

## 2021-10-19 NOTE — TOC Transition Note (Signed)
Transition of Care [facility]) - CM/SW Discharge Note   Patient Details  Name: Bridget [name] MRN: [id number] Date of Birth: 1945-08-24  [facility] of [facility] CM/SW Contact:  Lennart Pall, LCSW Phone Number: 10/19/2021, 12:55 PM   Clinical Narrative:    Met with pt and confirming she has received RW to room via [facility].  HHPT prearranged with [facility].  No further TOC needs.   Final next level of care: [place] Barriers to Discharge: No Barriers Identified   Patient Goals and CMS Choice Patient states their goals for this hospitalization and ongoing recovery are:: return home      Discharge Placement                       Discharge Plan and Services                DME Arranged: Walker rolling DME Agency: [employer]       HH Arranged: PT [place] Agency: [facility]        Social Determinants of Health (SDOH) Interventions     Readmission Risk Interventions     No data to display

## 2021-10-19 NOTE — Plan of Care (Signed)
  Problem: Education: Goal: Knowledge of the prescribed therapeutic regimen will improve Outcome: Progressing   Problem: Activity: Goal: Ability to avoid complications of mobility impairment will improve Outcome: Progressing Goal: Range of joint motion will improve Outcome: Progressing   Problem: Clinical Measurements: Goal: Postoperative complications will be avoided or minimized Outcome: Progressing   Problem: Pain Management: Goal: Pain level will decrease with appropriate interventions Outcome: Progressing   Problem: Skin Integrity: Goal: Will show signs of wound healing Outcome: Progressing   Problem: Health Behavior/Discharge Planning: Goal: Ability to manage health-related needs will improve Outcome: Progressing   Problem: Clinical Measurements: Goal: Ability to maintain clinical measurements within normal limits will improve Outcome: Progressing Goal: Will remain free from infection Outcome: Progressing Goal: Diagnostic test results will improve Outcome: Progressing Goal: Respiratory complications will improve Outcome: Progressing Goal: Cardiovascular complication will be avoided Outcome: Progressing   Problem: Activity: Goal: Risk for activity intolerance will decrease Outcome: Progressing   Problem: Nutrition: Goal: Adequate nutrition will be maintained Outcome: Progressing   Problem: Coping: Goal: Level of anxiety will decrease Outcome: Progressing   Problem: Elimination: Goal: Will not experience complications related to bowel motility Outcome: Progressing Goal: Will not experience complications related to urinary retention Outcome: Progressing   Problem: Pain Managment: Goal: General experience of comfort will improve Outcome: Progressing   Problem: Safety: Goal: Ability to remain free from injury will improve Outcome: Progressing   Problem: Skin Integrity: Goal: Risk for impaired skin integrity will decrease Outcome: Progressing   

## 2021-10-19 NOTE — Discharge Instructions (Signed)

## 2021-10-20 DIAGNOSIS — Z85828 Personal history of other malignant neoplasm of skin: Secondary | ICD-10-CM | POA: Diagnosis not present

## 2021-10-20 DIAGNOSIS — M1712 Unilateral primary osteoarthritis, left knee: Secondary | ICD-10-CM | POA: Diagnosis not present

## 2021-10-20 DIAGNOSIS — Z79899 Other long term (current) drug therapy: Secondary | ICD-10-CM | POA: Diagnosis not present

## 2021-10-20 DIAGNOSIS — Z7901 Long term (current) use of anticoagulants: Secondary | ICD-10-CM | POA: Diagnosis not present

## 2021-10-20 DIAGNOSIS — I10 Essential (primary) hypertension: Secondary | ICD-10-CM | POA: Diagnosis not present

## 2021-10-20 NOTE — Discharge Summary (Signed)
Patient ID: Bridget [name] MRN: [id number] DOB/AGE: 1945-06-27 76 y.o.  Admit date: 10/18/2021 Discharge date: 10/20/2021  Admission Diagnoses:  Principal Problem:   Unilateral primary osteoarthritis, left knee Active Problems:   Status post total left knee replacement   Discharge Diagnoses:  Same  Past Medical History:  Diagnosis Date   Arthritis    Cancer ([place])    basal cell cancer removed on face   Hypertension    Seasonal allergies    Sleep apnea     Surgeries: Procedure(s): LEFT TOTAL KNEE ARTHROPLASTY on 10/18/2021   Consultants:   Discharged Condition: Improved  Hospital Course: Bridget [name] is an 76 y.o. female who was admitted 10/18/2021 for operative treatment ofUnilateral primary osteoarthritis, left knee. Patient has severe unremitting pain that affects sleep, daily activities, and work/hobbies. After pre-op clearance the patient was taken to the operating room on 10/18/2021 and underwent  Procedure(s): LEFT TOTAL KNEE ARTHROPLASTY.    Patient was given perioperative antibiotics:  Anti-infectives (From admission, onward)    Start     Dose/Rate Route Frequency Ordered Stop   10/18/21 1400  ceFAZolin (ANCEF) IVPB 1 g/50 mL premix        1 g 100 mL/hr over 30 Minutes Intravenous Every 6 hours 10/18/21 1052 10/18/21 2037   10/18/21 0600  ceFAZolin (ANCEF) IVPB 2g/100 mL premix        2 g 200 mL/hr over 30 Minutes Intravenous On call to O.R. 10/18/21 0526 10/18/21 0725        Patient was given sequential compression devices, early ambulation, and chemoprophylaxis to prevent DVT.  Patient benefited maximally from hospital stay and there were no complications.    Recent vital signs: Patient Vitals for the past 24 hrs:  BP Temp Temp src Pulse Resp SpO2  10/20/21 0509 (!) 120/56 98.3 F (36.8 C) Oral (!) 59 18 95 %  10/19/21 2140 (!) 124/56 98.8 F (37.1 C) Oral 63 18 96 %  10/19/21 1433 132/65 98.2 F (36.8 C) -- (!) 57 16 100 %  10/19/21 1237  136/65 98.1 F (36.7 C) Oral (!) 54 18 98 %  10/19/21 0931 (!) 115/59 98.5 F (36.9 C) Oral (!) 54 20 96 %     Recent laboratory studies:  Recent Labs    10/19/21 0314  WBC 14.6*  HGB 10.9*  HCT 33.7*  PLT 253  NA 137  K 4.1  CL 106  CO2 25  BUN 14  CREATININE 0.55  GLUCOSE 168*  CALCIUM 8.8*     Discharge Medications:   Allergies as of 10/20/2021       Reactions   Codeine Nausea Only        Medication List     TAKE these medications    benazepril 20 MG tablet Commonly known as: LOTENSIN TAKE 1 TABLET BY MOUTH EVERY DAY   benazepril-hydrochlorthiazide 20-25 MG tablet Commonly known as: LOTENSIN HCT Take 1 tablet by mouth daily.   calcium carbonate 1500 (600 Ca) MG Tabs tablet Commonly known as: OSCAL Take 600 mg of elemental calcium by mouth daily with breakfast.   cholecalciferol 25 MCG (1000 UNIT) tablet Commonly known as: VITAMIN D3 Take 1,000 Units by mouth daily.   diltiazem 240 MG 24 hr capsule Commonly known as: CARDIZEM CD TAKE 1 CAPSULE BY MOUTH EVERY DAY   Eliquis 5 MG Tabs tablet Generic drug: apixaban TAKE 1 TABLET BY MOUTH TWICE A DAY   famotidine 20 MG tablet Commonly known as: PEPCID Take 20  mg by mouth daily as needed (acid reflux).   fluticasone 50 MCG/ACT nasal spray Commonly known as: FLONASE Place 1 spray into both nostrils daily as needed for allergies.   Klor-Con M20 20 MEQ tablet Generic drug: potassium chloride SA TAKE 1 TABLET BY MOUTH EVERY DAY   loratadine 10 MG tablet Commonly known as: CLARITIN Take 1 tablet by mouth daily as needed for allergies.   methocarbamol 500 MG tablet Commonly known as: ROBAXIN Take 1 tablet (500 mg total) by mouth every 8 (eight) hours as needed for muscle spasms.   multivitamin with minerals Tabs tablet Take 1 tablet by mouth daily.   ondansetron 4 MG tablet Commonly known as: ZOFRAN Take 1 tablet (4 mg total) by mouth every 6 (six) hours as needed for nausea.   oxyCODONE  5 MG immediate release tablet Commonly known as: Oxy IR/ROXICODONE Take 1-2 tablets (5-10 mg total) by mouth every 4 (four) hours as needed for moderate pain (pain score 4-6).   pravastatin 20 MG tablet Commonly known as: PRAVACHOL Take 20 mg by mouth at bedtime.               Durable Medical Equipment  (From admission, onward)           Start     Ordered   10/18/21 1053  DME 3 n 1  Once        10/18/21 1052   10/18/21 1053  DME Walker rolling  Once       Question Answer Comment  Walker: With 5 Inch Wheels   Patient needs a walker to treat with the following condition Status post total left knee replacement      10/18/21 1052            Diagnostic Studies: DG Knee Left Port  Result Date: 10/18/2021 CLINICAL DATA:  Post left knee replacement EXAM: PORTABLE LEFT KNEE - 1-2 VIEW COMPARISON:  03/13/2021 FINDINGS: Changes of left knee replacement. No hardware or bony complicating feature. Soft tissue and joint space gas noted. IMPRESSION: Left knee replacement.  No visible complicating feature. Electronically Signed   By: Rolm Baptise M.D.   On: 10/18/2021 10:32   MM 3D SCREEN BREAST BILATERAL  Result Date: 10/02/2021 CLINICAL DATA:  Screening. EXAM: DIGITAL SCREENING BILATERAL MAMMOGRAM WITH TOMOSYNTHESIS AND CAD TECHNIQUE: Bilateral screening digital craniocaudal and mediolateral oblique mammograms were obtained. Bilateral screening digital breast tomosynthesis was performed. The images were evaluated with computer-aided detection. COMPARISON:  Previous exam(s). ACR Breast Density Category b: There are scattered areas of fibroglandular density. FINDINGS: There are no findings suspicious for malignancy. IMPRESSION: No mammographic evidence of malignancy. A result letter of this screening mammogram will be mailed directly to the patient. RECOMMENDATION: Screening mammogram in one year. (Code:SM-B-01Y) BI-RADS CATEGORY  1: Negative. Electronically Signed   By: Ammie Ferrier M.D.   On: 10/02/2021 12:44    Disposition: Discharge disposition: 01-Home or [place]     Mcarthur Rossetti, MD Follow up in 2 week(s).   Specialty: Orthopedic Surgery Contact information: [address] [place] [address] [place], [place] Follow up.   Specialty: [place] Why: to provide home physical therapy visits Contact information: [address] [place] [address] [phone]                  Signed: Mcarthur Rossetti  10/20/2021, 8:25 AM

## 2021-10-20 NOTE — Progress Notes (Signed)
Physical Therapy Treatment Patient Details Name: Bridget [name] MRN: [id number] DOB: September 22, 1945 Today's Date: 10/20/2021   History of Present Illness Pt s/p L TKR    PT Comments    Pt in good spirits with improvement in pain control and marked improvement in activity tolerance and level of assist required.  Pt up to ambulate increased distance in hall, negotiated stairs, reviewed bed mobility, reviewed don/doff KI and performed therex program with assist.  Pt's dtr and spouse present for full session.   Recommendations for follow up therapy are one component of a multi-disciplinary discharge planning process, led by the attending physician.  Recommendations may be updated based on patient status, additional functional criteria and insurance authorization.  Follow Up Recommendations  Follow physician's recommendations for discharge plan and follow up therapies     Assistance Recommended at Discharge Intermittent Supervision/Assistance  Patient can return home with the following A little help with walking and/or transfers;A little help with bathing/dressing/bathroom;Assistance with cooking/housework;Assist for transportation;Help with stairs or ramp for entrance   Equipment Recommendations  Rolling walker (2 wheels)    Recommendations for Other Services       Precautions / Restrictions Precautions Precautions: Knee;Fall Required Braces or Orthoses: Knee Immobilizer - Left Knee Immobilizer - Left: Discontinue once straight leg raise with < 10 degree lag Restrictions Weight Bearing Restrictions: No Other Position/Activity Restrictions: WBAT     Mobility  Bed Mobility Overal bed mobility: Needs Assistance Bed Mobility: Supine to Sit, Sit to Supine     Supine to sit: Supervision Sit to supine: Min assist   General bed mobility comments: Increased time with cues for sequence and use of R LE to self assist; physical assist to manage L LE    Transfers Overall transfer  level: Needs assistance Equipment used: Rolling walker (2 wheels) Transfers: Sit to/from Stand Sit to Stand: Min guard, Supervision           General transfer comment: cues for LE management and use of UEs to self assist    Ambulation/Gait Ambulation/Gait assistance: Min guard, Supervision Gait Distance (Feet): 95 Feet Assistive device: Rolling walker (2 wheels) Gait Pattern/deviations: Step-to pattern, Decreased step length - right, Decreased step length - left, Shuffle, Trunk flexed Gait velocity: decr     General Gait Details: cues for sequence, posture and position from RW   Stairs Stairs: Yes Stairs assistance: Min assist Stair Management: No rails, Step to pattern, Backwards, With walker, Forwards Number of Stairs: 3 General stair comments: single step 1x bkwd and 2x fwd with cues for sequence and foot/RW placement   Wheelchair Mobility    Modified Rankin (Stroke Patients Only)       Balance Overall balance assessment: Needs assistance Sitting-balance support: Feet supported, No upper extremity supported Sitting balance-Leahy Scale: Good     Standing balance support: Single extremity supported Standing balance-Leahy Scale: Poor                              Cognition Arousal/Alertness: Awake/alert Behavior During Therapy: WFL for tasks assessed/performed Overall Cognitive Status: Within Functional Limits for tasks assessed                                          Exercises Total Joint Exercises Ankle Circles/Pumps: AROM, Both, 15 reps, Supine Quad Sets: AROM, Both, 10 reps, Supine Heel Slides:  AAROM, Left, 15 reps, Supine Hip ABduction/ADduction: AAROM, Left, 10 reps, Supine    General Comments        Pertinent Vitals/Pain Pain Assessment Pain Assessment: 0-10 Pain Score: 4  Pain Location: L knee Pain Descriptors / Indicators: Aching, Sore Pain Intervention(s): Limited activity within patient's tolerance,  Monitored during session, Premedicated before session, Ice applied    Home Living                          Prior Function            PT Goals (current goals can now be found in the care plan section) Acute Rehab PT Goals Patient Stated Goal: Regain IND PT Goal Formulation: With patient Time For Goal Achievement: 10/25/21 Potential to Achieve Goals: Good Progress towards PT goals: Progressing toward goals    Frequency    7X/week      PT Plan Current plan remains appropriate    Co-evaluation              AM-PAC PT "6 Clicks" Mobility   Outcome Measure  Help needed turning from your back to your side while in a flat bed without using bedrails?: A Little Help needed moving from lying on your back to sitting on the side of a flat bed without using bedrails?: A Little Help needed moving to and from a bed to a chair (including a wheelchair)?: A Little Help needed standing up from a chair using your arms (e.g., wheelchair or bedside chair)?: A Little Help needed to walk in hospital room?: A Little Help needed climbing 3-5 steps with a railing? : A Little 6 Click Score: 18    End of Session Equipment Utilized During Treatment: Gait belt;Left knee immobilizer Activity Tolerance: Patient tolerated treatment well Patient left: with call bell/phone within reach;with chair alarm set;with family/visitor present;in bed Nurse Communication: Mobility status PT Visit Diagnosis: Difficulty in walking, not elsewhere classified (R26.2)     Time: 4332-9518 PT Time Calculation (min) (ACUTE ONLY): 35 min  Charges:  $Gait Training: 8-22 mins $Therapeutic Exercise: 8-22 mins                     Debe Coder PT Acute Rehabilitation Services Pager [phone] Office [phone]    Loral Campi 10/20/2021, 12:34 PM

## 2021-10-20 NOTE — Progress Notes (Signed)
Patient ID: Bridget [name], female   DOB: 11/18/1945, 76 y.o.   MRN: [id number] The patient actually needs to stay 1 more day for mobility purposes.  Her vital signs are stable this morning.  Her daughter is at bedside as well as her husband and states that already she is mobilized better this morning.  She feels better overall.  Her operative knee is stable.  Physical therapy plans to come by today to work with her.  She can be discharged home this afternoon if things are going well.

## 2021-10-20 NOTE — Progress Notes (Signed)
Patient discharged completed, was about to get her dressed when she vomited (med/green and yellow/mucus). Patient states she hasn't eating much today prior to pain medicine. Zofran already given.  Holding discharge for a bit to see if improves and if patient can tolerate food. Family at bedside and agreeable to plan.

## 2021-10-20 NOTE — Plan of Care (Signed)
  Problem: Education: Goal: Knowledge of the prescribed therapeutic regimen will improve Outcome: Progressing   Problem: Pain Management: Goal: Pain level will decrease with appropriate interventions Outcome: Progressing   Problem: Activity: Goal: Risk for activity intolerance will decrease Outcome: Progressing   

## 2021-10-20 NOTE — Progress Notes (Signed)
Patient rested and is no longer nauseated. Patient able to keep food down. Family and patient comfortable with discharging home now.

## 2021-10-21 ENCOUNTER — Telehealth: Payer: Self-pay | Admitting: *Deleted

## 2021-10-21 ENCOUNTER — Encounter (HOSPITAL_COMMUNITY): Payer: Self-pay | Admitting: Orthopaedic Surgery

## 2021-10-21 DIAGNOSIS — I10 Essential (primary) hypertension: Secondary | ICD-10-CM | POA: Diagnosis not present

## 2021-10-21 DIAGNOSIS — M5416 Radiculopathy, lumbar region: Secondary | ICD-10-CM | POA: Diagnosis not present

## 2021-10-21 DIAGNOSIS — I4891 Unspecified atrial fibrillation: Secondary | ICD-10-CM | POA: Diagnosis not present

## 2021-10-21 DIAGNOSIS — Z85828 Personal history of other malignant neoplasm of skin: Secondary | ICD-10-CM | POA: Diagnosis not present

## 2021-10-21 DIAGNOSIS — Z96652 Presence of left artificial knee joint: Secondary | ICD-10-CM | POA: Diagnosis not present

## 2021-10-21 DIAGNOSIS — Z7901 Long term (current) use of anticoagulants: Secondary | ICD-10-CM | POA: Diagnosis not present

## 2021-10-21 DIAGNOSIS — J302 Other seasonal allergic rhinitis: Secondary | ICD-10-CM | POA: Diagnosis not present

## 2021-10-21 DIAGNOSIS — M19012 Primary osteoarthritis, left shoulder: Secondary | ICD-10-CM | POA: Diagnosis not present

## 2021-10-21 DIAGNOSIS — Z471 Aftercare following joint replacement surgery: Secondary | ICD-10-CM | POA: Diagnosis not present

## 2021-10-21 DIAGNOSIS — Z9181 History of falling: Secondary | ICD-10-CM | POA: Diagnosis not present

## 2021-10-21 DIAGNOSIS — D6869 Other thrombophilia: Secondary | ICD-10-CM | POA: Diagnosis not present

## 2021-10-21 DIAGNOSIS — G4730 Sleep apnea, unspecified: Secondary | ICD-10-CM | POA: Diagnosis not present

## 2021-10-21 NOTE — Telephone Encounter (Signed)
Ortho bundle D/C call completed. 

## 2021-10-23 ENCOUNTER — Other Ambulatory Visit: Payer: Self-pay | Admitting: Orthopaedic Surgery

## 2021-10-23 ENCOUNTER — Telehealth: Payer: Self-pay | Admitting: *Deleted

## 2021-10-23 DIAGNOSIS — Z471 Aftercare following joint replacement surgery: Secondary | ICD-10-CM | POA: Diagnosis not present

## 2021-10-23 DIAGNOSIS — M19012 Primary osteoarthritis, left shoulder: Secondary | ICD-10-CM | POA: Diagnosis not present

## 2021-10-23 DIAGNOSIS — D6869 Other thrombophilia: Secondary | ICD-10-CM | POA: Diagnosis not present

## 2021-10-23 DIAGNOSIS — I4891 Unspecified atrial fibrillation: Secondary | ICD-10-CM | POA: Diagnosis not present

## 2021-10-23 DIAGNOSIS — I10 Essential (primary) hypertension: Secondary | ICD-10-CM | POA: Diagnosis not present

## 2021-10-23 DIAGNOSIS — M5416 Radiculopathy, lumbar region: Secondary | ICD-10-CM | POA: Diagnosis not present

## 2021-10-23 MED ORDER — TRAMADOL HCL 50 MG PO TABS: 50.0000 mg | ORAL_TABLET | Freq: Four times a day (QID) | ORAL | 0 refills | Status: DC | PRN

## 2021-10-23 MED ORDER — PROMETHAZINE HCL 12.5 MG PO TABS: 12.5000 mg | ORAL_TABLET | Freq: Three times a day (TID) | ORAL | 1 refills | Status: DC | PRN

## 2021-10-23 NOTE — Telephone Encounter (Signed)
Spoke with Jodi Mourning, NP (Patient's daughter). She had some concerns regarding therapy and how deconditioned her mother is. I spoke with therapy and agreed that "front-loading" her therapy this week through the weekend to have her seen daily is probably best and [facility] is able to do this. She is nauseated and Mickel Baas is concerned regarding the intolerance to Codeine and pain medication. She asked if we could drop her down a little to even maybe Tramadol to see how she does with this. We discussed the KI as well and I will review our conversation. Discussed constipation. She hasn't gone since before surgery, so I recommended OTC Mag Citrate (1/2 bottle today, then rest tomorrow if she needs). Would you recommend Tramadol or trying Norco? Thanks.

## 2021-10-24 DIAGNOSIS — M19012 Primary osteoarthritis, left shoulder: Secondary | ICD-10-CM | POA: Diagnosis not present

## 2021-10-24 DIAGNOSIS — M5416 Radiculopathy, lumbar region: Secondary | ICD-10-CM | POA: Diagnosis not present

## 2021-10-24 DIAGNOSIS — I10 Essential (primary) hypertension: Secondary | ICD-10-CM | POA: Diagnosis not present

## 2021-10-24 DIAGNOSIS — D6869 Other thrombophilia: Secondary | ICD-10-CM | POA: Diagnosis not present

## 2021-10-24 DIAGNOSIS — Z471 Aftercare following joint replacement surgery: Secondary | ICD-10-CM | POA: Diagnosis not present

## 2021-10-24 DIAGNOSIS — I4891 Unspecified atrial fibrillation: Secondary | ICD-10-CM | POA: Diagnosis not present

## 2021-10-25 DIAGNOSIS — I10 Essential (primary) hypertension: Secondary | ICD-10-CM | POA: Diagnosis not present

## 2021-10-25 DIAGNOSIS — D6869 Other thrombophilia: Secondary | ICD-10-CM | POA: Diagnosis not present

## 2021-10-25 DIAGNOSIS — Z471 Aftercare following joint replacement surgery: Secondary | ICD-10-CM | POA: Diagnosis not present

## 2021-10-25 DIAGNOSIS — I4891 Unspecified atrial fibrillation: Secondary | ICD-10-CM | POA: Diagnosis not present

## 2021-10-25 DIAGNOSIS — M19012 Primary osteoarthritis, left shoulder: Secondary | ICD-10-CM | POA: Diagnosis not present

## 2021-10-25 DIAGNOSIS — M5416 Radiculopathy, lumbar region: Secondary | ICD-10-CM | POA: Diagnosis not present

## 2021-10-25 NOTE — Therapy (Signed)
OUTPATIENT PHYSICAL THERAPY LOWER EXTREMITY EVALUATION   Patient Name: Bridget [name] MRN: [id number] DOB:14-Jun-1945, 76 y.o., female Today's Date: 10/31/2021   PT End of Session - 10/31/21 1635     Visit Number 1    Number of Visits 15    Progress Note Due on Visit 10    PT Start Time 6283    PT Stop Time 1430    PT Time Calculation (min) 45 min    Activity Tolerance Patient tolerated treatment well    Behavior During Therapy WFL for tasks assessed/performed             Past Medical History:  Diagnosis Date   Arthritis    Cancer ([place])    basal cell cancer removed on face   Hypertension    Seasonal allergies    Sleep apnea    Past Surgical History:  Procedure Laterality Date   BREAST EXCISIONAL BIOPSY Left    CHOLECYSTECTOMY  02/2010   KNEE SURGERY Right    Meniscal tear   OOPHORECTOMY  01/2003   RSO AT [facility]   TOTAL KNEE ARTHROPLASTY Left 10/18/2021   Procedure: LEFT TOTAL KNEE ARTHROPLASTY;  Surgeon: Mcarthur Rossetti, MD;  Location: [facility];  Service: Orthopedics;  Laterality: Left;   VAGINAL HYSTERECTOMY  01/2003   [facility],RSO A&P REPAIR   WISDOM TOOTH EXTRACTION     Patient Active Problem List   Diagnosis Date Noted   Status post total left knee replacement 10/18/2021   Unilateral primary osteoarthritis, left knee 07/31/2021   Degenerative arthritis of left knee 11/24/2019   Secondary hypercoagulable state ([place]) 01/26/2019   Greater trochanteric bursitis of right hip 09/23/2018   Lumbar radiculopathy 08/12/2018   Gluteal tendonitis of right buttock 07/29/2018   Atrial fibrillation ([place]) 07/21/2018   Arthritis of left acromioclavicular joint 04/26/2018   Partial nontraumatic tear of left rotator cuff 04/26/2018   Hypertension    Seasonal allergies     PCP: Jonathon Jordan, MD  REFERRING PROVIDER: Mcarthur Rossetti, MD  REFERRING DIAG: [phone] (ICD-10-CM) - Unilateral primary osteoarthritis, left knee   THERAPY DIAG:  Difficulty in  walking, not elsewhere classified  Localized edema  Muscle weakness (generalized)  Stiffness of left knee, not elsewhere classified  Left knee pain, unspecified chronicity  Rationale for Evaluation and Treatment Rehabilitation  ONSET DATE: Surgery 10/18/2021  SUBJECTIVE:   SUBJECTIVE STATEMENT: Bridget [name] is getting about 6 hours of sleep as long as she uses her pain medications.  She is most concerned about edema and her lack of flexion AROM.  PERTINENT HISTORY: OA, skin cancer, HTN, previous R knee scope, atrial fibrillation PAIN:  Are you having pain? Yes: NPRS scale: 4-7/10 Pain location: L knee Pain description: Stiff, ache, occasional sharp Aggravating factors: Prolonged postures or end range flexion Relieving factors: Movement, pain meds, ice  PRECAUTIONS: None  WEIGHT BEARING RESTRICTIONS: No  FALLS:  Has patient fallen in last 6 months? No  LIVING ENVIRONMENT: Lives with: lives with their spouse Lives in: House/apartment Stairs: 1 stair Has following equipment at home: Environmental consultant - 2 wheeled  OCCUPATION: Retired  PLOF: Independent  PATIENT GOALS: Return to gardening, visiting grand kids   OBJECTIVE:   DIAGNOSTIC FINDINGS: FINDINGS: Changes of left knee replacement. No hardware or bony complicating feature. Soft tissue and joint space gas noted.  PATIENT SURVEYS:  FOTO 38 (Goal 60 in 15 visits)  COGNITION: Overall cognitive status: Within functional limits for tasks assessed     SENSATION: No complaints of peripheral pain  or paresthesias  EDEMA:  Noted but not objectively assessed  IMPRESSION: S/P Left total knee arthroplasty on 10/18/21.  Eval and treat. Ortho bundle patient.  LOWER EXTREMITY ROM:  Active ROM Right eval Left eval  Hip flexion    Hip extension    Hip abduction    Hip adduction    Hip internal rotation    Hip external rotation    Knee flexion 112 40  Knee extension 0 -3  Ankle dorsiflexion    Ankle plantarflexion     Ankle inversion    Ankle eversion     (Blank rows = not tested)  LOWER EXTREMITY MMT:  MMT Right eval Left eval  Hip flexion    Hip extension    Hip abduction    Hip adduction    Hip internal rotation    Hip external rotation    Knee flexion    Knee extension 4/5 2/5  Ankle dorsiflexion    Ankle plantarflexion    Ankle inversion    Ankle eversion     (Blank rows = not tested)  GAIT: Distance walked: 50 feet Assistive device utilized: Environmental consultant - 2 wheeled Level of assistance:  Needs walker to reduce L knee WB Comments: Discussed the need for continued AD use to manage edema   TODAY'S TREATMENT                                                                          DATE: 10/31/2021 Quadriceps sets 2 sets of 10 for 5 seconds (toes back, press down and tighten thighs) Tailgate knee flexion 3 minutes Seated knee flexion AAROM 10X 10 seconds (R pushes L into flexion)  Vaso L knee Medium Pressure 34* for 10 minutes post-exercises  PATIENT EDUCATION:  Education details: Review exam findings, importance of edema control, ice and elevation and starter HEP Person educated: Patient Education method: Explanation, Demonstration, Tactile cues, Verbal cues, and Handouts Education comprehension: verbalized understanding, returned demonstration, verbal cues required, tactile cues required, and needs further education  HOME EXERCISE PROGRAM: Access Code: [email or web address] URL: [email or web address] Date: 10/31/2021 Prepared by: Vista Mink  Exercises - Supine Quadricep Sets  - 5 x daily - 7 x weekly - 2 sets - 10 reps - 5 second hold - Seated Knee Flexion AAROM  - 5 x daily - 7 x weekly - 1 sets - 1 reps - 3 minutes hold  ASSESSMENT:  CLINICAL IMPRESSION: Patient is a 76 y.o. female who was seen today for physical therapy evaluation and treatment for s/p Lt TKA 10/18/2021.  Her extension AROM is only limited by about 3 degrees by Lt knee edema.  Flexion AROM and  quadriceps strength will be the focus of early PT along with edema management.  Balance and more advanced gait and functional activities will be progressed as appropriate.  OBJECTIVE IMPAIRMENTS: Abnormal gait, decreased activity tolerance, decreased balance, decreased coordination, decreased endurance, decreased knowledge of condition, difficulty walking, decreased ROM, decreased strength, decreased safety awareness, increased edema, impaired perceived functional ability, obesity, and pain.   ACTIVITY LIMITATIONS: carrying, bending, sitting, standing, squatting, stairs, bed mobility, dressing, and locomotion level  PARTICIPATION LIMITATIONS: cleaning, driving, shopping, community activity, and yard work  PERSONAL FACTORS: OA, skin cancer, HTN, previous  R knee scope, atrial fibrillation are also affecting patient's functional outcome.   REHAB POTENTIAL: Good  CLINICAL DECISION MAKING: Stable/uncomplicated  EVALUATION COMPLEXITY: Low   GOALS: Goals reviewed with patient? Yes  SHORT TERM GOALS: Target date: 11/28/2021  Van will improve Lt knee flexion AROM to 90 degrees Baseline: 40 degrees Goal status: INITIAL  2.  Herman will be independent and compliant 5/7 days with her starter HEP for 2 weeks Baseline:  Goal status: INITIAL   LONG TERM GOALS: Target date: 01/09/2022   Improve FOTO to 60 Baseline: 38 Goal status: INITIAL  2.  Serita will report Lt knee pain consistently 3/10 or better on the VAS Baseline: Can be 7/10 Goal status: INITIAL  3.  Improve Lt knee AROM to 2-0-105 or better Baseline: 3-0-40 Goal status: INITIAL  4.  Improve Lt knee strength as assessed by MMT, hand-held dynamometer and transition to a cane or no AD Baseline: Walker and poor (2/5 MMT) strength Goal status: INITIAL  5.  Saffron will be compliant and independent with her long-term HEP at DC Baseline: Started 10/31/2021 Goal status: INITIAL   PLAN:  PT FREQUENCY:  2-3x/week  PT  DURATION: 10 weeks  PLANNED INTERVENTIONS: Therapeutic exercises, Therapeutic activity, Neuromuscular re-education, Balance training, Gait training, Patient/Family education, Self Care, Joint mobilization, Stair training, Electrical stimulation, Cryotherapy, Vasopneumatic device, and Manual therapy  PLAN FOR NEXT SESSION: Flexion AROM, quadriceps strength and edema control focus.   Farley Ly, PT, MPT 10/31/2021, 4:38 PM

## 2021-10-26 DIAGNOSIS — M5416 Radiculopathy, lumbar region: Secondary | ICD-10-CM | POA: Diagnosis not present

## 2021-10-26 DIAGNOSIS — M19012 Primary osteoarthritis, left shoulder: Secondary | ICD-10-CM | POA: Diagnosis not present

## 2021-10-26 DIAGNOSIS — Z471 Aftercare following joint replacement surgery: Secondary | ICD-10-CM | POA: Diagnosis not present

## 2021-10-26 DIAGNOSIS — D6869 Other thrombophilia: Secondary | ICD-10-CM | POA: Diagnosis not present

## 2021-10-26 DIAGNOSIS — I4891 Unspecified atrial fibrillation: Secondary | ICD-10-CM | POA: Diagnosis not present

## 2021-10-26 DIAGNOSIS — I10 Essential (primary) hypertension: Secondary | ICD-10-CM | POA: Diagnosis not present

## 2021-10-27 DIAGNOSIS — Z471 Aftercare following joint replacement surgery: Secondary | ICD-10-CM | POA: Diagnosis not present

## 2021-10-27 DIAGNOSIS — I10 Essential (primary) hypertension: Secondary | ICD-10-CM | POA: Diagnosis not present

## 2021-10-27 DIAGNOSIS — M19012 Primary osteoarthritis, left shoulder: Secondary | ICD-10-CM | POA: Diagnosis not present

## 2021-10-27 DIAGNOSIS — I4891 Unspecified atrial fibrillation: Secondary | ICD-10-CM | POA: Diagnosis not present

## 2021-10-27 DIAGNOSIS — M5416 Radiculopathy, lumbar region: Secondary | ICD-10-CM | POA: Diagnosis not present

## 2021-10-27 DIAGNOSIS — D6869 Other thrombophilia: Secondary | ICD-10-CM | POA: Diagnosis not present

## 2021-10-28 ENCOUNTER — Telehealth: Payer: Self-pay | Admitting: *Deleted

## 2021-10-28 NOTE — Telephone Encounter (Signed)
7 day call to patient's home. Spoke with her husband, who feels she is improving. No new needs at this time. Will continue to follow.

## 2021-10-29 DIAGNOSIS — M19012 Primary osteoarthritis, left shoulder: Secondary | ICD-10-CM | POA: Diagnosis not present

## 2021-10-29 DIAGNOSIS — I4891 Unspecified atrial fibrillation: Secondary | ICD-10-CM | POA: Diagnosis not present

## 2021-10-29 DIAGNOSIS — D6869 Other thrombophilia: Secondary | ICD-10-CM | POA: Diagnosis not present

## 2021-10-29 DIAGNOSIS — Z471 Aftercare following joint replacement surgery: Secondary | ICD-10-CM | POA: Diagnosis not present

## 2021-10-29 DIAGNOSIS — M5416 Radiculopathy, lumbar region: Secondary | ICD-10-CM | POA: Diagnosis not present

## 2021-10-29 DIAGNOSIS — I10 Essential (primary) hypertension: Secondary | ICD-10-CM | POA: Diagnosis not present

## 2021-10-30 DIAGNOSIS — D6869 Other thrombophilia: Secondary | ICD-10-CM | POA: Diagnosis not present

## 2021-10-30 DIAGNOSIS — M19012 Primary osteoarthritis, left shoulder: Secondary | ICD-10-CM | POA: Diagnosis not present

## 2021-10-30 DIAGNOSIS — I10 Essential (primary) hypertension: Secondary | ICD-10-CM | POA: Diagnosis not present

## 2021-10-30 DIAGNOSIS — Z471 Aftercare following joint replacement surgery: Secondary | ICD-10-CM | POA: Diagnosis not present

## 2021-10-30 DIAGNOSIS — M5416 Radiculopathy, lumbar region: Secondary | ICD-10-CM | POA: Diagnosis not present

## 2021-10-30 DIAGNOSIS — I4891 Unspecified atrial fibrillation: Secondary | ICD-10-CM | POA: Diagnosis not present

## 2021-10-31 ENCOUNTER — Ambulatory Visit (INDEPENDENT_AMBULATORY_CARE_PROVIDER_SITE_OTHER): Payer: Medicare Other | Admitting: Rehabilitative and Restorative Service Providers"

## 2021-10-31 ENCOUNTER — Ambulatory Visit (INDEPENDENT_AMBULATORY_CARE_PROVIDER_SITE_OTHER): Payer: Medicare Other | Admitting: Orthopaedic Surgery

## 2021-10-31 ENCOUNTER — Encounter: Payer: Self-pay | Admitting: Orthopaedic Surgery

## 2021-10-31 ENCOUNTER — Telehealth: Payer: Self-pay | Admitting: *Deleted

## 2021-10-31 ENCOUNTER — Encounter: Payer: Self-pay | Admitting: Rehabilitative and Restorative Service Providers"

## 2021-10-31 DIAGNOSIS — R600 Localized edema: Secondary | ICD-10-CM | POA: Diagnosis not present

## 2021-10-31 DIAGNOSIS — M25562 Pain in left knee: Secondary | ICD-10-CM

## 2021-10-31 DIAGNOSIS — M25662 Stiffness of left knee, not elsewhere classified: Secondary | ICD-10-CM | POA: Diagnosis not present

## 2021-10-31 DIAGNOSIS — M6281 Muscle weakness (generalized): Secondary | ICD-10-CM

## 2021-10-31 DIAGNOSIS — Z96652 Presence of left artificial knee joint: Secondary | ICD-10-CM

## 2021-10-31 DIAGNOSIS — R262 Difficulty in walking, not elsewhere classified: Secondary | ICD-10-CM

## 2021-10-31 MED ORDER — OXYCODONE HCL 5 MG PO TABS: 5.0000 mg | ORAL_TABLET | ORAL | 0 refills | Status: DC | PRN

## 2021-10-31 NOTE — Progress Notes (Signed)
The patient is here today for first postoperative visit status post a left total knee arthroplasty.  She is doing well.  She is on Eliquis is a blood thinner which she was on before surgery.  She has had home therapy coming to her house and is scheduled to start outpatient therapy today.  Sometimes she feels like there is a tight band around her knee but she does state to take the staples out today and taken the dressing off has been helpful.  Her calf is soft on her left side.  She can flex and extend her left foot and ankle.  Her left total knee is swollen to be expected.  The incision looks good.  Staples again have been removed and Steri-Strips applied.  Her extension is almost full but I can only flex her to maybe 70 to 80 degrees.  She will start outpatient physical therapy today.  I will see her back in 4 weeks to see how she is doing overall.  All questions and concerns were answered and addressed.  I will send in some oxycodone as well.

## 2021-10-31 NOTE — Telephone Encounter (Signed)
Ortho bundle 14 day in office meeting completed. °

## 2021-11-01 ENCOUNTER — Telehealth: Payer: Self-pay | Admitting: *Deleted

## 2021-11-01 ENCOUNTER — Other Ambulatory Visit: Payer: Self-pay | Admitting: Orthopaedic Surgery

## 2021-11-01 ENCOUNTER — Ambulatory Visit (INDEPENDENT_AMBULATORY_CARE_PROVIDER_SITE_OTHER): Payer: Medicare Other | Admitting: Rehabilitative and Restorative Service Providers"

## 2021-11-01 ENCOUNTER — Encounter: Payer: Self-pay | Admitting: Rehabilitative and Restorative Service Providers"

## 2021-11-01 DIAGNOSIS — R600 Localized edema: Secondary | ICD-10-CM

## 2021-11-01 DIAGNOSIS — M25562 Pain in left knee: Secondary | ICD-10-CM | POA: Diagnosis not present

## 2021-11-01 DIAGNOSIS — M6281 Muscle weakness (generalized): Secondary | ICD-10-CM

## 2021-11-01 DIAGNOSIS — R262 Difficulty in walking, not elsewhere classified: Secondary | ICD-10-CM | POA: Diagnosis not present

## 2021-11-01 DIAGNOSIS — M25662 Stiffness of left knee, not elsewhere classified: Secondary | ICD-10-CM | POA: Diagnosis not present

## 2021-11-01 MED ORDER — ONDANSETRON 4 MG PO TBDP: 4.0000 mg | ORAL_TABLET | Freq: Three times a day (TID) | ORAL | 0 refills | Status: DC | PRN

## 2021-11-01 NOTE — Therapy (Signed)
OUTPATIENT PHYSICAL THERAPY TREATMENT NOTE   Patient Name: Bridget [name] MRN: [id number] DOB:11-25-1945, 76 y.o., female Today's Date: 11/01/2021  END OF SESSION:   PT End of Session - 11/01/21 1514     Visit Number 2    Number of Visits 15    Progress Note Due on Visit 10    PT Start Time 7846    PT Stop Time 1525    PT Time Calculation (min) 52 min    Activity Tolerance Patient tolerated treatment well;Patient limited by pain    Behavior During Therapy [facility] for tasks assessed/performed             Past Medical History:  Diagnosis Date   Arthritis    Cancer ([place])    basal cell cancer removed on face   Hypertension    Seasonal allergies    Sleep apnea    Past Surgical History:  Procedure Laterality Date   BREAST EXCISIONAL BIOPSY Left    CHOLECYSTECTOMY  02/2010   KNEE SURGERY Right    Meniscal tear   OOPHORECTOMY  01/2003   RSO AT [facility]   TOTAL KNEE ARTHROPLASTY Left 10/18/2021   Procedure: LEFT TOTAL KNEE ARTHROPLASTY;  Surgeon: Mcarthur Rossetti, MD;  Location: [facility];  Service: Orthopedics;  Laterality: Left;   VAGINAL HYSTERECTOMY  01/2003   TVH,RSO A&P REPAIR   WISDOM TOOTH EXTRACTION     Patient Active Problem List   Diagnosis Date Noted   Status post total left knee replacement 10/18/2021   Unilateral primary osteoarthritis, left knee 07/31/2021   Degenerative arthritis of left knee 11/24/2019   Secondary hypercoagulable state ([place]) 01/26/2019   Greater trochanteric bursitis of right hip 09/23/2018   Lumbar radiculopathy 08/12/2018   Gluteal tendonitis of right buttock 07/29/2018   Atrial fibrillation ([place]) 07/21/2018   Arthritis of left acromioclavicular joint 04/26/2018   Partial nontraumatic tear of left rotator cuff 04/26/2018   Hypertension    Seasonal allergies      THERAPY DIAG:  Difficulty in walking, not elsewhere classified  Localized edema  Muscle weakness (generalized)  Stiffness of left knee, not elsewhere  classified  Left knee pain, unspecified chronicity  PCP: Jonathon Jordan, MD   REFERRING PROVIDER: Mcarthur Rossetti, MD   REFERRING DIAG: [phone] (ICD-10-CM) - Unilateral primary osteoarthritis, left knee    THERAPY DIAG:  Difficulty in walking, not elsewhere classified   Localized edema   Muscle weakness (generalized)   Stiffness of left knee, not elsewhere classified   Left knee pain, unspecified chronicity   Rationale for Evaluation and Treatment Rehabilitation   ONSET DATE: Surgery 10/18/2021   SUBJECTIVE:    SUBJECTIVE STATEMENT: Yocelyn reports and demonstrates good early HEP compliance.  She is most concerned about edema and her lack of flexion AROM.   PERTINENT HISTORY: OA, skin cancer, HTN, previous R knee scope, atrial fibrillation PAIN:  Are you having pain? Yes: NPRS scale: 4-7/10 Pain location: L knee Pain description: Stiff, ache, occasional sharp Aggravating factors: Prolonged postures or end range flexion Relieving factors: Movement, pain meds, ice   PRECAUTIONS: None   WEIGHT BEARING RESTRICTIONS: No   FALLS:  Has patient fallen in last 6 months? No   LIVING ENVIRONMENT: Lives with: lives with their spouse Lives in: House/apartment Stairs: 1 stair Has following equipment at home: Environmental consultant - 2 wheeled   OCCUPATION: Retired   PLOF: Independent   PATIENT GOALS: Return to gardening, visiting grand kids     OBJECTIVE:    DIAGNOSTIC  FINDINGS: FINDINGS: Changes of left knee replacement. No hardware or bony complicating feature. Soft tissue and joint space gas noted.   PATIENT SURVEYS:  FOTO 77 (Goal 60 in 15 visits)   COGNITION: Overall cognitive status: Within functional limits for tasks assessed                         SENSATION: No complaints of peripheral pain or paresthesias   EDEMA:  Noted but not objectively assessed   IMPRESSION: S/P Left total knee arthroplasty on 10/18/21.  Eval and treat. Ortho bundle patient.    LOWER EXTREMITY ROM:   Active ROM Right eval Left eval Left 11/01/2021  Hip flexion       Hip extension       Hip abduction       Hip adduction       Hip internal rotation       Hip external rotation       Knee flexion 112 40 50  Knee extension 0 -3 -3  Ankle dorsiflexion       Ankle plantarflexion       Ankle inversion       Ankle eversion        (Blank rows = not tested)   LOWER EXTREMITY MMT:   MMT Right eval Left eval  Hip flexion      Hip extension      Hip abduction      Hip adduction      Hip internal rotation      Hip external rotation      Knee flexion      Knee extension 4/5 2/5  Ankle dorsiflexion      Ankle plantarflexion      Ankle inversion      Ankle eversion       (Blank rows = not tested)   GAIT: Distance walked: 50 feet Assistive device utilized: Environmental consultant - 2 wheeled Level of assistance:  Needs walker to reduce L knee WB Comments: Discussed the need for continued AD use to manage edema     TODAY'S TREATMENT                                                                          DATE: 11/01/2021 Recumbent bike Seat 6 AAROM for 8 minutes Quadriceps sets 2 sets of 10 for 5 seconds (toes back, press down and tighten thighs) Tailgate knee flexion 2 minutes Seated knee flexion AAROM 10X 10 seconds (R pushes L into flexion)   Functional Activities for stairs and sit to stand: Double leg press 50# 15X slow eccentrics Single (Left) leg press 25# 10X slow eccentrics  Vaso L knee Medium Pressure 34* for 10 minutes post-exercises   10/31/2021 Quadriceps sets 2 sets of 10 for 5 seconds (toes back, press down and tighten thighs) Tailgate knee flexion 3 minutes Seated knee flexion AAROM 10X 10 seconds (R pushes L into flexion)   Vaso L knee Medium Pressure 34* for 10 minutes post-exercises   PATIENT EDUCATION:  Education details: Review exam findings, importance of edema control, ice and elevation and starter HEP Person educated:  Patient Education method: Explanation, Demonstration, Tactile cues, Verbal cues, and Handouts Education comprehension:  verbalized understanding, returned demonstration, verbal cues required, tactile cues required, and needs further education   HOME EXERCISE PROGRAM: Access Code: [email or web address] URL: [email or web address] Date: 10/31/2021 Prepared by: Vista Mink   Exercises - Supine Quadricep Sets  - 5 x daily - 7 x weekly - 2 sets - 10 reps - 5 second hold - Seated Knee Flexion AAROM  - 5 x daily - 7 x weekly - 1 sets - 1 reps - 3 minutes hold   ASSESSMENT:   CLINICAL IMPRESSION: Herbert is doing a good job with her early HEP.  Flexion AROM is 10 degrees better vs evaluation yesterday.  Edema control, quadriceps strength and flexion AROM remain the early focus with balance, gait and function as appropriate and as AROM improves.   OBJECTIVE IMPAIRMENTS: Abnormal gait, decreased activity tolerance, decreased balance, decreased coordination, decreased endurance, decreased knowledge of condition, difficulty walking, decreased ROM, decreased strength, decreased safety awareness, increased edema, impaired perceived functional ability, obesity, and pain.    ACTIVITY LIMITATIONS: carrying, bending, sitting, standing, squatting, stairs, bed mobility, dressing, and locomotion level   PARTICIPATION LIMITATIONS: cleaning, driving, shopping, community activity, and yard work   PERSONAL FACTORS: OA, skin cancer, HTN, previous R knee scope, atrial fibrillation are also affecting patient's functional outcome.    REHAB POTENTIAL: Good   CLINICAL DECISION MAKING: Stable/uncomplicated   EVALUATION COMPLEXITY: Low     GOALS: Goals reviewed with patient? Yes   SHORT TERM GOALS: Target date: 11/28/2021  Shonnie will improve Lt knee flexion AROM to 90 degrees Baseline: 40 degrees Goal status: On Going 11/01/2021   2.  Addisynn will be independent and compliant 5/7 days with her starter HEP  for 2 weeks Baseline:  Goal status: On Going 11/01/2021     LONG TERM GOALS: Target date: 01/09/2022    Improve FOTO to 60 Baseline: 38 Goal status: INITIAL   2.  Fina will report Lt knee pain consistently 3/10 or better on the VAS Baseline: Can be 7/10 Goal status: INITIAL   3.  Improve Lt knee AROM to 2-0-105 or better Baseline: 3-0-40 Goal status: INITIAL   4.  Improve Lt knee strength as assessed by MMT, hand-held dynamometer and transition to a cane or no AD Baseline: Walker and poor (2/5 MMT) strength Goal status: INITIAL   5.  Annalaura will be compliant and independent with her long-term HEP at DC Baseline: Started 10/31/2021 Goal status: INITIAL     PLAN:   PT FREQUENCY:  2-3x/week   PT DURATION: 10 weeks   PLANNED INTERVENTIONS: Therapeutic exercises, Therapeutic activity, Neuromuscular re-education, Balance training, Gait training, Patient/Family education, Self Care, Joint mobilization, Stair training, Electrical stimulation, Cryotherapy, Vasopneumatic device, and Manual therapy   PLAN FOR NEXT SESSION: Flexion AROM, quadriceps strength and edema control focus.        Farley Ly, PT, MPT 11/01/2021, 3:22 PM

## 2021-11-01 NOTE — Telephone Encounter (Signed)
Patient called requesting refill of Zofran- she has been using this for Nausea related to pain medication. Thank you.

## 2021-11-04 ENCOUNTER — Ambulatory Visit (INDEPENDENT_AMBULATORY_CARE_PROVIDER_SITE_OTHER): Payer: Medicare Other | Admitting: Rehabilitative and Restorative Service Providers"

## 2021-11-04 ENCOUNTER — Encounter: Payer: Self-pay | Admitting: Rehabilitative and Restorative Service Providers"

## 2021-11-04 DIAGNOSIS — M25562 Pain in left knee: Secondary | ICD-10-CM

## 2021-11-04 DIAGNOSIS — R600 Localized edema: Secondary | ICD-10-CM

## 2021-11-04 DIAGNOSIS — R262 Difficulty in walking, not elsewhere classified: Secondary | ICD-10-CM

## 2021-11-04 DIAGNOSIS — M6281 Muscle weakness (generalized): Secondary | ICD-10-CM

## 2021-11-04 DIAGNOSIS — M25662 Stiffness of left knee, not elsewhere classified: Secondary | ICD-10-CM | POA: Diagnosis not present

## 2021-11-04 NOTE — Therapy (Signed)
OUTPATIENT PHYSICAL THERAPY TREATMENT NOTE   Patient Name: Bridget [name] MRN: [id number] DOB:12-15-1945, 76 y.o., female Today's Date: 11/04/2021  END OF SESSION:   PT End of Session - 11/04/21 1310     Visit Number 3    Number of Visits 15    Progress Note Due on Visit 10    PT Start Time 1950    PT Stop Time 9326    PT Time Calculation (min) 49 min    Activity Tolerance Patient limited by pain    Behavior During Therapy [facility] for tasks assessed/performed              Past Medical History:  Diagnosis Date   Arthritis    Cancer ([place])    basal cell cancer removed on face   Hypertension    Seasonal allergies    Sleep apnea    Past Surgical History:  Procedure Laterality Date   BREAST EXCISIONAL BIOPSY Left    CHOLECYSTECTOMY  02/2010   KNEE SURGERY Right    Meniscal tear   OOPHORECTOMY  01/2003   [facility]O AT [facility]   TOTAL KNEE ARTHROPLASTY Left 10/18/2021   Procedure: LEFT TOTAL KNEE ARTHROPLASTY;  Surgeon: Mcarthur Rossetti, MD;  Location: [facility];  Service: Orthopedics;  Laterality: Left;   VAGINAL HYSTERECTOMY  01/2003   TVH,RSO A&P REPAIR   WISDOM TOOTH EXTRACTION     Patient Active Problem List   Diagnosis Date Noted   Status post total left knee replacement 10/18/2021   Unilateral primary osteoarthritis, left knee 07/31/2021   Degenerative arthritis of left knee 11/24/2019   Secondary hypercoagulable state ([place]) 01/26/2019   Greater trochanteric bursitis of right hip 09/23/2018   Lumbar radiculopathy 08/12/2018   Gluteal tendonitis of right buttock 07/29/2018   Atrial fibrillation ([place]) 07/21/2018   Arthritis of left acromioclavicular joint 04/26/2018   Partial nontraumatic tear of left rotator cuff 04/26/2018   Hypertension    Seasonal allergies      THERAPY DIAG:  Difficulty in walking, not elsewhere classified  Localized edema  Muscle weakness (generalized)  Stiffness of left knee, not elsewhere classified  Left knee pain, unspecified  chronicity  PCP: Bridget Jordan, MD   REFERRING PROVIDER: Mcarthur Rossetti, MD   REFERRING DIAG: [phone] (ICD-10-CM) - Unilateral primary osteoarthritis, left knee    THERAPY DIAG:  Difficulty in walking, not elsewhere classified   Localized edema   Muscle weakness (generalized)   Stiffness of left knee, not elsewhere classified   Left knee pain, unspecified chronicity   Rationale for Evaluation and Treatment Rehabilitation   ONSET DATE: Surgery 10/18/2021   SUBJECTIVE:    SUBJECTIVE STATEMENT: Pt indicated feeling pain around 6/10 and feeling extra stiff today.  Pt indicated walking around the house more this morning.     PERTINENT HISTORY: OA, skin cancer, HTN, previous R knee scope, atrial fibrillation PAIN:  NPRS scale: 6/10 Pain location: Lt knee Pain description: tightness, throbbing Aggravating factors: Prolonged postures or end range flexion Relieving factors: Movement, pain meds, ice   PRECAUTIONS: None   WEIGHT BEARING RESTRICTIONS: No   FALLS:  Has patient fallen in last 6 months? No   LIVING ENVIRONMENT: Lives with: lives with their spouse Lives in: House/apartment Stairs: 1 stair Has following equipment at home: Environmental consultant - 2 wheeled   OCCUPATION: Retired   PLOF: Independent   PATIENT GOALS: Return to gardening, visiting grand kids     OBJECTIVE:    DIAGNOSTIC FINDINGS:  10/31/2021 review of FINDINGS: Changes  of left knee replacement. No hardware or bony complicating feature. Soft tissue and joint space gas noted.   PATIENT SURVEYS:  10/31/2021 FOTO 38 (Goal 60 in 15 visits)   COGNITION: 10/31/2021 Overall cognitive status: Within functional limits for tasks assessed                         SENSATION: 10/31/2021 No complaints of peripheral pain or paresthesias   EDEMA:  10/31/2021 Noted but not objectively assessed     LOWER EXTREMITY ROM:   Active ROM Right 10/31/2021 Left 10/31/2021 Left 11/01/2021  Hip flexion        Hip extension       Hip abduction       Hip adduction       Hip internal rotation       Hip external rotation       Knee flexion 112 40 50  Knee extension 0 -3 -3  Ankle dorsiflexion       Ankle plantarflexion       Ankle inversion       Ankle eversion        (Blank rows = not tested)   LOWER EXTREMITY MMT:   MMT Right 10/31/2021 Left 10/31/2021  Hip flexion      Hip extension      Hip abduction      Hip adduction      Hip internal rotation      Hip external rotation      Knee flexion      Knee extension 4/5 2/5  Ankle dorsiflexion      Ankle plantarflexion      Ankle inversion      Ankle eversion       (Blank rows = not tested)   GAIT: 10/31/2021 Distance walked: 50 feet Assistive device utilized: Environmental consultant - 2 wheeled Level of assistance:  Needs walker to reduce L knee WB Comments: Discussed the need for continued AD use to manage edema     TODAY'S TREATMENT                                                                          DATE: 11/04/2021 Therex:  Nustep Lvl 5 8 mins UE/LE for range of motion (increased stiffness required nustep vs. Bike)  Seated Lt knee LAQ 2 x 10 c contralateral leg movement opposite and end range pauses each direction (cues for use at home routinely during day)  Seated quad set 5 sec hold x 10 on Lt leg  Leg press Lt knee 25 lbs x 15 in available range  Anterior/posterior weight shift in standing c mild HHA on walker x 15    Education given on sitting c knee flexed to promote motion gains vs. Keeping it in comfortable knee extension around 15/20 degrees.  Also educated on ankle pumps in elevation for swelling response.    Manual:  Lt knee flexion c distraction/IR mobilization c movement.  Contract/relax to Lt knee for flexion improvements.   Vaso  Lt knee Medium Pressure 34* for 10 minutes post-exercises  TODAY'S TREATMENT  DATE: 11/01/2021 Recumbent bike Seat  6 AAROM for 8 minutes Quadriceps sets 2 sets of 10 for 5 seconds (toes back, press down and tighten thighs) Tailgate knee flexion 2 minutes Seated knee flexion AAROM 10X 10 seconds (R pushes L into flexion)   Functional Activities for stairs and sit to stand: Double leg press 50# 15X slow eccentrics Single (Left) leg press 25# 10X slow eccentrics  Vaso L knee Medium Pressure 34* for 10 minutes post-exercises   TODAY'S TREATMENT                                                                          DATE: 10/31/2021 Quadriceps sets 2 sets of 10 for 5 seconds (toes back, press down and tighten thighs) Tailgate knee flexion 3 minutes Seated knee flexion AAROM 10X 10 seconds (R pushes L into flexion)   Vaso L knee Medium Pressure 34* for 10 minutes post-exercises   PATIENT EDUCATION:  11/04/2021 Education details: HEP update Person educated: Patient Education method: Explanation, Demonstration, Tactile cues, Verbal cues, and Handouts Education comprehension: verbalized understanding, returned demonstration, verbal cues required, tactile cues required   HOME EXERCISE PROGRAM: Access Code: [email or web address] URL: [email or web address] Date: 11/04/2021 Prepared by: Scot Jun  Exercises - Supine Quadricep Sets  - 5 x daily - 7 x weekly - 2 sets - 10 reps - 5 second hold - Seated Knee Flexion AAROM  - 5 x daily - 7 x weekly - 1 sets - 1 reps - 3 minutes hold - Seated Long Arc Quad (Mirrored)  - 3-5 x daily - 7 x weekly - 1-2 sets - 10 reps - 2 hold - Ankle Pumps in Elevation  - 2-3 x daily - 7 x weekly - 1-2 sets - 10 reps   ASSESSMENT:   CLINICAL IMPRESSION: Lt knee flexion continued to be drastically limited.  Quad restriction and guarding noted as first restrictive limitation for flexion at this time c pain.  Encouraged consistent and frequent mobility during the day with small doses of movement as well as increased use of elevation and muscle pump activation for swelling  reduction to promote improvements.    OBJECTIVE IMPAIRMENTS: Abnormal gait, decreased activity tolerance, decreased balance, decreased coordination, decreased endurance, decreased knowledge of condition, difficulty walking, decreased ROM, decreased strength, decreased safety awareness, increased edema, impaired perceived functional ability, obesity, and pain.    ACTIVITY LIMITATIONS: carrying, bending, sitting, standing, squatting, stairs, bed mobility, dressing, and locomotion level   PARTICIPATION LIMITATIONS: cleaning, driving, shopping, community activity, and yard work   PERSONAL FACTORS: OA, skin cancer, HTN, previous Rt knee scope, atrial fibrillation are also affecting patient's functional outcome.    REHAB POTENTIAL: Good   CLINICAL DECISION MAKING: Stable/uncomplicated   EVALUATION COMPLEXITY: Low     GOALS: Goals reviewed with patient? Yes   SHORT TERM GOALS: Target date: 11/28/2021  Mikhala will improve Lt knee flexion AROM to 90 degrees Baseline: 40 degrees Goal status: On Going 11/01/2021   2.  Eldine will be independent and compliant 5/7 days with her starter HEP for 2 weeks Baseline:  Goal status: On Going 11/01/2021     LONG TERM GOALS: Target date: 01/09/2022    Improve FOTO to 60 Baseline: 38 Goal status:  INITIAL   2.  Salvador will report Lt knee pain consistently 3/10 or better on the VAS Baseline: Can be 7/10 Goal status: INITIAL   3.  Improve Lt knee AROM to 2-0-105 or better Baseline: 3-0-40 Goal status: INITIAL   4.  Improve Lt knee strength as assessed by MMT, hand-held dynamometer and transition to a cane or no AD Baseline: Walker and poor (2/5 MMT) strength Goal status: INITIAL   5.  Vianna will be compliant and independent with her long-term HEP at DC Baseline: Started 10/31/2021 Goal status: INITIAL     PLAN:   PT FREQUENCY:  2-3x/week   PT DURATION: 10 weeks   PLANNED INTERVENTIONS: Therapeutic exercises, Therapeutic activity,  Neuromuscular re-education, Balance training, Gait training, Patient/Family education, Self Care, Joint mobilization, Stair training, Electrical stimulation, Cryotherapy, Vasopneumatic device, and Manual therapy   PLAN FOR NEXT SESSION: Encourage manual therapy for pain relief/mobility gains, early static balance and quad strengthening (watch edema response).   Pt may benefit from supine LAQ for ROM/muscle pump as able.   Scot Jun, PT, DPT, OCS, ATC 11/04/21  1:52 PM

## 2021-11-06 ENCOUNTER — Ambulatory Visit (INDEPENDENT_AMBULATORY_CARE_PROVIDER_SITE_OTHER): Payer: Medicare Other | Admitting: Physical Therapy

## 2021-11-06 ENCOUNTER — Encounter: Payer: Self-pay | Admitting: Physical Therapy

## 2021-11-06 DIAGNOSIS — M6281 Muscle weakness (generalized): Secondary | ICD-10-CM | POA: Diagnosis not present

## 2021-11-06 DIAGNOSIS — R600 Localized edema: Secondary | ICD-10-CM | POA: Diagnosis not present

## 2021-11-06 DIAGNOSIS — M25662 Stiffness of left knee, not elsewhere classified: Secondary | ICD-10-CM | POA: Diagnosis not present

## 2021-11-06 DIAGNOSIS — M25562 Pain in left knee: Secondary | ICD-10-CM | POA: Diagnosis not present

## 2021-11-06 DIAGNOSIS — R262 Difficulty in walking, not elsewhere classified: Secondary | ICD-10-CM | POA: Diagnosis not present

## 2021-11-06 NOTE — Therapy (Signed)
OUTPATIENT PHYSICAL THERAPY TREATMENT NOTE   Patient Name: Bridget [name] MRN: [id number] DOB:[birth year], 76 y.o., female Today's Date: 11/06/2021  END OF SESSION:   PT End of Session - 11/06/21 1414     Visit Number 4    Number of Visits 15    Progress Note Due on Visit 10    PT Start Time 9628    PT Stop Time 1428    PT Time Calculation (min) 40 min    Activity Tolerance Patient limited by pain    Behavior During Therapy [facility] for tasks assessed/performed               Past Medical History:  Diagnosis Date   Arthritis    Cancer ([place])    basal cell cancer removed on face   Hypertension    Seasonal allergies    Sleep apnea    Past Surgical History:  Procedure Laterality Date   BREAST EXCISIONAL BIOPSY Left    CHOLECYSTECTOMY  02/2010   KNEE SURGERY Right    Meniscal tear   OOPHORECTOMY  01/2003   RSO AT [facility]   TOTAL KNEE ARTHROPLASTY Left 10/18/2021   Procedure: LEFT TOTAL KNEE ARTHROPLASTY;  Surgeon: Mcarthur Rossetti, MD;  Location: [facility];  Service: Orthopedics;  Laterality: Left;   VAGINAL HYSTERECTOMY  01/2003   TVH,RSO A&P REPAIR   WISDOM TOOTH EXTRACTION     Patient Active Problem List   Diagnosis Date Noted   Status post total left knee replacement 10/18/2021   Unilateral primary osteoarthritis, left knee 07/31/2021   Degenerative arthritis of left knee 11/24/2019   Secondary hypercoagulable state ([place]) 01/26/2019   Greater trochanteric bursitis of right hip 09/23/2018   Lumbar radiculopathy 08/12/2018   Gluteal tendonitis of right buttock 07/29/2018   Atrial fibrillation ([place]) 07/21/2018   Arthritis of left acromioclavicular joint 04/26/2018   Partial nontraumatic tear of left rotator cuff 04/26/2018   Hypertension    Seasonal allergies      THERAPY DIAG:  Difficulty in walking, not elsewhere classified  Localized edema  Muscle weakness (generalized)  Stiffness of left knee, not elsewhere classified  Left knee pain,  unspecified chronicity  PCP: Jonathon Jordan, MD   REFERRING PROVIDER: Mcarthur Rossetti, MD   REFERRING DIAG: [phone] (ICD-10-CM) - Unilateral primary osteoarthritis, left knee    THERAPY DIAG:  Difficulty in walking, not elsewhere classified   Localized edema   Muscle weakness (generalized)   Stiffness of left knee, not elsewhere classified   Left knee pain, unspecified chronicity   Rationale for Evaluation and Treatment Rehabilitation   ONSET DATE: Surgery 10/18/2021   SUBJECTIVE:    SUBJECTIVE STATEMENT:  I'm just OK today. I felt pretty good and I was doing leg swings almost every hour, this morning my leg was swollen and aching. I have been on Eliquis since 2020. My ankle seems extra swollen today.   PERTINENT HISTORY: OA, skin cancer, HTN, previous R knee scope, atrial fibrillation PAIN:  NPRS scale: 6/10 Pain location: Lt knee Pain description: tightness, throbbing, aching  Aggravating factors: standing, colder weather  Relieving factors: ice, elevation    PRECAUTIONS: None   WEIGHT BEARING RESTRICTIONS: No   FALLS:  Has patient fallen in last 6 months? No   LIVING ENVIRONMENT: Lives with: lives with their spouse Lives in: House/apartment Stairs: 1 stair Has following equipment at home: Gilford Rile - 2 wheeled   OCCUPATION: Retired   PLOF: Independent   PATIENT GOALS: Return to gardening, visiting grand kids  OBJECTIVE:    DIAGNOSTIC FINDINGS:  10/31/2021 review of FINDINGS: Changes of left knee replacement. No hardware or bony complicating feature. Soft tissue and joint space gas noted.   PATIENT SURVEYS:  10/31/2021 FOTO 38 (Goal 60 in 15 visits)   COGNITION: 10/31/2021 Overall cognitive status: Within functional limits for tasks assessed                         SENSATION: 10/31/2021 No complaints of peripheral pain or paresthesias   EDEMA:  10/31/2021 Noted but not objectively assessed     LOWER EXTREMITY ROM:   Active  ROM Right 10/31/2021 Left 10/31/2021 Left 11/01/2021  Hip flexion       Hip extension       Hip abduction       Hip adduction       Hip internal rotation       Hip external rotation       Knee flexion 112 40 50  Knee extension 0 -3 -3  Ankle dorsiflexion       Ankle plantarflexion       Ankle inversion       Ankle eversion        (Blank rows = not tested)   LOWER EXTREMITY MMT:   MMT Right 10/31/2021 Left 10/31/2021  Hip flexion      Hip extension      Hip abduction      Hip adduction      Hip internal rotation      Hip external rotation      Knee flexion      Knee extension 4/5 2/5  Ankle dorsiflexion      Ankle plantarflexion      Ankle inversion      Ankle eversion       (Blank rows = not tested)   GAIT: 10/31/2021 Distance walked: 50 feet Assistive device utilized: Environmental consultant - 2 wheeled Level of assistance:  Needs walker to reduce L knee WB Comments: Discussed the need for continued AD use to manage edema         TODAY'S TREATMENT                                                                          DATE:     11/06/21  Manual  Retrograde massage with LEs elevated for edema control  Tennis ball STM with LEs elevated, great release of mm spasms/improvement in pain Brief patella mobs grade III all directions, mobility not too bad  TherEx  Supine quad sets x20 mod cues for quad activation instead of glutes Supine knee flexion AAROM- attempted but unable to tolerate in supine Seated self knee flexion AAROM x10       11/04/2021 Therex:  Nustep Lvl 5 8 mins UE/LE for range of motion (increased stiffness required nustep vs. Bike)  Seated Lt knee LAQ 2 x 10 c contralateral leg movement opposite and end range pauses each direction (cues for use at home routinely during day)  Seated quad set 5 sec hold x 10 on Lt leg  Leg press Lt knee 25 lbs x 15 in available range  Anterior/posterior weight shift in standing c mild HHA on  walker x  15    Education given on sitting c knee flexed to promote motion gains vs. Keeping it in comfortable knee extension around 15/20 degrees.  Also educated on ankle pumps in elevation for swelling response.    Manual:  Lt knee flexion c distraction/IR mobilization c movement.  Contract/relax to Lt knee for flexion improvements.   Vaso  Lt knee Medium Pressure 34* for 10 minutes post-exercises  TODAY'S TREATMENT                                                                          DATE: 11/01/2021 Recumbent bike Seat 6 AAROM for 8 minutes Quadriceps sets 2 sets of 10 for 5 seconds (toes back, press down and tighten thighs) Tailgate knee flexion 2 minutes Seated knee flexion AAROM 10X 10 seconds (R pushes L into flexion)   Functional Activities for stairs and sit to stand: Double leg press 50# 15X slow eccentrics Single (Left) leg press 25# 10X slow eccentrics  Vaso L knee Medium Pressure 34* for 10 minutes post-exercises   TODAY'S TREATMENT                                                                          DATE:       10/31/2021 Quadriceps sets 2 sets of 10 for 5 seconds (toes back, press down and tighten thighs) Tailgate knee flexion 3 minutes Seated knee flexion AAROM 10X 10 seconds (R pushes L into flexion)   Vaso L knee Medium Pressure 34* for 10 minutes post-exercises   PATIENT EDUCATION:  11/04/2021 Education details: HEP update Person educated: Patient Education method: Explanation, Demonstration, Tactile cues, Verbal cues, and Handouts Education comprehension: verbalized understanding, returned demonstration, verbal cues required, tactile cues required   HOME EXERCISE PROGRAM: Access Code: [email or web address] URL: [email or web address] Date: 11/04/2021 Prepared by: Scot Jun  Exercises - Supine Quadricep Sets  - 5 x daily - 7 x weekly - 2 sets - 10 reps - 5 second hold - Seated Knee Flexion AAROM  - 5 x daily - 7 x weekly - 1 sets - 1 reps - 3  minutes hold - Seated Long Arc Quad (Mirrored)  - 3-5 x daily - 7 x weekly - 1-2 sets - 10 reps - 2 hold - Ankle Pumps in Elevation  - 2-3 x daily - 7 x weekly - 1-2 sets - 10 reps   ASSESSMENT:   CLINICAL IMPRESSION:  Malachy Moan arrives today doing OK, feels she may have overdone it yesterday as she was feeling quite well but having more pain and edema today. Focused today's session on manual techniques for edema control, pain, ROM, and mm spasm management. Responded really well to manual techniques with significant improvement in pain. Encouraged her to discuss possible compression wraps vs lymphedema therapy with cardiologist, this may be beneficial if she can get medically cleared. Will continue to progress as able  Pain improved to 3/10 at EOS after manual techniques.  OBJECTIVE IMPAIRMENTS: Abnormal gait, decreased activity tolerance, decreased balance, decreased coordination, decreased endurance, decreased knowledge of condition, difficulty walking, decreased ROM, decreased strength, decreased safety awareness, increased edema, impaired perceived functional ability, obesity, and pain.    ACTIVITY LIMITATIONS: carrying, bending, sitting, standing, squatting, stairs, bed mobility, dressing, and locomotion level   PARTICIPATION LIMITATIONS: cleaning, driving, shopping, community activity, and yard work   PERSONAL FACTORS: OA, skin cancer, HTN, previous Rt knee scope, atrial fibrillation are also affecting patient's functional outcome.    REHAB POTENTIAL: Good   CLINICAL DECISION MAKING: Stable/uncomplicated   EVALUATION COMPLEXITY: Low     GOALS: Goals reviewed with patient? Yes   SHORT TERM GOALS: Target date: 11/28/2021  Amarri will improve Lt knee flexion AROM to 90 degrees Baseline: 40 degrees Goal status: On Going 11/01/2021   2.  Ndidi will be independent and compliant 5/7 days with her starter HEP for 2 weeks Baseline:  Goal status: On Going 11/01/2021     LONG TERM  GOALS: Target date: 01/09/2022    Improve FOTO to 60 Baseline: 38 Goal status: INITIAL   2.  Jamesina will report Lt knee pain consistently 3/10 or better on the VAS Baseline: Can be 7/10 Goal status: INITIAL   3.  Improve Lt knee AROM to 2-0-105 or better Baseline: 3-0-40 Goal status: INITIAL   4.  Improve Lt knee strength as assessed by MMT, hand-held dynamometer and transition to a cane or no AD Baseline: Walker and poor (2/5 MMT) strength Goal status: INITIAL   5.  Chalsey will be compliant and independent with her long-term HEP at DC Baseline: Started 10/31/2021 Goal status: INITIAL     PLAN:   PT FREQUENCY:  2-3x/week   PT DURATION: 10 weeks   PLANNED INTERVENTIONS: Therapeutic exercises, Therapeutic activity, Neuromuscular re-education, Balance training, Gait training, Patient/Family education, Self Care, Joint mobilization, Stair training, Electrical stimulation, Cryotherapy, Vasopneumatic device, and Manual therapy   PLAN FOR NEXT SESSION: Encourage manual therapy for pain relief/mobility gains, early static balance and quad strengthening (watch edema response).   Pt may benefit from supine LAQ for ROM/muscle pump as able.    Ann Lions PT DPT PN2  11/06/2021, 2:28 PM

## 2021-11-08 ENCOUNTER — Ambulatory Visit (INDEPENDENT_AMBULATORY_CARE_PROVIDER_SITE_OTHER): Payer: Medicare Other | Admitting: Rehabilitative and Restorative Service Providers"

## 2021-11-08 ENCOUNTER — Encounter: Payer: Self-pay | Admitting: Rehabilitative and Restorative Service Providers"

## 2021-11-08 ENCOUNTER — Telehealth: Payer: Self-pay | Admitting: Cardiology

## 2021-11-08 DIAGNOSIS — M6281 Muscle weakness (generalized): Secondary | ICD-10-CM

## 2021-11-08 DIAGNOSIS — M25562 Pain in left knee: Secondary | ICD-10-CM | POA: Diagnosis not present

## 2021-11-08 DIAGNOSIS — R262 Difficulty in walking, not elsewhere classified: Secondary | ICD-10-CM | POA: Diagnosis not present

## 2021-11-08 DIAGNOSIS — M25662 Stiffness of left knee, not elsewhere classified: Secondary | ICD-10-CM | POA: Diagnosis not present

## 2021-11-08 DIAGNOSIS — R600 Localized edema: Secondary | ICD-10-CM | POA: Diagnosis not present

## 2021-11-08 NOTE — Telephone Encounter (Signed)
Back in June she saw Dr. Radford Pax. Says doctor mentioned that her BLEE could be r/t Diltiazem and could add diuretic to take as needed. Pt reports recent knee replacement and the swelling is causing set backs in being able to not move the legs as easily. She is asking if Dr. Radford Pax would prescribe the diuretic as discussed many months ago. Aware will forward to her for advisement. Aware it may be next week before office calls back with her recommendation/s. Patient verbalized understanding and agreeable to plan.

## 2021-11-08 NOTE — Telephone Encounter (Signed)
Pt c/o swelling: STAT is pt has developed SOB within 24 hours  How much weight have you gained and in what time span? Not sure   If swelling, where is the swelling located? Lower legs   Are you currently taking a fluid pill? No   Are you currently SOB? No  Do you have a log of your daily weights (if so, list)? No   Have you gained 3 pounds in a day or 5 pounds in a week? No   Have you traveled recently? No  Pt is calling in regards to medication she had discussed at her last visit.

## 2021-11-08 NOTE — Telephone Encounter (Signed)
Pt confirms she is taking Lotensin. She also confirms it is pitting edema. Will forward back  to MD for advisement.  (Pt aware she may switch her to another diuretic, and stop the HCTZ)

## 2021-11-08 NOTE — Therapy (Signed)
OUTPATIENT PHYSICAL THERAPY TREATMENT NOTE   Patient Name: Bridget [name] MRN: [id number] DOB:02/16/1945, 76 y.o., female Today's Date: 11/08/2021  END OF SESSION:    Past Medical History:  Diagnosis Date   Arthritis    Cancer ([place])    basal cell cancer removed on face   Hypertension    Seasonal allergies    Sleep apnea    Past Surgical History:  Procedure Laterality Date   BREAST EXCISIONAL BIOPSY Left    CHOLECYSTECTOMY  02/2010   KNEE SURGERY Right    Meniscal tear   OOPHORECTOMY  01/2003   RSO AT [facility]   TOTAL KNEE ARTHROPLASTY Left 10/18/2021   Procedure: LEFT TOTAL KNEE ARTHROPLASTY;  Surgeon: Mcarthur Rossetti, MD;  Location: [facility];  Service: Orthopedics;  Laterality: Left;   VAGINAL HYSTERECTOMY  01/2003   TVH,RSO A&P REPAIR   WISDOM TOOTH EXTRACTION     Patient Active Problem List   Diagnosis Date Noted   Status post total left knee replacement 10/18/2021   Unilateral primary osteoarthritis, left knee 07/31/2021   Degenerative arthritis of left knee 11/24/2019   Secondary hypercoagulable state ([place]) 01/26/2019   Greater trochanteric bursitis of right hip 09/23/2018   Lumbar radiculopathy 08/12/2018   Gluteal tendonitis of right buttock 07/29/2018   Atrial fibrillation ([place]) 07/21/2018   Arthritis of left acromioclavicular joint 04/26/2018   Partial nontraumatic tear of left rotator cuff 04/26/2018   Hypertension    Seasonal allergies      THERAPY DIAG:  Difficulty in walking, not elsewhere classified  Localized edema  Muscle weakness (generalized)  Stiffness of left knee, not elsewhere classified  Left knee pain, unspecified chronicity  PCP: Jonathon Jordan, MD   REFERRING PROVIDER: Mcarthur Rossetti, MD   REFERRING DIAG: [phone] (ICD-10-CM) - Unilateral primary osteoarthritis, left knee    THERAPY DIAG:  Difficulty in walking, not elsewhere classified   Localized edema   Muscle weakness (generalized)   Stiffness of left  knee, not elsewhere classified   Left knee pain, unspecified chronicity   Rationale for Evaluation and Treatment Rehabilitation   ONSET DATE: Surgery 10/18/2021   SUBJECTIVE:    SUBJECTIVE STATEMENT: Bridget [name] reports she is sleeping well.  "So-so" HEP compliance.  PERTINENT HISTORY: OA, skin cancer, HTN, previous R knee scope, atrial fibrillation PAIN:  NPRS scale: 3-5/10 this week Pain location: Lt knee Pain description: tightness, throbbing, aching  Aggravating factors: standing, colder weather  Relieving factors: ice, elevation    PRECAUTIONS: None   WEIGHT BEARING RESTRICTIONS: No   FALLS:  Has patient fallen in last 6 months? No   LIVING ENVIRONMENT: Lives with: lives with their spouse Lives in: House/apartment Stairs: 1 stair Has following equipment at home: Environmental consultant - 2 wheeled   OCCUPATION: Retired   PLOF: Independent   PATIENT GOALS: Return to gardening, visiting grand kids     OBJECTIVE:    DIAGNOSTIC FINDINGS:  10/31/2021 review of FINDINGS: Changes of left knee replacement. No hardware or bony complicating feature. Soft tissue and joint space gas noted.   PATIENT SURVEYS:  10/31/2021 FOTO 38 (Goal 60 in 15 visits)   COGNITION: 10/31/2021 Overall cognitive status: Within functional limits for tasks assessed                         SENSATION: 10/31/2021 No complaints of peripheral pain or paresthesias   EDEMA:  10/31/2021 Noted but not objectively assessed     LOWER EXTREMITY ROM:   Active ROM  Right 10/31/2021 Left 10/31/2021 Left 11/01/2021 Left 11/?/2023  Hip flexion        Hip extension        Hip abduction        Hip adduction        Hip internal rotation        Hip external rotation        Knee flexion 112 40 50   Knee extension 0 -3 -3   Ankle dorsiflexion        Ankle plantarflexion        Ankle inversion        Ankle eversion         (Blank rows = not tested)   LOWER EXTREMITY MMT:   MMT Right 10/31/2021  Left 10/31/2021  Hip flexion      Hip extension      Hip abduction      Hip adduction      Hip internal rotation      Hip external rotation      Knee flexion      Knee extension 4/5 2/5  Ankle dorsiflexion      Ankle plantarflexion      Ankle inversion      Ankle eversion       (Blank rows = not tested)   GAIT: 10/31/2021 Distance walked: 50 feet Assistive device utilized: Environmental consultant - 2 wheeled Level of assistance:  Needs walker to reduce L knee WB Comments: Discussed the need for continued AD use to manage edema    TODAY'S TREATMENT                                                                          DATE:  11/08/2021 Recumbent bike Seat 6 AAROM only (unable to complete a revolution) for 8 minutes Quadriceps sets 10 reps for 5 seconds (toes back, press down and tighten thighs) Tailgate knee flexion 2 minutes Seated knee flexion AAROM 2 sets of 10 reps for 10 seconds (R pushes L into flexion) Knee extension machine stretch with PT-the weight of the shin bar adds overpressure for stretch into flexion 2 reps for 10 seconds (pain limited)   Functional Activities for stairs and sit to stand: Double leg press 62# 15X slow eccentrics Single (Left) leg press 31# 10X slow eccentrics  Vaso L knee Medium Pressure 34* for 10 minutes post-exercises   11/06/21 Manual  Retrograde massage with LEs elevated for edema control  Tennis ball STM with LEs elevated, great release of mm spasms/improvement in pain Brief patella mobs grade III all directions, mobility not too bad  TherEx  Supine quad sets x20 mod cues for quad activation instead of glutes Supine knee flexion AAROM- attempted but unable to tolerate in supine Seated self knee flexion AAROM x10    11/04/2021 Therex:  Nustep Lvl 5 8 mins UE/LE for range of motion (increased stiffness required nustep vs. Bike)  Seated Lt knee LAQ 2 x 10 c contralateral leg movement opposite and end range pauses each direction (cues for use  at home routinely during day)  Seated quad set 5 sec hold x 10 on Lt leg  Leg press Lt knee 25 lbs x 15 in available range  Anterior/posterior weight  shift in standing c mild HHA on walker x 15    Education given on sitting c knee flexed to promote motion gains vs. Keeping it in comfortable knee extension around 15/20 degrees.  Also educated on ankle pumps in elevation for swelling response.    Manual:  Lt knee flexion c distraction/IR mobilization c movement.  Contract/relax to Lt knee for flexion improvements.   Vaso  Lt knee Medium Pressure 34* for 10 minutes post-exercises   PATIENT EDUCATION:  11/04/2021 Education details: HEP update Person educated: Patient Education method: Explanation, Demonstration, Tactile cues, Verbal cues, and Handouts Education comprehension: verbalized understanding, returned demonstration, verbal cues required, tactile cues required   HOME EXERCISE PROGRAM: Access Code: [email or web address] URL: [email or web address] Date: 11/04/2021 Prepared by: Scot Jun  Exercises - Supine Quadricep Sets  - 5 x daily - 7 x weekly - 2 sets - 10 reps - 5 second hold - Seated Knee Flexion AAROM  - 5 x daily - 7 x weekly - 1 sets - 1 reps - 3 minutes hold - Seated Long Arc Quad (Mirrored)  - 3-5 x daily - 7 x weekly - 1-2 sets - 10 reps - 2 hold - Ankle Pumps in Elevation  - 2-3 x daily - 7 x weekly - 1-2 sets - 10 reps   ASSESSMENT:   CLINICAL IMPRESSION:  Bridget [name] is doing her home exercises, but not as much as recommended.  Extension AROM is great while flexion AROM and quadriceps strength are quite limited.  Edema is also very noticeable and Bridget [name] notes her cardiologist recommended a diuretic and she says she will call her cardiologist to start this to help with edema management.  We also discussed getting the compression stocking on in the morning and wearing it until bedtime.  Flexion AROM and edema control remain the early priorities with Bridget [name]'s  PT.    OBJECTIVE IMPAIRMENTS: Abnormal gait, decreased activity tolerance, decreased balance, decreased coordination, decreased endurance, decreased knowledge of condition, difficulty walking, decreased ROM, decreased strength, decreased safety awareness, increased edema, impaired perceived functional ability, obesity, and pain.    ACTIVITY LIMITATIONS: carrying, bending, sitting, standing, squatting, stairs, bed mobility, dressing, and locomotion level   PARTICIPATION LIMITATIONS: cleaning, driving, shopping, community activity, and yard work   PERSONAL FACTORS: OA, skin cancer, HTN, previous Rt knee scope, atrial fibrillation are also affecting patient's functional outcome.    REHAB POTENTIAL: Good   CLINICAL DECISION MAKING: Stable/uncomplicated   EVALUATION COMPLEXITY: Low     GOALS: Goals reviewed with patient? Yes   SHORT TERM GOALS: Target date: 11/28/2021  Bridget [name] will improve Lt knee flexion AROM to 90 degrees Baseline: 40 degrees Goal status: On Going 11/08/2021   2.  Bridget [name] will be independent and compliant 5/7 days with her starter HEP for 2 weeks Baseline:  Goal status: On Going 11/08/2021     LONG TERM GOALS: Target date: 01/09/2022    Improve FOTO to 60 Baseline: 38 Goal status: INITIAL   2.  Bridget [name] will report Lt knee pain consistently 3/10 or better on the VAS Baseline: Can be 7/10 Goal status: On Going 11/08/2021   3.  Improve Lt knee AROM to 2-0-105 or better Baseline: 3-0-40 Goal status: On Going 11/08/2021   4.  Improve Lt knee strength as assessed by MMT, hand-held dynamometer and transition to a cane or no AD Baseline: Walker and poor (2/5 MMT) strength Goal status: INITIAL   5.  Bridget [name] will be compliant and independent with her long-term HEP at DC  Baseline: Started 10/31/2021 Goal status: On Going 11/08/2021     PLAN:   PT FREQUENCY:  2-3x/week   PT DURATION: 10 weeks   PLANNED INTERVENTIONS: Therapeutic exercises, Therapeutic activity,  Neuromuscular re-education, Balance training, Gait training, Patient/Family education, Self Care, Joint mobilization, Stair training, Electrical stimulation, Cryotherapy, Vasopneumatic device, and Manual therapy   PLAN FOR NEXT SESSION: Edema control, quadriceps strength and FLEXION AROM.   Farley Ly PT, MPT 11/08/2021, 1:01 PM

## 2021-11-08 NOTE — Telephone Encounter (Signed)
Pt informed of MD recommendation. She will call and speak with ortho about this. She will call us back if determined no DVT/post op issues and we need to readdress diuretic. Patient verbalized understanding and agreeable to plan.

## 2021-11-11 ENCOUNTER — Telehealth: Payer: Self-pay | Admitting: *Deleted

## 2021-11-11 ENCOUNTER — Telehealth: Payer: Self-pay | Admitting: Orthopaedic Surgery

## 2021-11-11 ENCOUNTER — Ambulatory Visit (HOSPITAL_COMMUNITY)
Admission: RE | Admit: 2021-11-11 | Discharge: 2021-11-11 | Disposition: A | Discharge status: Home / Self Care | Payer: Medicare Other | Source: Ambulatory Visit | Attending: Orthopaedic Surgery | Admitting: Orthopaedic Surgery

## 2021-11-11 ENCOUNTER — Other Ambulatory Visit: Payer: Self-pay | Admitting: *Deleted

## 2021-11-11 DIAGNOSIS — Z96652 Presence of left artificial knee joint: Secondary | ICD-10-CM

## 2021-11-11 NOTE — Telephone Encounter (Signed)
Patient called this morning and states her flexion really is not improving. She is really restricted by swelling and pitting edema. She has spoken with Dr. Theodosia Blender office (Cardiology) about a diuretic, but per the notes, it looks like they are concerned about a possible DVT in the surgical leg or just wanting to rule it out before proceeding with the diuretic. She is a week shy of a month.No real pain in the calf, no chest pain/sob either. Flexion limited to around 50-60 degrees. Would you want to see her earlier or a doppler? Cardiology just wanted her to check with [place] first. Thank you.

## 2021-11-11 NOTE — Progress Notes (Signed)
VASCULAR LAB    Left lower extremity venous duplex has been performed.  See CV proc for preliminary results.  Called report to [facility], [facility], RVT 11/11/2021, 3:55 PM

## 2021-11-13 ENCOUNTER — Ambulatory Visit (INDEPENDENT_AMBULATORY_CARE_PROVIDER_SITE_OTHER): Payer: Medicare Other | Admitting: Rehabilitative and Restorative Service Providers"

## 2021-11-13 ENCOUNTER — Encounter: Payer: Self-pay | Admitting: Rehabilitative and Restorative Service Providers"

## 2021-11-13 DIAGNOSIS — M6281 Muscle weakness (generalized): Secondary | ICD-10-CM

## 2021-11-13 DIAGNOSIS — M25562 Pain in left knee: Secondary | ICD-10-CM

## 2021-11-13 DIAGNOSIS — M25662 Stiffness of left knee, not elsewhere classified: Secondary | ICD-10-CM

## 2021-11-13 DIAGNOSIS — R600 Localized edema: Secondary | ICD-10-CM | POA: Diagnosis not present

## 2021-11-13 DIAGNOSIS — R262 Difficulty in walking, not elsewhere classified: Secondary | ICD-10-CM | POA: Diagnosis not present

## 2021-11-13 NOTE — Therapy (Signed)
OUTPATIENT PHYSICAL THERAPY TREATMENT NOTE   Patient Name: Bridget [name] MRN: [id number] DOB:10/06/1945, 76 y.o., female Today's Date: 11/13/2021  END OF SESSION:   PT End of Session - 11/13/21 1354     Visit Number 6    Number of Visits 15    Progress Note Due on Visit 10    PT Start Time 3614    PT Stop Time 1433    PT Time Calculation (min) 48 min    Activity Tolerance Patient limited by pain;Patient tolerated treatment well    Behavior During Therapy [facility] for tasks assessed/performed             Past Medical History:  Diagnosis Date   Arthritis    Cancer ([place])    basal cell cancer removed on face   Hypertension    Seasonal allergies    Sleep apnea    Past Surgical History:  Procedure Laterality Date   BREAST EXCISIONAL BIOPSY Left    CHOLECYSTECTOMY  02/2010   KNEE SURGERY Right    Meniscal tear   OOPHORECTOMY  01/2003   RSO AT [facility]   TOTAL KNEE ARTHROPLASTY Left 10/18/2021   Procedure: LEFT TOTAL KNEE ARTHROPLASTY;  Surgeon: Mcarthur Rossetti, MD;  Location: [facility];  Service: Orthopedics;  Laterality: Left;   VAGINAL HYSTERECTOMY  01/2003   TVH,RSO A&P REPAIR   WISDOM TOOTH EXTRACTION     Patient Active Problem List   Diagnosis Date Noted   Status post total left knee replacement 10/18/2021   Unilateral primary osteoarthritis, left knee 07/31/2021   Degenerative arthritis of left knee 11/24/2019   Secondary hypercoagulable state ([place]) 01/26/2019   Greater trochanteric bursitis of right hip 09/23/2018   Lumbar radiculopathy 08/12/2018   Gluteal tendonitis of right buttock 07/29/2018   Atrial fibrillation ([place]) 07/21/2018   Arthritis of left acromioclavicular joint 04/26/2018   Partial nontraumatic tear of left rotator cuff 04/26/2018   Hypertension    Seasonal allergies      THERAPY DIAG:  Difficulty in walking, not elsewhere classified  Localized edema  Muscle weakness (generalized)  Stiffness of left knee, not elsewhere  classified  Left knee pain, unspecified chronicity  PCP: Jonathon Jordan, MD   REFERRING PROVIDER: Mcarthur Rossetti, MD   REFERRING DIAG: [phone] (ICD-10-CM) - Unilateral primary osteoarthritis, left knee    THERAPY DIAG:  Difficulty in walking, not elsewhere classified   Localized edema   Muscle weakness (generalized)   Stiffness of left knee, not elsewhere classified   Left knee pain, unspecified chronicity   Rationale for Evaluation and Treatment Rehabilitation   ONSET DATE: Surgery 10/18/2021   SUBJECTIVE:    SUBJECTIVE STATEMENT: Zana reports she is sleeping well.  She reports much better HEP compliance since her last PT visit.  Flexion AROM and edema/fluid are quite limiting.  PERTINENT HISTORY: OA, skin cancer, HTN, previous R knee scope, atrial fibrillation PAIN:  NPRS scale: 3-5/10 this week Pain location: Lt knee Pain description: tightness, throbbing, aching  Aggravating factors: standing, colder weather  Relieving factors: ice, elevation    PRECAUTIONS: None   WEIGHT BEARING RESTRICTIONS: No   FALLS:  Has patient fallen in last 6 months? No   LIVING ENVIRONMENT: Lives with: lives with their spouse Lives in: House/apartment Stairs: 1 stair Has following equipment at home: Environmental consultant - 2 wheeled   OCCUPATION: Retired   PLOF: Independent   PATIENT GOALS: Return to gardening, visiting grand kids     OBJECTIVE:    DIAGNOSTIC FINDINGS:  10/31/2021 review of FINDINGS: Changes of left knee replacement. No hardware or bony complicating feature. Soft tissue and joint space gas noted.   PATIENT SURVEYS:  10/31/2021 FOTO 38 (Goal 60 in 15 visits)   COGNITION: 10/31/2021 Overall cognitive status: Within functional limits for tasks assessed                         SENSATION: 10/31/2021 No complaints of peripheral pain or paresthesias   EDEMA:  10/31/2021 Noted but not objectively assessed     LOWER EXTREMITY ROM:   Active ROM  Right 10/31/2021 Left 10/31/2021 Left 11/01/2021 Left 11/13/2021  Hip flexion        Hip extension        Hip abduction        Hip adduction        Hip internal rotation        Hip external rotation        Knee flexion 112 40 50 58  Knee extension 0 -3 -3 -3  Ankle dorsiflexion        Ankle plantarflexion        Ankle inversion        Ankle eversion         (Blank rows = not tested)   LOWER EXTREMITY MMT:   MMT Right 10/31/2021 Left 10/31/2021  Hip flexion      Hip extension      Hip abduction      Hip adduction      Hip internal rotation      Hip external rotation      Knee flexion      Knee extension 4/5 2/5  Ankle dorsiflexion      Ankle plantarflexion      Ankle inversion      Ankle eversion       (Blank rows = not tested)   GAIT: 10/31/2021 Distance walked: 50 feet Assistive device utilized: Environmental consultant - 2 wheeled Level of assistance:  Needs walker to reduce L knee WB Comments: Discussed the need for continued AD use to manage edema    TODAY'S TREATMENT                                                                          DATE:  11/13/2021 Recumbent bike Seat 6 AAROM only (unable to complete a revolution) for 8 minutes Quadriceps sets 10 reps for 5 seconds (toes back, press down and tighten thighs) Tailgate knee flexion 2 minutes Seated knee flexion AAROM 2 sets of 10 reps for 10 seconds (R pushes L into flexion)   Functional Activities for stairs and sit to stand: Double leg press 62# 15X slow eccentrics Single (Left) leg press 31# 10X slow eccentrics   11/08/2021 Recumbent bike Seat 6 AAROM only (unable to complete a revolution) for 8 minutes Quadriceps sets 10 reps for 5 seconds (toes back, press down and tighten thighs) Tailgate knee flexion 2 minutes Seated knee flexion AAROM 2 sets of 10 reps for 10 seconds (R pushes L into flexion) Knee extension machine stretch with PT-the weight of the shin bar adds overpressure for stretch into flexion 2 reps  for 10 seconds (pain limited)  Functional Activities for stairs and sit to stand: Double leg press 62# 15X slow eccentrics Single (Left) leg press 31# 10X slow eccentrics  Vaso L knee Medium Pressure 34* for 10 minutes post-exercises   11/06/21 Manual  Retrograde massage with LEs elevated for edema control  Tennis ball STM with LEs elevated, great release of mm spasms/improvement in pain Brief patella mobs grade III all directions, mobility not too bad  TherEx  Supine quad sets x20 mod cues for quad activation instead of glutes Supine knee flexion AAROM- attempted but unable to tolerate in supine Seated self knee flexion AAROM x10    PATIENT EDUCATION:  11/04/2021 Education details: HEP update Person educated: Patient Education method: Explanation, Demonstration, Tactile cues, Verbal cues, and Handouts Education comprehension: verbalized understanding, returned demonstration, verbal cues required, tactile cues required   HOME EXERCISE PROGRAM: Access Code: [email or web address] URL: [email or web address] Date: 11/04/2021 Prepared by: Scot Jun  Exercises - Supine Quadricep Sets  - 5 x daily - 7 x weekly - 2 sets - 10 reps - 5 second hold - Seated Knee Flexion AAROM  - 5 x daily - 7 x weekly - 1 sets - 1 reps - 3 minutes hold - Seated Long Arc Quad (Mirrored)  - 3-5 x daily - 7 x weekly - 1-2 sets - 10 reps - 2 hold - Ankle Pumps in Elevation  - 2-3 x daily - 7 x weekly - 1-2 sets - 10 reps   ASSESSMENT:   CLINICAL IMPRESSION:  Malachy Moan has been great with her home exercise compliance since her last PT visit on Friday, but flexion AROM is still limited by edema/fluid.  Extension AROM is great while flexion AROM and and edema/fluid are quite limiting.   Flexion AROM and edema control remain the priorities with Avleen's PT.    OBJECTIVE IMPAIRMENTS: Abnormal gait, decreased activity tolerance, decreased balance, decreased coordination, decreased endurance, decreased  knowledge of condition, difficulty walking, decreased ROM, decreased strength, decreased safety awareness, increased edema, impaired perceived functional ability, obesity, and pain.    ACTIVITY LIMITATIONS: carrying, bending, sitting, standing, squatting, stairs, bed mobility, dressing, and locomotion level   PARTICIPATION LIMITATIONS: cleaning, driving, shopping, community activity, and yard work   PERSONAL FACTORS: OA, skin cancer, HTN, previous Rt knee scope, atrial fibrillation are also affecting patient's functional outcome.    REHAB POTENTIAL: Good   CLINICAL DECISION MAKING: Stable/uncomplicated   EVALUATION COMPLEXITY: Low     GOALS: Goals reviewed with patient? Yes   SHORT TERM GOALS: Target date: 11/28/2021  Lisset will improve Lt knee flexion AROM to 90 degrees Baseline: 40 degrees Goal status: On Going 11/13/2021   2.  Monik will be independent and compliant 5/7 days with her starter HEP for 2 weeks Baseline:  Goal status: Met 11/13/2021     LONG TERM GOALS: Target date: 01/09/2022    Improve FOTO to 60 Baseline: 38 Goal status: INITIAL   2.  Anntoinette will report Lt knee pain consistently 3/10 or better on the VAS Baseline: Can be 7/10 Goal status: On Going 11/13/2021   3.  Improve Lt knee AROM to 2-0-105 or better Baseline: 3-0-40 Goal status: On Going 11/13/2021   4.  Improve Lt knee strength as assessed by MMT, hand-held dynamometer and transition to a cane or no AD Baseline: Walker and poor (2/5 MMT) strength Goal status: INITIAL   5.  Akiyah will be compliant and independent with her long-term HEP at DC Baseline: Started 10/31/2021 Goal status: On Going 11/13/2021  PLAN:   PT FREQUENCY:  2-3x/week   PT DURATION: 10 weeks   PLANNED INTERVENTIONS: Therapeutic exercises, Therapeutic activity, Neuromuscular re-education, Balance training, Gait training, Patient/Family education, Self Care, Joint mobilization, Stair training, Electrical stimulation,  Cryotherapy, Vasopneumatic device, and Manual therapy   PLAN FOR NEXT SESSION: Edema control, quadriceps strength and FLEXION AROM.   Farley Ly PT, MPT 11/13/2021, 5:35 PM

## 2021-11-14 ENCOUNTER — Telehealth: Payer: Self-pay | Admitting: Cardiology

## 2021-11-14 DIAGNOSIS — R609 Edema, unspecified: Secondary | ICD-10-CM

## 2021-11-14 DIAGNOSIS — R600 Localized edema: Secondary | ICD-10-CM

## 2021-11-14 MED ORDER — FUROSEMIDE 20 MG PO TABS: 20.0000 mg | ORAL_TABLET | Freq: Every day | ORAL | 3 refills | Status: DC

## 2021-11-14 MED ORDER — BENAZEPRIL HCL 40 MG PO TABS: 40.0000 mg | ORAL_TABLET | Freq: Every day | ORAL | 3 refills | Status: DC

## 2021-11-14 NOTE — Telephone Encounter (Signed)
Advised patient that Dr. Radford Pax does have access to the doppler study Dr. Rush Farmer performed in his office.

## 2021-11-14 NOTE — Telephone Encounter (Signed)
Spoke with patient to advise of Dr. Landis Gandy new orders. Advised to stop benazepril '20mg'$  and benazepril HCT, start lasix  '40mg'$  daily for 2 days then '20mg'$  daily. Patient states she has not been taking the benazepril and that was supposed to have been removed from her list. Medication list has been updated.  Patient agreed to above plan. She will come in on 11/21/21 for a BMET.   Provided education on daily weights and keeping a record. If she gains 2lbs in 24 hours or 5lbs in a week she will notify us.

## 2021-11-14 NOTE — Telephone Encounter (Signed)
New Message:    Patient said she had a doppler at Dr Rush Farmer on 11-11-21. He was sup;posed to have sent the results to Dr Radford Pax. She wants to know if Dr Radford Pax have received them>

## 2021-11-14 NOTE — Addendum Note (Signed)
Addended by: Vergia Alcon A on: 11/14/2021 03:47 PM   Modules accepted: Orders

## 2021-11-15 ENCOUNTER — Encounter: Payer: Medicare Other | Admitting: Rehabilitative and Restorative Service Providers"

## 2021-11-15 ENCOUNTER — Ambulatory Visit (INDEPENDENT_AMBULATORY_CARE_PROVIDER_SITE_OTHER): Payer: Medicare Other | Admitting: Rehabilitative and Restorative Service Providers"

## 2021-11-15 ENCOUNTER — Encounter: Payer: Self-pay | Admitting: Rehabilitative and Restorative Service Providers"

## 2021-11-15 DIAGNOSIS — M25562 Pain in left knee: Secondary | ICD-10-CM

## 2021-11-15 DIAGNOSIS — R262 Difficulty in walking, not elsewhere classified: Secondary | ICD-10-CM | POA: Diagnosis not present

## 2021-11-15 DIAGNOSIS — M6281 Muscle weakness (generalized): Secondary | ICD-10-CM

## 2021-11-15 DIAGNOSIS — M25662 Stiffness of left knee, not elsewhere classified: Secondary | ICD-10-CM | POA: Diagnosis not present

## 2021-11-15 DIAGNOSIS — R600 Localized edema: Secondary | ICD-10-CM

## 2021-11-15 NOTE — Therapy (Signed)
OUTPATIENT PHYSICAL THERAPY TREATMENT NOTE   Patient Name: Bridget [name] MRN: [id number] DOB:[birth year], 76 y.o., female Today's Date: 11/15/2021  END OF SESSION:   PT End of Session - 11/15/21 1333     Visit Number 7    Number of Visits 15    Progress Note Due on Visit 10    PT Start Time 1332    PT Stop Time 1423    PT Time Calculation (min) 51 min    Activity Tolerance Patient limited by pain;Patient tolerated treatment well    Behavior During Therapy [facility] for tasks assessed/performed              Past Medical History:  Diagnosis Date   Arthritis    Cancer ([place])    basal cell cancer removed on face   Hypertension    Seasonal allergies    Sleep apnea    Past Surgical History:  Procedure Laterality Date   BREAST EXCISIONAL BIOPSY Left    CHOLECYSTECTOMY  02/2010   KNEE SURGERY Right    Meniscal tear   OOPHORECTOMY  01/2003   RSO AT [facility]   TOTAL KNEE ARTHROPLASTY Left 10/18/2021   Procedure: LEFT TOTAL KNEE ARTHROPLASTY;  Surgeon: Mcarthur Rossetti, MD;  Location: [facility];  Service: Orthopedics;  Laterality: Left;   VAGINAL HYSTERECTOMY  01/2003   TVH,RSO A&P REPAIR   WISDOM TOOTH EXTRACTION     Patient Active Problem List   Diagnosis Date Noted   Status post total left knee replacement 10/18/2021   Unilateral primary osteoarthritis, left knee 07/31/2021   Degenerative arthritis of left knee 11/24/2019   Secondary hypercoagulable state ([place]) 01/26/2019   Greater trochanteric bursitis of right hip 09/23/2018   Lumbar radiculopathy 08/12/2018   Gluteal tendonitis of right buttock 07/29/2018   Atrial fibrillation ([place]) 07/21/2018   Arthritis of left acromioclavicular joint 04/26/2018   Partial nontraumatic tear of left rotator cuff 04/26/2018   Hypertension    Seasonal allergies      THERAPY DIAG:  Difficulty in walking, not elsewhere classified  Localized edema  Muscle weakness (generalized)  Stiffness of left knee, not elsewhere  classified  Left knee pain, unspecified chronicity  PCP: Jonathon Jordan, MD   REFERRING PROVIDER: Mcarthur Rossetti, MD   REFERRING DIAG: [phone] (ICD-10-CM) - Unilateral primary osteoarthritis, left knee    THERAPY DIAG:  Difficulty in walking, not elsewhere classified   Localized edema   Muscle weakness (generalized)   Stiffness of left knee, not elsewhere classified   Left knee pain, unspecified chronicity   Rationale for Evaluation and Treatment Rehabilitation   ONSET DATE: Surgery 10/18/2021   SUBJECTIVE:    SUBJECTIVE STATEMENT: Sharetta reports she has started taking a diuretic (today).  Edema/fluid is significant in her left leg.  PERTINENT HISTORY: OA, skin cancer, HTN, previous R knee scope, atrial fibrillation PAIN:  NPRS scale: 3-7/10 this week Pain location: Lt knee Pain description: tightness, throbbing, aching  Aggravating factors: standing, colder weather  Relieving factors: ice, elevation    PRECAUTIONS: None   WEIGHT BEARING RESTRICTIONS: No   FALLS:  Has patient fallen in last 6 months? No   LIVING ENVIRONMENT: Lives with: lives with their spouse Lives in: House/apartment Stairs: 1 stair Has following equipment at home: Environmental consultant - 2 wheeled   OCCUPATION: Retired   PLOF: Independent   PATIENT GOALS: Return to gardening, visiting grand kids     OBJECTIVE:    DIAGNOSTIC FINDINGS:  10/31/2021 review of FINDINGS: Changes of left knee  replacement. No hardware or bony complicating feature. Soft tissue and joint space gas noted.   PATIENT SURVEYS:  10/31/2021 FOTO 38 (Goal 60 in 15 visits)   COGNITION: 10/31/2021 Overall cognitive status: Within functional limits for tasks assessed                         SENSATION: 10/31/2021 No complaints of peripheral pain or paresthesias   EDEMA:  10/31/2021 Noted but not objectively assessed     LOWER EXTREMITY ROM:   Active ROM Right 10/31/2021 Left 10/31/2021 Left 11/01/2021 Left  11/13/2021  Hip flexion        Hip extension        Hip abduction        Hip adduction        Hip internal rotation        Hip external rotation        Knee flexion 112 40 50 58  Knee extension 0 -3 -3 -3  Ankle dorsiflexion        Ankle plantarflexion        Ankle inversion        Ankle eversion         (Blank rows = not tested)   LOWER EXTREMITY MMT:   MMT Right 10/31/2021 Left 10/31/2021  Hip flexion      Hip extension      Hip abduction      Hip adduction      Hip internal rotation      Hip external rotation      Knee flexion      Knee extension 4/5 2/5  Ankle dorsiflexion      Ankle plantarflexion      Ankle inversion      Ankle eversion       (Blank rows = not tested)   GAIT: 10/31/2021 Distance walked: 50 feet Assistive device utilized: Environmental consultant - 2 wheeled Level of assistance:  Needs walker to reduce L knee WB Comments: Discussed the need for continued AD use to manage edema    TODAY'S TREATMENT                                                                          DATE:  11/15/2021 Recumbent bike Seat 6 AAROM only (unable to complete a revolution) for 8 minutes Quadriceps sets 10 reps for 5 seconds (toes back, press down and tighten thighs) Tailgate knee flexion 2 minutes   Functional Activities for stairs and sit to stand (in a very limited range today due to edema): Double leg press 62# 15X slow eccentrics Single (Left) leg press 25# 20X slow eccentrics   Vasopneumatic Left knee 34* Medium Pressure 10 minutes   11/13/2021 Recumbent bike Seat 6 AAROM only (unable to complete a revolution) for 8 minutes Quadriceps sets 10 reps for 5 seconds (toes back, press down and tighten thighs) - Had to do seated today due to discomfort while supine Tailgate knee flexion 2 minutes Seated knee flexion AAROM 2 sets of 10 reps for 10 seconds (R pushes L into flexion)   Functional Activities for stairs and sit to stand: Double leg press 62# 15X slow  eccentrics Single (Left) leg press  31# 10X slow eccentrics   11/08/2021 Recumbent bike Seat 6 AAROM only (unable to complete a revolution) for 8 minutes Quadriceps sets 10 reps for 5 seconds (toes back, press down and tighten thighs) Tailgate knee flexion 2 minutes Seated knee flexion AAROM 2 sets of 10 reps for 10 seconds (R pushes L into flexion) Knee extension machine stretch with PT-the weight of the shin bar adds overpressure for stretch into flexion 2 reps for 10 seconds (pain limited)   Functional Activities for stairs and sit to stand: Double leg press 62# 15X slow eccentrics Single (Left) leg press 31# 10X slow eccentrics  Vaso L knee Medium Pressure 34* for 10 minutes post-exercises   PATIENT EDUCATION:  11/04/2021 Education details: HEP update Person educated: Patient Education method: Explanation, Demonstration, Tactile cues, Verbal cues, and Handouts Education comprehension: verbalized understanding, returned demonstration, verbal cues required, tactile cues required   HOME EXERCISE PROGRAM: Access Code: [email or web address] URL: [email or web address] Date: 11/04/2021 Prepared by: Scot Jun  Exercises - Supine Quadricep Sets  - 5 x daily - 7 x weekly - 2 sets - 10 reps - 5 second hold - Seated Knee Flexion AAROM  - 5 x daily - 7 x weekly - 1 sets - 1 reps - 3 minutes hold - Seated Long Arc Quad (Mirrored)  - 3-5 x daily - 7 x weekly - 1-2 sets - 10 reps - 2 hold - Ankle Pumps in Elevation  - 2-3 x daily - 7 x weekly - 1-2 sets - 10 reps   ASSESSMENT:   CLINICAL IMPRESSION:  Malachy Moan continues to be great with her home exercise compliance, but flexion AROM is still limited by edema/fluid.  Now that she is taking a diuretic, we are hoping for quicker progress.  Extension AROM remains great while flexion AROM and and edema/fluid are limiting.   Flexion AROM and edema control remain the priorities with Zaydah's PT.    OBJECTIVE IMPAIRMENTS: Abnormal gait,  decreased activity tolerance, decreased balance, decreased coordination, decreased endurance, decreased knowledge of condition, difficulty walking, decreased ROM, decreased strength, decreased safety awareness, increased edema, impaired perceived functional ability, obesity, and pain.    ACTIVITY LIMITATIONS: carrying, bending, sitting, standing, squatting, stairs, bed mobility, dressing, and locomotion level   PARTICIPATION LIMITATIONS: cleaning, driving, shopping, community activity, and yard work   PERSONAL FACTORS: OA, skin cancer, HTN, previous Rt knee scope, atrial fibrillation are also affecting patient's functional outcome.    REHAB POTENTIAL: Good   CLINICAL DECISION MAKING: Stable/uncomplicated   EVALUATION COMPLEXITY: Low     GOALS: Goals reviewed with patient? Yes   SHORT TERM GOALS: Target date: 11/28/2021  Kazuko will improve Lt knee flexion AROM to 90 degrees Baseline: 40 degrees Goal status: On Going 11/13/2021   2.  Vala will be independent and compliant 5/7 days with her starter HEP for 2 weeks Baseline:  Goal status: Met 11/13/2021     LONG TERM GOALS: Target date: 01/09/2022    Improve FOTO to 60 Baseline: 38 Goal status: INITIAL   2.  Melda will report Lt knee pain consistently 3/10 or better on the VAS Baseline: Can be 7/10 Goal status: On Going 11/13/2021   3.  Improve Lt knee AROM to 2-0-105 or better Baseline: 3-0-40 Goal status: On Going 11/13/2021   4.  Improve Lt knee strength as assessed by MMT, hand-held dynamometer and transition to a cane or no AD Baseline: Walker and poor (2/5 MMT) strength Goal status: INITIAL   5.  Ethlyn will be  compliant and independent with her long-term HEP at DC Baseline: Started 10/31/2021 Goal status: On Going 11/13/2021     PLAN:   PT FREQUENCY:  2-3x/week   PT DURATION: 10 weeks   PLANNED INTERVENTIONS: Therapeutic exercises, Therapeutic activity, Neuromuscular re-education, Balance training, Gait  training, Patient/Family education, Self Care, Joint mobilization, Stair training, Electrical stimulation, Cryotherapy, Vasopneumatic device, and Manual therapy   PLAN FOR NEXT SESSION: Edema control, quadriceps strength and FLEXION AROM.   Farley Ly PT, MPT 11/15/2021, 2:17 PM

## 2021-11-20 ENCOUNTER — Ambulatory Visit (INDEPENDENT_AMBULATORY_CARE_PROVIDER_SITE_OTHER): Payer: Medicare Other | Admitting: Rehabilitative and Restorative Service Providers"

## 2021-11-20 ENCOUNTER — Encounter: Payer: Self-pay | Admitting: Rehabilitative and Restorative Service Providers"

## 2021-11-20 DIAGNOSIS — M25662 Stiffness of left knee, not elsewhere classified: Secondary | ICD-10-CM | POA: Diagnosis not present

## 2021-11-20 DIAGNOSIS — M25562 Pain in left knee: Secondary | ICD-10-CM

## 2021-11-20 DIAGNOSIS — M6281 Muscle weakness (generalized): Secondary | ICD-10-CM

## 2021-11-20 DIAGNOSIS — R262 Difficulty in walking, not elsewhere classified: Secondary | ICD-10-CM | POA: Diagnosis not present

## 2021-11-20 DIAGNOSIS — R600 Localized edema: Secondary | ICD-10-CM

## 2021-11-20 NOTE — Therapy (Signed)
OUTPATIENT PHYSICAL THERAPY TREATMENT/PROGRESS NOTE  Progress Note Reporting Period 10/31/2021 to 11/20/2021  See note below for Objective Data and Assessment of Progress/Goals.     Patient Name: Bridget [name] MRN: [id number] DOB:1945-02-11, 76 y.o., female Today's Date: 11/20/2021  END OF SESSION:   PT End of Session - 11/20/21 1344     Visit Number 8    Number of Visits 15    Progress Note Due on Visit 10    PT Start Time 4765    PT Stop Time 1439    PT Time Calculation (min) 56 min    Activity Tolerance Patient limited by pain;Patient tolerated treatment well    Behavior During Therapy [facility] for tasks assessed/performed               Past Medical History:  Diagnosis Date   Arthritis    Cancer ([place])    basal cell cancer removed on face   Hypertension    Seasonal allergies    Sleep apnea    Past Surgical History:  Procedure Laterality Date   BREAST EXCISIONAL BIOPSY Left    CHOLECYSTECTOMY  02/2010   KNEE SURGERY Right    Meniscal tear   OOPHORECTOMY  01/2003   RSO AT [facility]   TOTAL KNEE ARTHROPLASTY Left 10/18/2021   Procedure: LEFT TOTAL KNEE ARTHROPLASTY;  Surgeon: Mcarthur Rossetti, MD;  Location: [facility];  Service: Orthopedics;  Laterality: Left;   VAGINAL HYSTERECTOMY  01/2003   TVH,RSO A&P REPAIR   WISDOM TOOTH EXTRACTION     Patient Active Problem List   Diagnosis Date Noted   Status post total left knee replacement 10/18/2021   Unilateral primary osteoarthritis, left knee 07/31/2021   Degenerative arthritis of left knee 11/24/2019   Secondary hypercoagulable state ([place]) 01/26/2019   Greater trochanteric bursitis of right hip 09/23/2018   Lumbar radiculopathy 08/12/2018   Gluteal tendonitis of right buttock 07/29/2018   Atrial fibrillation ([place]) 07/21/2018   Arthritis of left acromioclavicular joint 04/26/2018   Partial nontraumatic tear of left rotator cuff 04/26/2018   Hypertension    Seasonal allergies      THERAPY DIAG:   Difficulty in walking, not elsewhere classified  Localized edema  Muscle weakness (generalized)  Stiffness of left knee, not elsewhere classified  Left knee pain, unspecified chronicity  PCP: Jonathon Jordan, MD   REFERRING PROVIDER: Mcarthur Rossetti, MD   REFERRING DIAG: [phone] (ICD-10-CM) - Unilateral primary osteoarthritis, left knee    THERAPY DIAG:  Difficulty in walking, not elsewhere classified   Localized edema   Muscle weakness (generalized)   Stiffness of left knee, not elsewhere classified   Left knee pain, unspecified chronicity   Rationale for Evaluation and Treatment Rehabilitation   ONSET DATE: Surgery 10/18/2021   SUBJECTIVE:    SUBJECTIVE STATEMENT: Adan reports she has been taking diuretics over the past 5 days.  Edema/fluid is slightly improved but is still significant in her left leg.  PERTINENT HISTORY: OA, skin cancer, HTN, previous R knee scope, atrial fibrillation PAIN:  NPRS scale: 3-7/10 this week Pain location: Lt knee Pain description: tightness, throbbing, aching  Aggravating factors: standing, colder weather  Relieving factors: ice, elevation    PRECAUTIONS: None   WEIGHT BEARING RESTRICTIONS: No   FALLS:  Has patient fallen in last 6 months? No   LIVING ENVIRONMENT: Lives with: lives with their spouse Lives in: House/apartment Stairs: 1 stair Has following equipment at home: Gilford Rile - 2 wheeled   OCCUPATION: Retired   PLOF:  Independent   PATIENT GOALS: Return to gardening, visiting grand kids     OBJECTIVE:    DIAGNOSTIC FINDINGS:  10/31/2021 review of FINDINGS: Changes of left knee replacement. No hardware or bony complicating feature. Soft tissue and joint space gas noted.   PATIENT SURVEYS:  11/20/2021 FOTO 31 (Goal 60)  10/31/2021 FOTO 38 (Goal 60 in 15 visits)   COGNITION: 10/31/2021 Overall cognitive status: Within functional limits for tasks assessed                          SENSATION: 10/31/2021 No complaints of peripheral pain or paresthesias   EDEMA:  10/31/2021 Noted but not objectively assessed     LOWER EXTREMITY ROM:   Active ROM Right 10/31/2021 Left 10/31/2021 Left 11/01/2021 Left 11/13/2021 Left 11/20/2021  Hip flexion         Hip extension         Hip abduction         Hip adduction         Hip internal rotation         Hip external rotation         Knee flexion 112 40 50 58 62  Knee extension 0 -3 -3 -3 -2  Ankle dorsiflexion         Ankle plantarflexion         Ankle inversion         Ankle eversion          (Blank rows = not tested)   LOWER EXTREMITY MMT:   MMT Right 10/31/2021 Left 10/31/2021  Hip flexion      Hip extension      Hip abduction      Hip adduction      Hip internal rotation      Hip external rotation      Knee flexion      Knee extension 4/5 2/5  Ankle dorsiflexion      Ankle plantarflexion      Ankle inversion      Ankle eversion       (Blank rows = not tested)   GAIT: 10/31/2021 Distance walked: 50 feet Assistive device utilized: Environmental consultant - 2 wheeled Level of assistance:  Needs walker to reduce L knee WB Comments: Discussed the need for continued AD use to manage edema    TODAY'S TREATMENT                                                                          DATE:  11/20/2021 Recumbent bike Seat 6 AAROM only (unable to complete a revolution) for 5 minutes Quadriceps 2 sets of 10 reps for 5 seconds (toes back, press down and tighten thighs) Tailgate Lt knee flexion 1 minute Contract-relax knee flexion stretch with PT (seated with towel roll under L distal thigh), push into extension for 5 seconds, bend back for 10 seconds, then repeat 10X   Functional Activities for stairs and sit to stand (in a very limited range today due to edema): Double leg press 68# 15X slow eccentrics Single (Left) leg press 25# 20X slow eccentrics   Vasopneumatic Left knee 34* Medium Pressure 10  minutes   11/15/2021 Recumbent  bike Seat 6 AAROM only (unable to complete a revolution) for 8 minutes Quadriceps sets 10 reps for 5 seconds (toes back, press down and tighten thighs) Tailgate knee flexion 2 minutes   Functional Activities for stairs and sit to stand (in a very limited range today due to edema): Double leg press 62# 15X slow eccentrics Single (Left) leg press 25# 20X slow eccentrics   Vasopneumatic Left knee 34* Medium Pressure 10 minutes   11/13/2021 Recumbent bike Seat 6 AAROM only (unable to complete a revolution) for 8 minutes Quadriceps sets 10 reps for 5 seconds (toes back, press down and tighten thighs) - Had to do seated today due to discomfort while supine Tailgate knee flexion 2 minutes Seated knee flexion AAROM 2 sets of 10 reps for 10 seconds (R pushes L into flexion)   Functional Activities for stairs and sit to stand: Double leg press 62# 15X slow eccentrics Single (Left) leg press 31# 10X slow eccentrics    PATIENT EDUCATION:  11/04/2021 Education details: HEP update Person educated: Patient Education method: Explanation, Demonstration, Tactile cues, Verbal cues, and Handouts Education comprehension: verbalized understanding, returned demonstration, verbal cues required, tactile cues required   HOME EXERCISE PROGRAM: Access Code: [email or web address] URL: [email or web address] Date: 11/04/2021 Prepared by: Scot Jun  Exercises - Supine Quadricep Sets  - 5 x daily - 7 x weekly - 2 sets - 10 reps - 5 second hold - Seated Knee Flexion AAROM  - 5 x daily - 7 x weekly - 1 sets - 1 reps - 3 minutes hold - Seated Long Arc Quad (Mirrored)  - 3-5 x daily - 7 x weekly - 1-2 sets - 10 reps - 2 hold - Ankle Pumps in Elevation  - 2-3 x daily - 7 x weekly - 1-2 sets - 10 reps   ASSESSMENT:   CLINICAL IMPRESSION:  Malachy Moan continues to be compliant with her home exercise compliance, but flexion AROM is still limited by edema/fluid.  She is taking a  diuretic, and edema does appear to be a bit better, although it has only been 5-6 days and edema/fluid is still obvious on the left side.  Extension AROM is great while flexion AROM and and edema/fluid are limiting.   Flexion AROM and edema control remain the priorities with Clorissa's PT.  We did discuss maybe doing quadriceps sets in the adjustable bed with her head a bit lower than her feet to help with gravity assisted fluid/edema removal and objective flexion measurements were better today vs last week.  Continue current emphasis on edema/fluid reduction, quadriceps strength and flexion AROM.    OBJECTIVE IMPAIRMENTS: Abnormal gait, decreased activity tolerance, decreased balance, decreased coordination, decreased endurance, decreased knowledge of condition, difficulty walking, decreased ROM, decreased strength, decreased safety awareness, increased edema, impaired perceived functional ability, obesity, and pain.    ACTIVITY LIMITATIONS: carrying, bending, sitting, standing, squatting, stairs, bed mobility, dressing, and locomotion level   PARTICIPATION LIMITATIONS: cleaning, driving, shopping, community activity, and yard work   PERSONAL FACTORS: OA, skin cancer, HTN, previous Rt knee scope, atrial fibrillation are also affecting patient's functional outcome.    REHAB POTENTIAL: Good   CLINICAL DECISION MAKING: Stable/uncomplicated   EVALUATION COMPLEXITY: Low     GOALS: Goals reviewed with patient? Yes   SHORT TERM GOALS: Target date: 11/28/2021  Arden will improve Lt knee flexion AROM to 90 degrees Baseline: 40 degrees Goal status: On Going 11/20/2021   2.  Meighan will be independent and compliant 5/7 days with her starter  HEP for 2 weeks Baseline:  Goal status: Met 11/13/2021     LONG TERM GOALS: Target date: 01/09/2022    Improve FOTO to 60 Baseline: 38 Goal status: INITIAL   2.  Tenlee will report Lt knee pain consistently 3/10 or better on the VAS Baseline: Can be  7/10 Goal status: On Going 11/20/2021   3.  Improve Lt knee AROM to 2-0-105 or better Baseline: 3-0-40 Goal status: Partially Met 11/20/2021   4.  Improve Lt knee strength as assessed by MMT, hand-held dynamometer and transition to a cane or no AD Baseline: Walker and poor (2/5 MMT) strength Goal status: On Going 11/20/2021   5.  Shantele will be compliant and independent with her long-term HEP at DC Baseline: Started 10/31/2021 Goal status: On Going 11/20/2021     PLAN:   PT FREQUENCY:  2-3x/week   PT DURATION: 10 weeks   PLANNED INTERVENTIONS: Therapeutic exercises, Therapeutic activity, Neuromuscular re-education, Balance training, Gait training, Patient/Family education, Self Care, Joint mobilization, Stair training, Electrical stimulation, Cryotherapy, Vasopneumatic device, and Manual therapy   PLAN FOR NEXT SESSION: Edema control, quadriceps strength and FLEXION AROM.  Progress note sent last visit.   Farley Ly PT, MPT 11/20/2021, 2:47 PM

## 2021-11-21 ENCOUNTER — Ambulatory Visit: Payer: Medicare Other | Attending: Cardiology

## 2021-11-21 DIAGNOSIS — R600 Localized edema: Secondary | ICD-10-CM | POA: Diagnosis not present

## 2021-11-21 DIAGNOSIS — R609 Edema, unspecified: Secondary | ICD-10-CM | POA: Diagnosis not present

## 2021-11-22 ENCOUNTER — Ambulatory Visit (INDEPENDENT_AMBULATORY_CARE_PROVIDER_SITE_OTHER): Payer: Medicare Other | Admitting: Rehabilitative and Restorative Service Providers"

## 2021-11-22 ENCOUNTER — Encounter: Payer: Self-pay | Admitting: Rehabilitative and Restorative Service Providers"

## 2021-11-22 DIAGNOSIS — R262 Difficulty in walking, not elsewhere classified: Secondary | ICD-10-CM

## 2021-11-22 DIAGNOSIS — R600 Localized edema: Secondary | ICD-10-CM | POA: Diagnosis not present

## 2021-11-22 DIAGNOSIS — M25562 Pain in left knee: Secondary | ICD-10-CM | POA: Diagnosis not present

## 2021-11-22 DIAGNOSIS — M6281 Muscle weakness (generalized): Secondary | ICD-10-CM | POA: Diagnosis not present

## 2021-11-22 DIAGNOSIS — M25662 Stiffness of left knee, not elsewhere classified: Secondary | ICD-10-CM | POA: Diagnosis not present

## 2021-11-22 LAB — BASIC METABOLIC PANEL
BUN/Creatinine Ratio: 17 (ref 12–28)
BUN: 14 mg/dL (ref 8–27)
CO2: 22 mmol/L (ref 20–29)
Calcium: 10.4 mg/dL — ABNORMAL HIGH (ref 8.7–10.3)
Chloride: 97 mmol/L (ref 96–106)
Creatinine, Ser: 0.82 mg/dL (ref 0.57–1.00)
Glucose: 107 mg/dL — ABNORMAL HIGH (ref 70–99)
Potassium: 4.1 mmol/L (ref 3.5–5.2)
Sodium: 137 mmol/L (ref 134–144)
eGFR: 74 mL/min/{1.73_m2} (ref >59)

## 2021-11-22 NOTE — Therapy (Signed)
OUTPATIENT PHYSICAL THERAPY TREATMENT/PROGRESS NOTE  Patient Name: Bridget [name] MRN: [id number] DOB:09/21/1945, 76 y.o., female Today's Date: 11/22/2021  END OF SESSION:   PT End of Session - 11/22/21 1349     Visit Number 9    Number of Visits 15    Progress Note Due on Visit 10    PT Start Time 1345    PT Stop Time 1438    PT Time Calculation (min) 53 min    Activity Tolerance Patient limited by pain;Patient tolerated treatment well    Behavior During Therapy WFL for tasks assessed/performed             Past Medical History:  Diagnosis Date   Arthritis    Cancer (HCC)    basal cell cancer removed on face   Hypertension    Seasonal allergies    Sleep apnea    Past Surgical History:  Procedure Laterality Date   BREAST EXCISIONAL BIOPSY Left    CHOLECYSTECTOMY  02/2010   KNEE SURGERY Right    Meniscal tear   OOPHORECTOMY  01/2003   RSO AT [facility]   TOTAL KNEE ARTHROPLASTY Left 10/18/2021   Procedure: LEFT TOTAL KNEE ARTHROPLASTY;  Surgeon: Blackman, Christopher Y, MD;  Location: [facility];  Service: Orthopedics;  Laterality: Left;   VAGINAL HYSTERECTOMY  01/2003   [facility] A&P REPAIR   WISDOM TOOTH EXTRACTION     Patient Active Problem List   Diagnosis Date Noted   Status post total left knee replacement 10/18/2021   Unilateral primary osteoarthritis, left knee 07/31/2021   Degenerative arthritis of left knee 11/24/2019   Secondary hypercoagulable state (HCC) 01/26/2019   Greater trochanteric bursitis of right hip 09/23/2018   Lumbar radiculopathy 08/12/2018   Gluteal tendonitis of right buttock 07/29/2018   Atrial fibrillation (HCC) 07/21/2018   Arthritis of left acromioclavicular joint 04/26/2018   Partial nontraumatic tear of left rotator cuff 04/26/2018   Hypertension    Seasonal allergies      THERAPY DIAG:  Difficulty in walking, not elsewhere classified  Localized edema  Muscle weakness (generalized)  Stiffness of left knee, not elsewhere  classified  Left knee pain, unspecified chronicity  PCP: Sharon Wolters, MD   REFERRING PROVIDER: Christopher Y Blackman, MD   REFERRING DIAG: M17.12 (ICD-10-CM) - Unilateral primary osteoarthritis, left knee    THERAPY DIAG:  Difficulty in walking, not elsewhere classified   Localized edema   Muscle weakness (generalized)   Stiffness of left knee, not elsewhere classified   Left knee pain, unspecified chronicity   Rationale for Evaluation and Treatment Rehabilitation   ONSET DATE: Surgery 10/18/2021   SUBJECTIVE:    SUBJECTIVE STATEMENT: [name] reports she continues to be good with her diuretic use and HEP.  She reports she has lost a pound since Wednesday, hopefully fluid.  She did mention her cat scratched or bit her yesterday and her leg has been "weeping" ever since, demonstrating the significance of her fluid retention in her left leg.  PERTINENT HISTORY: OA, skin cancer, HTN, previous R knee scope, atrial fibrillation PAIN:  NPRS scale: 3-7/10 this week Pain location: Lt knee Pain description: tightness, throbbing, aching  Aggravating factors: standing, colder weather  Relieving factors: ice, elevation    PRECAUTIONS: None   WEIGHT BEARING RESTRICTIONS: No   FALLS:  Has patient fallen in last 6 months? No   LIVING ENVIRONMENT: Lives with: lives with their spouse Lives in: House/apartment Stairs: 1 stair Has following equipment at home: Walker - 2 wheeled     OCCUPATION: Retired   PLOF: Independent   PATIENT GOALS: Return to gardening, visiting grand kids     OBJECTIVE:    DIAGNOSTIC FINDINGS:  10/31/2021 review of FINDINGS: Changes of left knee replacement. No hardware or bony complicating feature. Soft tissue and joint space gas noted.   PATIENT SURVEYS:  11/20/2021 FOTO 31 (Goal 60)  10/31/2021 FOTO 38 (Goal 60 in 15 visits)   COGNITION: 10/31/2021 Overall cognitive status: Within functional limits for tasks assessed                          SENSATION: 10/31/2021 No complaints of peripheral pain or paresthesias   EDEMA:  10/31/2021 Noted but not objectively assessed     LOWER EXTREMITY ROM:   Active ROM Right 10/31/2021 Left 10/31/2021 Left 11/01/2021 Left 11/13/2021 Left 11/20/2021  Hip flexion         Hip extension         Hip abduction         Hip adduction         Hip internal rotation         Hip external rotation         Knee flexion 112 40 50 58 62  Knee extension 0 -3 -3 -3 -2  Ankle dorsiflexion         Ankle plantarflexion         Ankle inversion         Ankle eversion          (Blank rows = not tested)   LOWER EXTREMITY MMT:   MMT Right 10/31/2021 Left 10/31/2021  Hip flexion      Hip extension      Hip abduction      Hip adduction      Hip internal rotation      Hip external rotation      Knee flexion      Knee extension 4/5 2/5  Ankle dorsiflexion      Ankle plantarflexion      Ankle inversion      Ankle eversion       (Blank rows = not tested)   GAIT: 10/31/2021 Distance walked: 50 feet Assistive device utilized: Environmental consultant - 2 wheeled Level of assistance:  Needs walker to reduce L knee WB Comments: Discussed the need for continued AD use to manage edema    TODAY'S TREATMENT                                                                          DATE:  11/22/2021 Recumbent bike Seat 6 AAROM only (unable to complete a revolution) for 5 minutes -Quadriceps 2 sets of 10 reps for 5 seconds (toes back, press down and tighten thighs) Tailgate Lt knee flexion 1 minute Contract-relax knee flexion stretch with PT (seated with towel roll under L distal thigh), push into extension for 5 seconds, bend back for 10 seconds, then repeat 10X, needed rests between reps due to pain  Functional Activities for stairs and sit to stand (in a very limited range today due to edema): Double leg press 68# 15X slow eccentrics Single (Left) leg press 25# 10X slow eccentrics   Vasopneumatic Left  knee  34* High Pressure 15 minutes   11/20/2021 Recumbent bike Seat 6 AAROM only (unable to complete a revolution) for 5 minutes Quadriceps 2 sets of 10 reps for 5 seconds (toes back, press down and tighten thighs) Tailgate Lt knee flexion 1 minute Contract-relax knee flexion stretch with PT (seated with towel roll under L distal thigh), push into extension for 5 seconds, bend back for 10 seconds, then repeat 10X   Functional Activities for stairs and sit to stand (in a very limited range today due to edema): Double leg press 68# 15X slow eccentrics Single (Left) leg press 25# 20X slow eccentrics   Vasopneumatic Left knee 34* Medium Pressure 10 minutes   11/15/2021 Recumbent bike Seat 6 AAROM only (unable to complete a revolution) for 8 minutes Quadriceps sets 10 reps for 5 seconds (toes back, press down and tighten thighs) Tailgate knee flexion 2 minutes   Functional Activities for stairs and sit to stand (in a very limited range today due to edema): Double leg press 62# 15X slow eccentrics Single (Left) leg press 25# 20X slow eccentrics   Vasopneumatic Left knee 34* Medium Pressure 10 minutes   PATIENT EDUCATION:  11/04/2021 Education details: HEP update Person educated: Patient Education method: Explanation, Demonstration, Tactile cues, Verbal cues, and Handouts Education comprehension: verbalized understanding, returned demonstration, verbal cues required, tactile cues required   HOME EXERCISE PROGRAM: Access Code: [email or web address] URL: [email or web address] Date: 11/04/2021 Prepared by: Michael Wright  Exercises - Supine Quadricep Sets  - 5 x daily - 7 x weekly - 2 sets - 10 reps - 5 second hold - Seated Knee Flexion AAROM  - 5 x daily - 7 x weekly - 1 sets - 1 reps - 3 minutes hold - Seated Long Arc Quad (Mirrored)  - 3-5 x daily - 7 x weekly - 1-2 sets - 10 reps - 2 hold - Ankle Pumps in Elevation  - 2-3 x daily - 7 x weekly - 1-2 sets - 10 reps   ASSESSMENT:    CLINICAL IMPRESSION:  Flexion AROM is still limited by edema/fluid.  She is taking her diuretic as prescribed.   Her cat bit or scratched her yesterday and her leg has been weeping fluid ever since, demonstrating the significant fluid retention on her left side.  We discussed the importance of following-up with her GP about the cat scratch/bite due to infection risk and she mentioned her daughter (who is a NP in [place]) has/will be keeping an eye on her.  Flexion AROM and edema control remain the priorities with Annah's PT.  We reviewed the importance of doing quadriceps sets in the adjustable bed with her head a bit lower than her feet to help with gravity assisted fluid/edema removal.  Continue current emphasis on edema/fluid reduction, quadriceps strength and flexion AROM.    OBJECTIVE IMPAIRMENTS: Abnormal gait, decreased activity tolerance, decreased balance, decreased coordination, decreased endurance, decreased knowledge of condition, difficulty walking, decreased ROM, decreased strength, decreased safety awareness, increased edema, impaired perceived functional ability, obesity, and pain.    ACTIVITY LIMITATIONS: carrying, bending, sitting, standing, squatting, stairs, bed mobility, dressing, and locomotion level   PARTICIPATION LIMITATIONS: cleaning, driving, shopping, community activity, and yard work   PERSONAL FACTORS: OA, skin cancer, HTN, previous Rt knee scope, atrial fibrillation are also affecting patient's functional outcome.    REHAB POTENTIAL: Good   CLINICAL DECISION MAKING: Stable/uncomplicated   EVALUATION COMPLEXITY: Low     GOALS: Goals reviewed with patient? Yes      SHORT TERM GOALS: Target date: 11/28/2021  Rudy will improve Lt knee flexion AROM to 90 degrees Baseline: 40 degrees Goal status: On Going 11/20/2021   2.  Neyla will be independent and compliant 5/7 days with her starter HEP for 2 weeks Baseline:  Goal status: Met 11/13/2021     LONG  TERM GOALS: Target date: 01/09/2022    Improve FOTO to 60 Baseline: 38 Goal status: INITIAL   2.  Tarhonda will report Lt knee pain consistently 3/10 or better on the VAS Baseline: Can be 7/10 Goal status: On Going 11/20/2021   3.  Improve Lt knee AROM to 2-0-105 or better Baseline: 3-0-40 Goal status: Partially Met 11/20/2021   4.  Improve Lt knee strength as assessed by MMT, hand-held dynamometer and transition to a cane or no AD Baseline: Walker and poor (2/5 MMT) strength Goal status: On Going 11/20/2021   5.  Emmarae will be compliant and independent with her long-term HEP at DC Baseline: Started 10/31/2021 Goal status: On Going 11/20/2021     PLAN:   PT FREQUENCY:  2-3x/week   PT DURATION: 10 weeks   PLANNED INTERVENTIONS: Therapeutic exercises, Therapeutic activity, Neuromuscular re-education, Balance training, Gait training, Patient/Family education, Self Care, Joint mobilization, Stair training, Electrical stimulation, Cryotherapy, Vasopneumatic device, and Manual therapy   PLAN FOR NEXT SESSION: Edema/fluid control, quadriceps strength and FLEXION AROM.  Progress note sent.   Farley Ly PT, MPT 11/22/2021, 2:29 PM

## 2021-11-22 NOTE — Telephone Encounter (Signed)
Patient dropped off list of daily weights 11/10 - 225.6 11/11 - 224.6 11/12 - 224.3 11/14 - 225.8 11/15 -225.6 11/16 - 226.4

## 2021-11-25 ENCOUNTER — Encounter: Payer: Self-pay | Admitting: Physical Therapy

## 2021-11-25 ENCOUNTER — Ambulatory Visit (INDEPENDENT_AMBULATORY_CARE_PROVIDER_SITE_OTHER): Payer: Medicare Other | Admitting: Physical Therapy

## 2021-11-25 DIAGNOSIS — G8929 Other chronic pain: Secondary | ICD-10-CM

## 2021-11-25 DIAGNOSIS — R262 Difficulty in walking, not elsewhere classified: Secondary | ICD-10-CM

## 2021-11-25 DIAGNOSIS — M6281 Muscle weakness (generalized): Secondary | ICD-10-CM

## 2021-11-25 DIAGNOSIS — M25562 Pain in left knee: Secondary | ICD-10-CM

## 2021-11-25 DIAGNOSIS — R600 Localized edema: Secondary | ICD-10-CM | POA: Diagnosis not present

## 2021-11-25 DIAGNOSIS — M25662 Stiffness of left knee, not elsewhere classified: Secondary | ICD-10-CM | POA: Diagnosis not present

## 2021-11-25 DIAGNOSIS — R252 Cramp and spasm: Secondary | ICD-10-CM

## 2021-11-25 DIAGNOSIS — M545 Low back pain, unspecified: Secondary | ICD-10-CM

## 2021-11-25 NOTE — Therapy (Signed)
OUTPATIENT PHYSICAL THERAPY TREATMENT  Patient Name: Bridget [name] MRN: [id number] DOB:Jul 24, 1945, 76 y.o., female Today's Date: 11/25/2021  END OF SESSION:   PT End of Session - 11/25/21 1351     Visit Number 10    Number of Visits 15    Progress Note Due on Visit 18    PT Start Time 1346    PT Stop Time 1424    PT Time Calculation (min) 38 min    Activity Tolerance Patient limited by pain;Patient tolerated treatment well    Behavior During Therapy [facility] for tasks assessed/performed              Past Medical History:  Diagnosis Date   Arthritis    Cancer ([place])    basal cell cancer removed on face   Hypertension    Seasonal allergies    Sleep apnea    Past Surgical History:  Procedure Laterality Date   BREAST EXCISIONAL BIOPSY Left    CHOLECYSTECTOMY  02/2010   KNEE SURGERY Right    Meniscal tear   OOPHORECTOMY  01/2003   RSO AT [facility]   TOTAL KNEE ARTHROPLASTY Left 10/18/2021   Procedure: LEFT TOTAL KNEE ARTHROPLASTY;  Surgeon: Mcarthur Rossetti, MD;  Location: [facility];  Service: Orthopedics;  Laterality: Left;   VAGINAL HYSTERECTOMY  01/2003   [facility]O A&P REPAIR   WISDOM TOOTH EXTRACTION     Patient Active Problem List   Diagnosis Date Noted   Status post total left knee replacement 10/18/2021   Unilateral primary osteoarthritis, left knee 07/31/2021   Degenerative arthritis of left knee 11/24/2019   Secondary hypercoagulable state ([place]) 01/26/2019   Greater trochanteric bursitis of right hip 09/23/2018   Lumbar radiculopathy 08/12/2018   Gluteal tendonitis of right buttock 07/29/2018   Atrial fibrillation ([place]) 07/21/2018   Arthritis of left acromioclavicular joint 04/26/2018   Partial nontraumatic tear of left rotator cuff 04/26/2018   Hypertension    Seasonal allergies      THERAPY DIAG:  Difficulty in walking, not elsewhere classified  Muscle weakness (generalized)  Localized edema  Stiffness of left knee, not elsewhere  classified  Left knee pain, unspecified chronicity  Chronic bilateral low back pain, unspecified whether sciatica present  Cramp and spasm  PCP: Jonathon Jordan, MD   REFERRING PROVIDER: Mcarthur Rossetti, MD   REFERRING DIAG: [phone] (ICD-10-CM) - Unilateral primary osteoarthritis, left knee    EVAL THERAPY DIAG:  Difficulty in walking, not elsewhere classified   Localized edema   Muscle weakness (generalized)   Stiffness of left knee, not elsewhere classified   Left knee pain, unspecified chronicity   Rationale for Evaluation and Treatment Rehabilitation   ONSET DATE: Surgery 10/18/2021   SUBJECTIVE:    SUBJECTIVE STATEMENT: Doing well, swelling is still an issue impacting her mobility and progress.  PERTINENT HISTORY: OA, skin cancer, HTN, previous R knee scope, atrial fibrillation PAIN:  NPRS scale: 4/10  Pain location: Lt knee Pain description: tightness, throbbing, aching  Aggravating factors: standing, colder weather  Relieving factors: ice, elevation    PRECAUTIONS: None   WEIGHT BEARING RESTRICTIONS: No   FALLS:  Has patient fallen in last 6 months? No   LIVING ENVIRONMENT: Lives with: lives with their spouse Lives in: House/apartment Stairs: 1 stair Has following equipment at home: Environmental consultant - 2 wheeled   OCCUPATION: Retired   PLOF: Independent   PATIENT GOALS: Return to gardening, visiting grand kids     OBJECTIVE:    DIAGNOSTIC FINDINGS:  10/31/2021  review of FINDINGS: Changes of left knee replacement. No hardware or bony complicating feature. Soft tissue and joint space gas noted.   PATIENT SURVEYS:  11/20/2021 FOTO 31 (Goal 60)  10/31/2021 FOTO 38 (Goal 60 in 15 visits)   COGNITION: 10/31/2021 Overall cognitive status: Within functional limits for tasks assessed                         SENSATION: 10/31/2021 No complaints of peripheral pain or paresthesias   EDEMA:  10/31/2021 Noted but not objectively assessed      LOWER EXTREMITY ROM:   Active ROM Right 10/31/2021 Left 10/31/2021 Left 11/01/2021 Left 11/13/2021 Left 11/20/2021 Left 11/25/21  Knee flexion 112 40 50 58 62 62  Knee extension 0 -3 -3 -3 -2 -3 (LAQ)   (Blank rows = not tested)   LOWER EXTREMITY MMT:   MMT Right 10/31/2021 Left 10/31/2021  Knee flexion      Knee extension 4/5 2/5   (Blank rows = not tested)   GAIT: 10/31/2021 Distance walked: 50 feet Assistive device utilized: Environmental consultant - 2 wheeled Level of assistance:  Needs walker to reduce L knee WB Comments: Discussed the need for continued AD use to manage edema    TODAY'S TREATMENT                                                                          DATE:  11/25/21 TherEx Recumbent bike seat 6 x 8 min; partial revolutions for ROM Tailgate knee flexion x 2 min AA knee flexion sitting 10 x 10 sec hold, RLE providing overpressure Seated LAQ 2x10; 3-5 sec hold on Lt Long sitting AA heel slides x 10 reps with strap; Lt ROM measurements as noted above Sit to/from stand with min UE support from elevated mat x 10 reps  Manual PROM sitting knee flexion to tolerance  11/22/2021 Recumbent bike Seat 6 AAROM only (unable to complete a revolution) for 5 minutes -Quadriceps 2 sets of 10 reps for 5 seconds (toes back, press down and tighten thighs) Tailgate Lt knee flexion 1 minute Contract-relax knee flexion stretch with PT (seated with towel roll under L distal thigh), push into extension for 5 seconds, bend back for 10 seconds, then repeat 10X, needed rests between reps due to pain  Functional Activities for stairs and sit to stand (in a very limited range today due to edema): Double leg press 68# 15X slow eccentrics Single (Left) leg press 25# 10X slow eccentrics   Vasopneumatic Left knee 34* High Pressure 15 minutes   11/20/2021 Recumbent bike Seat 6 AAROM only (unable to complete a revolution) for 5 minutes Quadriceps 2 sets of 10 reps for 5 seconds (toes back,  press down and tighten thighs) Tailgate Lt knee flexion 1 minute Contract-relax knee flexion stretch with PT (seated with towel roll under L distal thigh), push into extension for 5 seconds, bend back for 10 seconds, then repeat 10X   Functional Activities for stairs and sit to stand (in a very limited range today due to edema): Double leg press 68# 15X slow eccentrics Single (Left) leg press 25# 20X slow eccentrics   Vasopneumatic Left knee 34* Medium Pressure 10 minutes  11/15/2021 Recumbent bike Seat 6 AAROM only (unable to complete a revolution) for 8 minutes Quadriceps sets 10 reps for 5 seconds (toes back, press down and tighten thighs) Tailgate knee flexion 2 minutes   Functional Activities for stairs and sit to stand (in a very limited range today due to edema): Double leg press 62# 15X slow eccentrics Single (Left) leg press 25# 20X slow eccentrics   Vasopneumatic Left knee 34* Medium Pressure 10 minutes   PATIENT EDUCATION:  11/04/2021 Education details: HEP update Person educated: Patient Education method: Explanation, Demonstration, Tactile cues, Verbal cues, and Handouts Education comprehension: verbalized understanding, returned demonstration, verbal cues required, tactile cues required   HOME EXERCISE PROGRAM: Access Code: [email or web address] URL: [email or web address] Date: 11/04/2021 Prepared by: Scot Jun  Exercises - Supine Quadricep Sets  - 5 x daily - 7 x weekly - 2 sets - 10 reps - 5 second hold - Seated Knee Flexion AAROM  - 5 x daily - 7 x weekly - 1 sets - 1 reps - 3 minutes hold - Seated Long Arc Quad (Mirrored)  - 3-5 x daily - 7 x weekly - 1-2 sets - 10 reps - 2 hold - Ankle Pumps in Elevation  - 2-3 x daily - 7 x weekly - 1-2 sets - 10 reps   ASSESSMENT:   CLINICAL IMPRESSION: Pt continues to be limited by edema in LLE affecting ROM and functional progress.  Overall slow progress with PT and anticipate at least 4 more weeks of PT to  maximize function.  Recommended thigh high stockings to help with swelling.    OBJECTIVE IMPAIRMENTS: Abnormal gait, decreased activity tolerance, decreased balance, decreased coordination, decreased endurance, decreased knowledge of condition, difficulty walking, decreased ROM, decreased strength, decreased safety awareness, increased edema, impaired perceived functional ability, obesity, and pain.    ACTIVITY LIMITATIONS: carrying, bending, sitting, standing, squatting, stairs, bed mobility, dressing, and locomotion level   PARTICIPATION LIMITATIONS: cleaning, driving, shopping, community activity, and yard work   PERSONAL FACTORS: OA, skin cancer, HTN, previous Rt knee scope, atrial fibrillation are also affecting patient's functional outcome.    REHAB POTENTIAL: Good   CLINICAL DECISION MAKING: Stable/uncomplicated   EVALUATION COMPLEXITY: Low     GOALS: Goals reviewed with patient? Yes    SHORT TERM GOALS: Target date: 11/28/2021  Inge will improve Lt knee flexion AROM to 90 degrees Baseline: 40 degrees Goal status: NOT MET 11/25/21   2.  Karma will be independent and compliant 5/7 days with her starter HEP for 2 weeks Baseline:  Goal status: Met 11/13/2021     LONG TERM GOALS: Target date: 01/09/2022    Improve FOTO to 60 Baseline: 38 Goal status: INITIAL   2.  Jerry will report Lt knee pain consistently 3/10 or better on the VAS Baseline: Can be 7/10 Goal status: On Going 11/20/2021   3.  Improve Lt knee AROM to 2-0-105 or better Baseline: 3-0-40 Goal status: Partially Met 11/20/2021   4.  Improve Lt knee strength as assessed by MMT, hand-held dynamometer and transition to a cane or no AD Baseline: Walker and poor (2/5 MMT) strength Goal status: On Going 11/20/2021   5.  [place] will be compliant and independent with her long-term HEP at DC Baseline: Started 10/31/2021 Goal status: On Going 11/20/2021     PLAN:   PT FREQUENCY:  2-3x/week   PT DURATION:  10 weeks   PLANNED INTERVENTIONS: Therapeutic exercises, Therapeutic activity, Neuromuscular re-education, Balance training, Gait training, Patient/Family education, Self Care, Joint  mobilization, Stair training, Electrical stimulation, Cryotherapy, Vasopneumatic device, and Manual therapy   PLAN FOR NEXT SESSION: Edema/fluid control, quadriceps strength and FLEXION AROM.     Laureen Abrahams, PT, DPT 11/25/21 2:26 PM

## 2021-11-27 ENCOUNTER — Ambulatory Visit (INDEPENDENT_AMBULATORY_CARE_PROVIDER_SITE_OTHER): Payer: Medicare Other | Admitting: Orthopaedic Surgery

## 2021-11-27 ENCOUNTER — Ambulatory Visit (INDEPENDENT_AMBULATORY_CARE_PROVIDER_SITE_OTHER): Payer: Medicare Other | Admitting: Rehabilitative and Restorative Service Providers"

## 2021-11-27 ENCOUNTER — Encounter: Payer: Self-pay | Admitting: Orthopaedic Surgery

## 2021-11-27 ENCOUNTER — Telehealth: Payer: Self-pay | Admitting: *Deleted

## 2021-11-27 ENCOUNTER — Encounter: Payer: Self-pay | Admitting: Rehabilitative and Restorative Service Providers"

## 2021-11-27 DIAGNOSIS — M25562 Pain in left knee: Secondary | ICD-10-CM

## 2021-11-27 DIAGNOSIS — R600 Localized edema: Secondary | ICD-10-CM | POA: Diagnosis not present

## 2021-11-27 DIAGNOSIS — R262 Difficulty in walking, not elsewhere classified: Secondary | ICD-10-CM | POA: Diagnosis not present

## 2021-11-27 DIAGNOSIS — M6281 Muscle weakness (generalized): Secondary | ICD-10-CM | POA: Diagnosis not present

## 2021-11-27 DIAGNOSIS — M25662 Stiffness of left knee, not elsewhere classified: Secondary | ICD-10-CM

## 2021-11-27 DIAGNOSIS — M24662 Ankylosis, left knee: Secondary | ICD-10-CM

## 2021-11-27 DIAGNOSIS — Z96652 Presence of left artificial knee joint: Secondary | ICD-10-CM

## 2021-11-27 NOTE — Telephone Encounter (Signed)
Ortho bundle call completed. 

## 2021-11-27 NOTE — Progress Notes (Signed)
The patient is 6 weeks status post a left total knee arthroplasty.  She has been very diligent with participating in physical therapy.  However, due to the extent of the pain that she is experiencing with her knee she is not been able to get her knee flexing as well as she would like or we would like.  On exam her extension is almost full but her flexion is to maybe 60 to 70 degrees.  All the notes from therapy have been viewed as well.  Her daughter is with her today.  The patient is very tearful.  She has tried Lasix as well due to distal peripheral edema.  I am recommending a manipulation under anesthesia of her left knee.  I explained in detail what this involves.  I think this is the best option for getting her knee bending and moving.  I explained the risks and benefits of this type of procedure and the rationale behind proceeding.  I gave her reassurance that I believe that when she has a manipulation she is going to be able to get the knee bending even better.  I do not believe there is any other options right now given the fact that when no the best outcomes are getting the knee flexing and extending.  We will work on getting her scheduled for manipulation under anesthesia.  All questions and concerns were answered and addressed.  When we see her next follow-up I would like a standing AP and lateral of her left operative knee.

## 2021-11-27 NOTE — Therapy (Signed)
OUTPATIENT PHYSICAL THERAPY TREATMENT  Patient Name: Bridget [name] MRN: [id number] DOB:20-Apr-1945, 76 y.o., female Today's Date: 11/27/2021  END OF SESSION:   PT End of Session - 11/27/21 1352     Visit Number 11    Number of Visits 15    Progress Note Due on Visit 18    PT Start Time 7829    PT Stop Time 1438    PT Time Calculation (min) 53 min    Activity Tolerance Patient limited by pain;Patient tolerated treatment well;No increased pain    Behavior During Therapy WFL for tasks assessed/performed             Past Medical History:  Diagnosis Date   Arthritis    Cancer ([place])    basal cell cancer removed on face   Hypertension    Seasonal allergies    Sleep apnea    Past Surgical History:  Procedure Laterality Date   BREAST EXCISIONAL BIOPSY Left    CHOLECYSTECTOMY  02/2010   KNEE SURGERY Right    Meniscal tear   OOPHORECTOMY  01/2003   RSO AT [facility]   TOTAL KNEE ARTHROPLASTY Left 10/18/2021   Procedure: LEFT TOTAL KNEE ARTHROPLASTY;  Surgeon: Mcarthur Rossetti, MD;  Location: [facility];  Service: Orthopedics;  Laterality: Left;   VAGINAL HYSTERECTOMY  01/2003   TVH,RSO A&P REPAIR   WISDOM TOOTH EXTRACTION     Patient Active Problem List   Diagnosis Date Noted   Status post total left knee replacement 10/18/2021   Degenerative arthritis of left knee 11/24/2019   Secondary hypercoagulable state ([place]) 01/26/2019   Greater trochanteric bursitis of right hip 09/23/2018   Lumbar radiculopathy 08/12/2018   Gluteal tendonitis of right buttock 07/29/2018   Atrial fibrillation ([place]) 07/21/2018   Arthritis of left acromioclavicular joint 04/26/2018   Partial nontraumatic tear of left rotator cuff 04/26/2018   Hypertension    Seasonal allergies      THERAPY DIAG:  Difficulty in walking, not elsewhere classified  Muscle weakness (generalized)  Localized edema  Stiffness of left knee, not elsewhere classified  Left knee pain, unspecified  chronicity  PCP: Jonathon Jordan, MD   REFERRING PROVIDER: Mcarthur Rossetti, MD   REFERRING DIAG: [phone] (ICD-10-CM) - Unilateral primary osteoarthritis, left knee    EVAL THERAPY DIAG:  Difficulty in walking, not elsewhere classified   Localized edema   Muscle weakness (generalized)   Stiffness of left knee, not elsewhere classified   Left knee pain, unspecified chronicity   Rationale for Evaluation and Treatment Rehabilitation   ONSET DATE: Surgery 10/18/2021   SUBJECTIVE:    SUBJECTIVE STATEMENT: Bridget [name] is having a manipulation next Thursday 12/05/2021.  We will see her the same day afterwards.  PERTINENT HISTORY: OA, skin cancer, HTN, previous R knee scope, atrial fibrillation PAIN:  NPRS scale: 3-4/10  Pain location: Lt knee Pain description: tightness, throbbing, aching  Aggravating factors: standing, colder weather  Relieving factors: ice, elevation    PRECAUTIONS: None   WEIGHT BEARING RESTRICTIONS: No   FALLS:  Has patient fallen in last 6 months? No   LIVING ENVIRONMENT: Lives with: lives with their spouse Lives in: House/apartment Stairs: 1 stair Has following equipment at home: Environmental consultant - 2 wheeled   OCCUPATION: Retired   PLOF: Independent   PATIENT GOALS: Return to gardening, visiting grand kids     OBJECTIVE:    DIAGNOSTIC FINDINGS:  10/31/2021 review of FINDINGS: Changes of left knee replacement. No hardware or bony complicating feature. Soft tissue  and joint space gas noted.   PATIENT SURVEYS:  11/20/2021 FOTO 31 (Goal 60)  10/31/2021 FOTO 38 (Goal 60 in 15 visits)   COGNITION: 10/31/2021 Overall cognitive status: Within functional limits for tasks assessed                         SENSATION: 10/31/2021 No complaints of peripheral pain or paresthesias   EDEMA:  10/31/2021 Noted but not objectively assessed     LOWER EXTREMITY ROM:   Active ROM Right 10/31/2021 Left 10/31/2021 Left 11/01/2021 Left 11/13/2021 Left  11/20/2021 Left 11/25/21 Left 11/27/2021  Knee flexion 112 40 50 58 62 62 73 PROM  Knee extension 0 -3 -3 -3 -2 -3 (LAQ) -2 PROM    (Blank rows = not tested)   LOWER EXTREMITY MMT:   MMT Right 10/31/2021 Left 10/31/2021  Knee flexion      Knee extension 4/5 2/5   (Blank rows = not tested)   GAIT: 10/31/2021 Distance walked: 50 feet Assistive device utilized: Environmental consultant - 2 wheeled Level of assistance:  Needs walker to reduce L knee WB Comments: Discussed the need for continued AD use to manage edema    TODAY'S TREATMENT                                                                          DATE:  11/27/2021 Recumbent bike Seat 6 AAROM only (unable to complete a revolution) for 8 minutes Quadriceps 2 sets of 10 reps for 5 seconds (toes back, press down and tighten thighs) with PT overpressure Tailgate Lt knee flexion 1 minute Contract-relax knee flexion stretch with PT (seated with towel roll under L distal thigh), push into extension for 5 seconds, bend back for 10 seconds, then repeat 2 sets of 10 reps, rests PRN between reps due to pain   Vasopneumatic Left knee 34* High Pressure 15 minutes   11/25/21 TherEx Recumbent bike seat 6 x 8 min; partial revolutions for ROM Tailgate knee flexion x 2 min AA knee flexion sitting 10 x 10 sec hold, RLE providing overpressure Seated LAQ 2x10; 3-5 sec hold on Lt Long sitting AA heel slides x 10 reps with strap; Lt ROM measurements as noted above Sit to/from stand with min UE support from elevated mat x 10 reps  Manual PROM sitting knee flexion to tolerance   11/22/2021 Recumbent bike Seat 6 AAROM only (unable to complete a revolution) for 5 minutes -Quadriceps 2 sets of 10 reps for 5 seconds (toes back, press down and tighten thighs) Tailgate Lt knee flexion 1 minute Contract-relax knee flexion stretch with PT (seated with towel roll under L distal thigh), push into extension for 5 seconds, bend back for 10 seconds, then repeat  10X, needed rests between reps due to pain  Functional Activities for stairs and sit to stand (in a very limited range today due to edema): Double leg press 68# 15X slow eccentrics Single (Left) leg press 25# 10X slow eccentrics   Vasopneumatic Left knee 34* High Pressure 15 minutes   PATIENT EDUCATION:  11/04/2021 Education details: HEP update Person educated: Patient Education method: Explanation, Demonstration, Tactile cues, Verbal cues, and Handouts Education comprehension: verbalized understanding, returned demonstration, verbal  cues required, tactile cues required   HOME EXERCISE PROGRAM: Access Code: [email or web address] URL: [email or web address] Date: 11/04/2021 Prepared by: Scot Jun  Exercises - Supine Quadricep Sets  - 5 x daily - 7 x weekly - 2 sets - 10 reps - 5 second hold - Seated Knee Flexion AAROM  - 5 x daily - 7 x weekly - 1 sets - 1 reps - 3 minutes hold - Seated Long Arc Quad (Mirrored)  - 3-5 x daily - 7 x weekly - 1-2 sets - 10 reps - 2 hold - Ankle Pumps in Elevation  - 2-3 x daily - 7 x weekly - 1-2 sets - 10 reps   ASSESSMENT:   CLINICAL IMPRESSION: Aradhana had noticeably less edema/fluid on her left side today as compared to a week ago.  PROM was 73 degrees today vs AROM 62 degrees last week.  She is scheduled for manipulation 1 week from tomorrow and this combined with continued edema/fluid loss and consistent HEP compliance should allow her to meet long-term goals.    OBJECTIVE IMPAIRMENTS: Abnormal gait, decreased activity tolerance, decreased balance, decreased coordination, decreased endurance, decreased knowledge of condition, difficulty walking, decreased ROM, decreased strength, decreased safety awareness, increased edema, impaired perceived functional ability, obesity, and pain.    ACTIVITY LIMITATIONS: carrying, bending, sitting, standing, squatting, stairs, bed mobility, dressing, and locomotion level   PARTICIPATION LIMITATIONS:  cleaning, driving, shopping, community activity, and yard work   PERSONAL FACTORS: OA, skin cancer, HTN, previous Rt knee scope, atrial fibrillation are also affecting patient's functional outcome.    REHAB POTENTIAL: Good   CLINICAL DECISION MAKING: Stable/uncomplicated   EVALUATION COMPLEXITY: Low     GOALS: Goals reviewed with patient? Yes    SHORT TERM GOALS: Target date: 11/28/2021  Roshonda will improve Lt knee flexion AROM to 90 degrees Baseline: 40 degrees Goal status: NOT MET 11/27/21   2.  Neka will be independent and compliant 5/7 days with her starter HEP for 2 weeks Baseline:  Goal status: Met 11/13/2021     LONG TERM GOALS: Target date: 01/09/2022    Improve FOTO to 60 Baseline: 38 Goal status: INITIAL   2.  Lamari will report Lt knee pain consistently 3/10 or better on the VAS Baseline: Can be 7/10 Goal status: On Going 11/27/2021   3.  Improve Lt knee AROM to 2-0-105 or better Baseline: 3-0-40 Goal status: Partially Met 11/27/2021   4.  Improve Lt knee strength as assessed by MMT, hand-held dynamometer and transition to a cane or no AD Baseline: Walker and poor (2/5 MMT) strength Goal status: On Going 11/27/2021   5.  Kosha will be compliant and independent with her long-term HEP at DC Baseline: Started 10/31/2021 Goal status: On Going 11/27/2021     PLAN:   PT FREQUENCY:  2-3x/week   PT DURATION: 10 weeks   PLANNED INTERVENTIONS: Therapeutic exercises, Therapeutic activity, Neuromuscular re-education, Balance training, Gait training, Patient/Family education, Self Care, Joint mobilization, Stair training, Electrical stimulation, Cryotherapy, Vasopneumatic device, and Manual therapy   PLAN FOR NEXT SESSION: Edema/fluid control, quadriceps strength and FLEXION AROM in preparation for manipulation 12/05/2021.   Farley Ly PT, MPT 11/27/21 3:23 PM

## 2021-12-03 ENCOUNTER — Encounter: Payer: Medicare Other | Admitting: Physical Therapy

## 2021-12-03 ENCOUNTER — Ambulatory Visit (INDEPENDENT_AMBULATORY_CARE_PROVIDER_SITE_OTHER): Payer: Medicare Other | Admitting: Rehabilitative and Restorative Service Providers"

## 2021-12-03 ENCOUNTER — Encounter: Payer: Self-pay | Admitting: Rehabilitative and Restorative Service Providers"

## 2021-12-03 DIAGNOSIS — M6281 Muscle weakness (generalized): Secondary | ICD-10-CM

## 2021-12-03 DIAGNOSIS — M25662 Stiffness of left knee, not elsewhere classified: Secondary | ICD-10-CM

## 2021-12-03 DIAGNOSIS — M25562 Pain in left knee: Secondary | ICD-10-CM | POA: Diagnosis not present

## 2021-12-03 DIAGNOSIS — R262 Difficulty in walking, not elsewhere classified: Secondary | ICD-10-CM

## 2021-12-03 DIAGNOSIS — R600 Localized edema: Secondary | ICD-10-CM

## 2021-12-03 NOTE — Therapy (Signed)
OUTPATIENT PHYSICAL THERAPY TREATMENT  Patient Name: Bridget [name] MRN: [id number] DOB:11/07/1945, 76 y.o., female Today's Date: 12/03/2021  END OF SESSION:   PT End of Session - 12/03/21 1441     Visit Number 12    Number of Visits 15    Progress Note Due on Visit 18    PT Start Time 1436    PT Stop Time 1525    PT Time Calculation (min) 49 min    Activity Tolerance Patient limited by pain;Patient tolerated treatment well;No increased pain    Behavior During Therapy WFL for tasks assessed/performed              Past Medical History:  Diagnosis Date   Arthritis    Cancer ([place])    basal cell cancer removed on face   Hypertension    Seasonal allergies    Sleep apnea    Past Surgical History:  Procedure Laterality Date   BREAST EXCISIONAL BIOPSY Left    CHOLECYSTECTOMY  02/2010   KNEE SURGERY Right    Meniscal tear   OOPHORECTOMY  01/2003   RSO AT [facility]   TOTAL KNEE ARTHROPLASTY Left 10/18/2021   Procedure: LEFT TOTAL KNEE ARTHROPLASTY;  Surgeon: Mcarthur Rossetti, MD;  Location: [facility];  Service: Orthopedics;  Laterality: Left;   VAGINAL HYSTERECTOMY  01/2003   TVH,RSO A&P REPAIR   WISDOM TOOTH EXTRACTION     Patient Active Problem List   Diagnosis Date Noted   Status post total left knee replacement 10/18/2021   Degenerative arthritis of left knee 11/24/2019   Secondary hypercoagulable state ([place]) 01/26/2019   Greater trochanteric bursitis of right hip 09/23/2018   Lumbar radiculopathy 08/12/2018   Gluteal tendonitis of right buttock 07/29/2018   Atrial fibrillation ([place]) 07/21/2018   Arthritis of left acromioclavicular joint 04/26/2018   Partial nontraumatic tear of left rotator cuff 04/26/2018   Hypertension    Seasonal allergies      THERAPY DIAG:  Difficulty in walking, not elsewhere classified  Muscle weakness (generalized)  Localized edema  Stiffness of left knee, not elsewhere classified  Left knee pain, unspecified  chronicity  PCP: Jonathon Jordan, MD   REFERRING PROVIDER: Mcarthur Rossetti, MD   REFERRING DIAG: [phone] (ICD-10-CM) - Unilateral primary osteoarthritis, left knee    EVAL THERAPY DIAG:  Difficulty in walking, not elsewhere classified   Localized edema   Muscle weakness (generalized)   Stiffness of left knee, not elsewhere classified   Left knee pain, unspecified chronicity   Rationale for Evaluation and Treatment Rehabilitation   ONSET DATE: Surgery 10/18/2021   SUBJECTIVE:    SUBJECTIVE STATEMENT: Bridget [name] is having a manipulation next Thursday 12/05/2021.  We will see her the same day afterwards.  PERTINENT HISTORY: OA, skin cancer, HTN, previous R knee scope, atrial fibrillation PAIN:  NPRS scale: 3-4/10  Pain location: Lt knee Pain description: tightness, throbbing, aching  Aggravating factors: standing, colder weather  Relieving factors: ice, elevation    PRECAUTIONS: None   WEIGHT BEARING RESTRICTIONS: No   FALLS:  Has patient fallen in last 6 months? No   LIVING ENVIRONMENT: Lives with: lives with their spouse Lives in: House/apartment Stairs: 1 stair Has following equipment at home: Environmental consultant - 2 wheeled   OCCUPATION: Retired   PLOF: Independent   PATIENT GOALS: Return to gardening, visiting grand kids     OBJECTIVE:    DIAGNOSTIC FINDINGS:  10/31/2021 review of FINDINGS: Changes of left knee replacement. No hardware or bony complicating feature. Soft  tissue and joint space gas noted.   PATIENT SURVEYS:  11/20/2021 FOTO 31 (Goal 60)  10/31/2021 FOTO 38 (Goal 60 in 15 visits)   COGNITION: 10/31/2021 Overall cognitive status: Within functional limits for tasks assessed                         SENSATION: 10/31/2021 No complaints of peripheral pain or paresthesias   EDEMA:  10/31/2021 Noted but not objectively assessed     LOWER EXTREMITY ROM:   Active ROM Right 10/31/2021 Left 10/31/2021 Left 11/01/2021 Left 11/13/2021 Left  11/20/2021 Left 11/25/21 Left 11/27/2021  Knee flexion 112 40 50 58 62 62 73 PROM  Knee extension 0 -3 -3 -3 -2 -3 (LAQ) -2 PROM    (Blank rows = not tested)   LOWER EXTREMITY MMT:   MMT Right 10/31/2021 Left 10/31/2021  Knee flexion      Knee extension 4/5 2/5   (Blank rows = not tested)   GAIT: 10/31/2021 Distance walked: 50 feet Assistive device utilized: Environmental consultant - 2 wheeled Level of assistance:  Needs walker to reduce L knee WB Comments: Discussed the need for continued AD use to manage edema    TODAY'S TREATMENT                                                                          DATE:  12/03/2021 Tailgate Lt knee flexion 3 minutes Contract-relax knee flexion stretch with PT (seated with towel roll under L distal thigh), push into extension for 5 seconds, bend back for 10 seconds, then repeat 10 reps, rests PRN between reps due to pain  Functional Activities for stairs and sit to stand: Double leg press 68# 15X slow eccentrics Single (Left) leg press 25# 10X slow eccentrics   Vasopneumatic Left knee 34* High Pressure 15 minutes   11/27/2021 Recumbent bike Seat 6 AAROM only (unable to complete a revolution) for 8 minutes Quadriceps 2 sets of 10 reps for 5 seconds (toes back, press down and tighten thighs) with PT overpressure Tailgate Lt knee flexion 1 minute Contract-relax knee flexion stretch with PT (seated with towel roll under L distal thigh), push into extension for 5 seconds, bend back for 10 seconds, then repeat 2 sets of 10 reps, rests PRN between reps due to pain   Vasopneumatic Left knee 34* High Pressure 15 minutes   11/25/21 TherEx Recumbent bike seat 6 x 8 min; partial revolutions for ROM Tailgate knee flexion x 2 min AA knee flexion sitting 10 x 10 sec hold, RLE providing overpressure Seated LAQ 2x10; 3-5 sec hold on Lt Long sitting AA heel slides x 10 reps with strap; Lt ROM measurements as noted above Sit to/from stand with min UE support  from elevated mat x 10 reps  Manual PROM sitting knee flexion to tolerance   PATIENT EDUCATION:  11/04/2021 Education details: HEP update Person educated: Patient Education method: Explanation, Demonstration, Tactile cues, Verbal cues, and Handouts Education comprehension: verbalized understanding, returned demonstration, verbal cues required, tactile cues required   HOME EXERCISE PROGRAM: Access Code: [email or web address] URL: [email or web address] Date: 11/04/2021 Prepared by: Scot Jun  Exercises - Supine Quadricep Sets  - 5 x daily - 7 x weekly -  2 sets - 10 reps - 5 second hold - Seated Knee Flexion AAROM  - 5 x daily - 7 x weekly - 1 sets - 1 reps - 3 minutes hold - Seated Long Arc Quad (Mirrored)  - 3-5 x daily - 7 x weekly - 1-2 sets - 10 reps - 2 hold - Ankle Pumps in Elevation  - 2-3 x daily - 7 x weekly - 1-2 sets - 10 reps   ASSESSMENT:   CLINICAL IMPRESSION: Bridget [name] is noticing more thigh/quadriceps stretching as her edema/fluid on her left side slowly improves.  Edema/fluid is still significant on the left, although it is better over the past 1-2 weeks.  She is scheduled for manipulation in 2 days and this combined with continued edema/fluid loss and consistent HEP compliance should allow her to meet long-term goals.    OBJECTIVE IMPAIRMENTS: Abnormal gait, decreased activity tolerance, decreased balance, decreased coordination, decreased endurance, decreased knowledge of condition, difficulty walking, decreased ROM, decreased strength, decreased safety awareness, increased edema, impaired perceived functional ability, obesity, and pain.    ACTIVITY LIMITATIONS: carrying, bending, sitting, standing, squatting, stairs, bed mobility, dressing, and locomotion level   PARTICIPATION LIMITATIONS: cleaning, driving, shopping, community activity, and yard work   PERSONAL FACTORS: OA, skin cancer, HTN, previous Rt knee scope, atrial fibrillation are also affecting  patient's functional outcome.    REHAB POTENTIAL: Good   CLINICAL DECISION MAKING: Stable/uncomplicated   EVALUATION COMPLEXITY: Low     GOALS: Goals reviewed with patient? Yes    SHORT TERM GOALS: Target date: 11/28/2021  Bridget [name] will improve Lt knee flexion AROM to 90 degrees Baseline: 40 degrees Goal status: NOT MET 12/03/21   2.  Bridget [name] will be independent and compliant 5/7 days with her starter HEP for 2 weeks Baseline:  Goal status: Met 11/13/2021     LONG TERM GOALS: Target date: 01/09/2022    Improve FOTO to 60 Baseline: 38 Goal status: INITIAL   2.  Bridget [name] will report Lt knee pain consistently 3/10 or better on the VAS Baseline: Can be 7/10 Goal status: On Going 12/03/2021   3.  Improve Lt knee AROM to 2-0-105 or better Baseline: 3-0-40 Goal status: Partially Met 12/03/2021   4.  Improve Lt knee strength as assessed by MMT, hand-held dynamometer and transition to a cane or no AD Baseline: Walker and poor (2/5 MMT) strength Goal status: On Going 12/03/2021   5.  Bridget [name] will be compliant and independent with her long-term HEP at DC Baseline: Started 10/31/2021 Goal status: On Going 12/03/2021     PLAN:   PT FREQUENCY:  2-3x/week   PT DURATION: 10 weeks   PLANNED INTERVENTIONS: Therapeutic exercises, Therapeutic activity, Neuromuscular re-education, Balance training, Gait training, Patient/Family education, Self Care, Joint mobilization, Stair training, Electrical stimulation, Cryotherapy, Vasopneumatic device, and Manual therapy   PLAN FOR NEXT SESSION: Edema/fluid control, quadriceps strength and FLEXION AROM in preparation for manipulation in 2 days.   Farley Ly PT, MPT 12/03/21 3:15 PM

## 2021-12-05 ENCOUNTER — Encounter: Payer: Medicare Other | Admitting: Physical Therapy

## 2021-12-05 ENCOUNTER — Ambulatory Visit (INDEPENDENT_AMBULATORY_CARE_PROVIDER_SITE_OTHER): Payer: Medicare Other | Admitting: Rehabilitative and Restorative Service Providers"

## 2021-12-05 DIAGNOSIS — M25662 Stiffness of left knee, not elsewhere classified: Secondary | ICD-10-CM | POA: Diagnosis not present

## 2021-12-05 DIAGNOSIS — M25562 Pain in left knee: Secondary | ICD-10-CM | POA: Diagnosis not present

## 2021-12-05 DIAGNOSIS — M24661 Ankylosis, right knee: Secondary | ICD-10-CM | POA: Diagnosis not present

## 2021-12-05 DIAGNOSIS — M6281 Muscle weakness (generalized): Secondary | ICD-10-CM

## 2021-12-05 DIAGNOSIS — R262 Difficulty in walking, not elsewhere classified: Secondary | ICD-10-CM

## 2021-12-05 DIAGNOSIS — R600 Localized edema: Secondary | ICD-10-CM | POA: Diagnosis not present

## 2021-12-05 DIAGNOSIS — G8918 Other acute postprocedural pain: Secondary | ICD-10-CM | POA: Diagnosis not present

## 2021-12-05 DIAGNOSIS — Z96652 Presence of left artificial knee joint: Secondary | ICD-10-CM | POA: Diagnosis not present

## 2021-12-05 DIAGNOSIS — T8482XA Fibrosis due to internal orthopedic prosthetic devices, implants and grafts, initial encounter: Secondary | ICD-10-CM | POA: Diagnosis not present

## 2021-12-05 NOTE — Therapy (Signed)
OUTPATIENT PHYSICAL THERAPY TREATMENT  Patient Name: Bridget [name] MRN: [id number] DOB:1945-10-02, 76 y.o., female Today's Date: 12/05/2021   END OF SESSION:   PT End of Session - 12/05/21 1552     Visit Number 13    Number of Visits 15    Progress Note Due on Visit 18    PT Start Time 5701    PT Stop Time 1602    PT Time Calculation (min) 49 min    Activity Tolerance Patient tolerated treatment well    Behavior During Therapy WFL for tasks assessed/performed               Past Medical History:  Diagnosis Date   Arthritis    Cancer ([place])    basal cell cancer removed on face   Hypertension    Seasonal allergies    Sleep apnea    Past Surgical History:  Procedure Laterality Date   BREAST EXCISIONAL BIOPSY Left    CHOLECYSTECTOMY  02/2010   KNEE SURGERY Right    Meniscal tear   OOPHORECTOMY  01/2003   RSO AT [facility]   TOTAL KNEE ARTHROPLASTY Left 10/18/2021   Procedure: LEFT TOTAL KNEE ARTHROPLASTY;  Surgeon: Mcarthur Rossetti, MD;  Location: [facility];  Service: Orthopedics;  Laterality: Left;   VAGINAL HYSTERECTOMY  01/2003   TVH,RSO A&P REPAIR   WISDOM TOOTH EXTRACTION     Patient Active Problem List   Diagnosis Date Noted   Status post total left knee replacement 10/18/2021   Degenerative arthritis of left knee 11/24/2019   Secondary hypercoagulable state ([place]) 01/26/2019   Greater trochanteric bursitis of right hip 09/23/2018   Lumbar radiculopathy 08/12/2018   Gluteal tendonitis of right buttock 07/29/2018   Atrial fibrillation ([place]) 07/21/2018   Arthritis of left acromioclavicular joint 04/26/2018   Partial nontraumatic tear of left rotator cuff 04/26/2018   Hypertension    Seasonal allergies      THERAPY DIAG:  Difficulty in walking, not elsewhere classified  Muscle weakness (generalized)  Localized edema  Stiffness of left knee, not elsewhere classified  Left knee pain, unspecified chronicity  PCP: Jonathon Jordan, MD   REFERRING  PROVIDER: Mcarthur Rossetti, MD   REFERRING DIAG: [phone] (ICD-10-CM) - Unilateral primary osteoarthritis, left knee    EVAL THERAPY DIAG:  Difficulty in walking, not elsewhere classified   Localized edema   Muscle weakness (generalized)   Stiffness of left knee, not elsewhere classified   Left knee pain, unspecified chronicity   Rationale for Evaluation and Treatment Rehabilitation   ONSET DATE: Surgery 10/18/2021   SUBJECTIVE:    SUBJECTIVE STATEMENT: She indicated having 2/10 today with some soreness.  She reported still feeling numbness as well from procedure.   PERTINENT HISTORY: OA, skin cancer, HTN, previous Rt knee scope, atrial fibrillation  PAIN:  NPRS scale: 2/10 Pain location: Lt knee Pain description: tightness, throbbing, aching  Aggravating factors: standing, colder weather  Relieving factors: ice, elevation    PRECAUTIONS: None   WEIGHT BEARING RESTRICTIONS: No   FALLS:  Has patient fallen in last 6 months? No   LIVING ENVIRONMENT: Lives with: lives with their spouse Lives in: House/apartment Stairs: 1 stair Has following equipment at home: Environmental consultant - 2 wheeled   OCCUPATION: Retired   PLOF: Independent   PATIENT GOALS: Return to gardening, visiting grand kids     OBJECTIVE:    DIAGNOSTIC FINDINGS:  10/31/2021 review of FINDINGS: Changes of left knee replacement. No hardware or bony complicating feature. Soft tissue  and joint space gas noted.   PATIENT SURVEYS:  11/20/2021 FOTO 31 (Goal 60)  10/31/2021 FOTO 38 (Goal 60 in 15 visits)   COGNITION: 10/31/2021 Overall cognitive status: Within functional limits for tasks assessed                         SENSATION: 10/31/2021 No complaints of peripheral pain or paresthesias   EDEMA:  12/05/2021:   10/31/2021 Noted but not objectively assessed     LOWER EXTREMITY ROM:   Active ROM Right 10/31/2021 Left 10/31/2021 Left 11/01/2021 Left 11/13/2021 Left 11/20/2021  Left 11/25/21 Left 11/27/2021 Left 12/05/2021  Knee flexion 112 40 50 58 62 62 73 PROM 65 AROM in supine heel slide (68 PROM)  Knee extension 0 -3 -3 -3 -2 -3 (LAQ) -2 PROM  -12 AROM in seated LAQ  BOLD MEASUREMENTS POST MANIP  (Blank rows = not tested)   LOWER EXTREMITY MMT:   MMT Right 10/31/2021 Left 10/31/2021 Left 12/05/2021  Knee flexion       Knee extension 4/5 2/5 3+/5  BOLD MEASUREMENTS POST MANIP   (Blank rows = not tested)   GAIT: 12/05/2021 : FWW use mod independence  10/31/2021 Distance walked: 50 feet Assistive device utilized: Environmental consultant - 2 wheeled Level of assistance:  Needs walker to reduce L knee WB Comments: Discussed the need for continued AD use to manage edema    TODAY'S TREATMENT                                                                          DATE:  12/05/2021 Therex: Nustep Lvl 5 10 mins UE/LE  Seated Lt knee AROM c pause in end ranges x 15 c contralateral leg movement opposite, x 15 after manual Supine LAQ in elevation for swelling control x 10 (cues for home use)  Manual: Seated Lt knee flexion c distraction/IR mobilization c movement c contract/relax holds into flexion.   Vasopneumatic Lt knee in elevation 34 deg high compression  12/03/2021 Tailgate Lt knee flexion 3 minutes Contract-relax knee flexion stretch with PT (seated with towel roll under L distal thigh), push into extension for 5 seconds, bend back for 10 seconds, then repeat 10 reps, rests PRN between reps due to pain  Functional Activities for stairs and sit to stand: Double leg press 68# 15X slow eccentrics Single (Left) leg press 25# 10X slow eccentrics   Vasopneumatic Left knee 34* High Pressure 15 minutes   11/27/2021 Recumbent bike Seat 6 AAROM only (unable to complete a revolution) for 8 minutes Quadriceps 2 sets of 10 reps for 5 seconds (toes back, press down and tighten thighs) with PT overpressure Tailgate Lt knee flexion 1 minute Contract-relax knee  flexion stretch with PT (seated with towel roll under L distal thigh), push into extension for 5 seconds, bend back for 10 seconds, then repeat 2 sets of 10 reps, rests PRN between reps due to pain   Vasopneumatic Left knee 34* High Pressure 15 minutes   11/25/21 TherEx Recumbent bike seat 6 x 8 min; partial revolutions for ROM Tailgate knee flexion x 2 min AA knee flexion sitting 10 x 10 sec hold, RLE providing overpressure Seated LAQ 2x10; 3-5 sec  hold on Lt Long sitting AA heel slides x 10 reps with strap; Lt ROM measurements as noted above Sit to/from stand with min UE support from elevated mat x 10 reps  Manual PROM sitting knee flexion to tolerance   PATIENT EDUCATION:  11/04/2021 Education details: HEP update Person educated: Patient Education method: Explanation, Demonstration, Tactile cues, Verbal cues, and Handouts Education comprehension: verbalized understanding, returned demonstration, verbal cues required, tactile cues required   HOME EXERCISE PROGRAM: Access Code: [email or web address] URL: [email or web address] Date: 11/04/2021 Prepared by: Scot Jun  Exercises - Supine Quadricep Sets  - 5 x daily - 7 x weekly - 2 sets - 10 reps - 5 second hold - Seated Knee Flexion AAROM  - 5 x daily - 7 x weekly - 1 sets - 1 reps - 3 minutes hold - Seated Long Arc Quad (Mirrored)  - 3-5 x daily - 7 x weekly - 1-2 sets - 10 reps - 2 hold - Ankle Pumps in Elevation  - 2-3 x daily - 7 x weekly - 1-2 sets - 10 reps   ASSESSMENT:   CLINICAL IMPRESSION: Pt returned today post manipulation with complaints of Lt knee pain c mobility, strength and movement coordination deficits.  Continued skilled PT services indicated at this time to address restrictions.    Ambulated into clinic c FWW today.  Flexion mobility actively improved post manual today from 65 to 72 .     OBJECTIVE IMPAIRMENTS: Abnormal gait, decreased activity tolerance, decreased balance, decreased  coordination, decreased endurance, decreased knowledge of condition, difficulty walking, decreased ROM, decreased strength, decreased safety awareness, increased edema, impaired perceived functional ability, obesity, and pain.    ACTIVITY LIMITATIONS: carrying, bending, sitting, standing, squatting, stairs, bed mobility, dressing, and locomotion level   PARTICIPATION LIMITATIONS: cleaning, driving, shopping, community activity, and yard work   PERSONAL FACTORS: OA, skin cancer, HTN, previous Rt knee scope, atrial fibrillation are also affecting patient's functional outcome.    REHAB POTENTIAL: Good   CLINICAL DECISION MAKING: Stable/uncomplicated   EVALUATION COMPLEXITY: Low     GOALS: Goals reviewed with patient? Yes    SHORT TERM GOALS: Target date: 11/28/2021  Kayley will improve Lt knee flexion AROM to 90 degrees Baseline: 40 degrees Goal status: NOT MET 12/03/21   2.  Gianah will be independent and compliant 5/7 days with her starter HEP for 2 weeks Baseline:  Goal status: Met 11/13/2021     LONG TERM GOALS: Target date: 01/09/2022    Improve FOTO to 60 Baseline: 38 Goal status: INITIAL   2.  Adlai will report Lt knee pain consistently 3/10 or better on the VAS Baseline: Can be 7/10 Goal status: On Going 12/03/2021   3.  Improve Lt knee AROM to 2-0-105 or better Baseline: 3-0-40 Goal status: Partially Met 12/03/2021   4.  Improve Lt knee strength as assessed by MMT, hand-held dynamometer and transition to a cane or no AD Baseline: Walker and poor (2/5 MMT) strength Goal status: On Going 12/03/2021   5.  Tamirah will be compliant and independent with her long-term HEP at DC Baseline: Started 10/31/2021 Goal status: On Going 12/03/2021     PLAN:   PT FREQUENCY:  2-3x/week   PT DURATION: 10 weeks   PLANNED INTERVENTIONS: Therapeutic exercises, Therapeutic activity, Neuromuscular re-education, Balance training, Gait training, Patient/Family education, Self Care,  Joint mobilization, Stair training, Electrical stimulation, Cryotherapy, Vasopneumatic device, and Manual therapy   PLAN FOR NEXT SESSION: Extended time spent in manual for flexion mobility gains.  Scot Jun, PT, DPT, OCS, ATC 12/05/21  3:53 PM

## 2021-12-06 ENCOUNTER — Ambulatory Visit (INDEPENDENT_AMBULATORY_CARE_PROVIDER_SITE_OTHER): Payer: Medicare Other | Admitting: Physical Therapy

## 2021-12-06 ENCOUNTER — Encounter: Payer: Self-pay | Admitting: Physical Therapy

## 2021-12-06 DIAGNOSIS — M25562 Pain in left knee: Secondary | ICD-10-CM | POA: Diagnosis not present

## 2021-12-06 DIAGNOSIS — M6281 Muscle weakness (generalized): Secondary | ICD-10-CM

## 2021-12-06 DIAGNOSIS — M25662 Stiffness of left knee, not elsewhere classified: Secondary | ICD-10-CM | POA: Diagnosis not present

## 2021-12-06 DIAGNOSIS — R600 Localized edema: Secondary | ICD-10-CM

## 2021-12-06 DIAGNOSIS — R262 Difficulty in walking, not elsewhere classified: Secondary | ICD-10-CM | POA: Diagnosis not present

## 2021-12-06 NOTE — Therapy (Unsigned)
OUTPATIENT PHYSICAL THERAPY TREATMENT  Patient Name: Bridget [name] MRN: [id number] DOB:03-01-1945, 76 y.o., female Today's Date: 12/09/2021   END OF SESSION:    12/09/21 1339  PT Visits / Re-Eval  Visit Number 15  Number of Visits 36  Date for PT Re-Evaluation 01/20/22  Authorization  Progress Note Due on Visit 18  PT Time Calculation  PT Start Time 1300  PT Stop Time 1345  PT Time Calculation (min) 45 min  PT - End of Session  Equipment Utilized During Treatment Gait belt  Activity Tolerance Patient tolerated treatment well  Behavior During Therapy WFL for tasks assessed/performed        Past Medical History:  Diagnosis Date   Arthritis    Cancer ([place])    basal cell cancer removed on face   Hypertension    Seasonal allergies    Sleep apnea    Past Surgical History:  Procedure Laterality Date   BREAST EXCISIONAL BIOPSY Left    CHOLECYSTECTOMY  02/2010   KNEE SURGERY Right    Meniscal tear   OOPHORECTOMY  01/2003   RSO AT [facility]   TOTAL KNEE ARTHROPLASTY Left 10/18/2021   Procedure: LEFT TOTAL KNEE ARTHROPLASTY;  Surgeon: Mcarthur Rossetti, MD;  Location: [facility];  Service: Orthopedics;  Laterality: Left;   VAGINAL HYSTERECTOMY  01/2003   TVH,RSO A&P REPAIR   WISDOM TOOTH EXTRACTION     Patient Active Problem List   Diagnosis Date Noted   Status post total left knee replacement 10/18/2021   Degenerative arthritis of left knee 11/24/2019   Secondary hypercoagulable state ([place]) 01/26/2019   Greater trochanteric bursitis of right hip 09/23/2018   Lumbar radiculopathy 08/12/2018   Gluteal tendonitis of right buttock 07/29/2018   Atrial fibrillation ([place]) 07/21/2018   Arthritis of left acromioclavicular joint 04/26/2018   Partial nontraumatic tear of left rotator cuff 04/26/2018   Hypertension    Seasonal allergies      THERAPY DIAG:  Difficulty in walking, not elsewhere classified  Muscle weakness (generalized)  Localized edema  Stiffness  of left knee, not elsewhere classified  Left knee pain, unspecified chronicity  PCP: Jonathon Jordan, MD   REFERRING PROVIDER: Mcarthur Rossetti, MD   REFERRING DIAG: [phone] (ICD-10-CM) - Unilateral primary osteoarthritis, left knee    EVAL THERAPY DIAG:  Difficulty in walking, not elsewhere classified   Localized edema   Muscle weakness (generalized)   Stiffness of left knee, not elsewhere classified   Left knee pain, unspecified chronicity   Rationale for Evaluation and Treatment Rehabilitation   ONSET DATE: Surgery 10/18/2021   SUBJECTIVE:    SUBJECTIVE STATEMENT: Pt states that she is feeling discouraged and has some tenderness in her quad today.   PERTINENT HISTORY: OA, skin cancer, HTN, previous Rt knee scope, atrial fibrillation  PAIN:  NPRS scale: 2/10 Pain location: Lt knee Pain description: tightness, throbbing, aching  Aggravating factors: standing, colder weather  Relieving factors: ice, elevation    PRECAUTIONS: None   WEIGHT BEARING RESTRICTIONS: No   FALLS:  Has patient fallen in last 6 months? No   LIVING ENVIRONMENT: Lives with: lives with their spouse Lives in: House/apartment Stairs: 1 stair Has following equipment at home: Environmental consultant - 2 wheeled   OCCUPATION: Retired   PLOF: Independent   PATIENT GOALS: Return to gardening, visiting grand kids     OBJECTIVE:    DIAGNOSTIC FINDINGS:  10/31/2021 review of FINDINGS: Changes of left knee replacement. No hardware or bony complicating feature. Soft tissue and  joint space gas noted.   PATIENT SURVEYS:  11/20/2021 FOTO 31 (Goal 60)  10/31/2021 FOTO 38 (Goal 60 in 15 visits)   COGNITION: 10/31/2021 Overall cognitive status: Within functional limits for tasks assessed                         SENSATION: 10/31/2021 No complaints of peripheral pain or paresthesias   EDEMA:  12/05/2021:   10/31/2021 Noted but not objectively assessed     LOWER EXTREMITY ROM:   Active ROM  Right 10/31/2021 Left 10/31/2021 Left 11/01/2021 Left 11/13/2021 Left 11/20/2021 Left 11/25/21 Left 11/27/2021 Left 12/05/2021  Knee flexion 112 40 50 58 62 62 73 PROM 65 AROM in supine heel slide (68 PROM)  Knee extension 0 -3 -3 -3 -2 -3 (LAQ) -2 PROM  -12 AROM in seated LAQ  BOLD MEASUREMENTS POST MANIP  (Blank rows = not tested)   LOWER EXTREMITY MMT:   MMT Right 10/31/2021 Left 10/31/2021 Left 12/05/2021  Knee flexion       Knee extension 4/5 2/5 3+/5  BOLD MEASUREMENTS POST MANIP   (Blank rows = not tested)   GAIT: 12/05/2021 : FWW use mod independence  10/31/2021 Distance walked: 50 feet Assistive device utilized: Environmental consultant - 2 wheeled Level of assistance:  Needs walker to reduce L knee WB Comments: Discussed the need for continued AD use to manage edema    TODAY'S TREATMENT                                                                          DATE:  12/09/2021 Therex: Nustep Lvl 5 5 mins UE/LE  Seated Lt knee AROM c pause in end ranges x 10 c contralateral leg   LAQ with leg weight focusing on quad contraction and eccentric control 2x10  Gait with stand alone cane 114f with CGA.   Manual: Seated Lt knee flexion c distraction/IR mobilization while pt performs theragun to thigh. Improvements noted with continued duration.  STM to quad.   Vasopneumatic Lt knee in elevation 34 deg high compression  12/05/2021 Therex: Nustep Lvl 5 10 mins UE/LE  Seated Lt knee AROM c pause in end ranges x 15 c contralateral leg movement opposite, x 15 after manual Supine LAQ in elevation for swelling control x 10 (cues for home use)  Manual: Seated Lt knee flexion c distraction/IR mobilization c movement c contract/relax holds into flexion.   Vasopneumatic Lt knee in elevation 34 deg high compression  12/03/2021 Tailgate Lt knee flexion 3 minutes Contract-relax knee flexion stretch with PT (seated with towel roll under L distal thigh), push into extension for 5 seconds,  bend back for 10 seconds, then repeat 10 reps, rests PRN between reps due to pain  Functional Activities for stairs and sit to stand: Double leg press 68# 15X slow eccentrics Single (Left) leg press 25# 10X slow eccentrics   Vasopneumatic Left knee 34* High Pressure 15 minutes   11/27/2021 Recumbent bike Seat 6 AAROM only (unable to complete a revolution) for 8 minutes Quadriceps 2 sets of 10 reps for 5 seconds (toes back, press down and tighten thighs) with PT overpressure Tailgate Lt knee flexion 1 minute Contract-relax knee flexion stretch with  PT (seated with towel roll under L distal thigh), push into extension for 5 seconds, bend back for 10 seconds, then repeat 2 sets of 10 reps, rests PRN between reps due to pain   Vasopneumatic Left knee 34* High Pressure 15 minutes   11/25/21 TherEx Recumbent bike seat 6 x 8 min; partial revolutions for ROM Tailgate knee flexion x 2 min AA knee flexion sitting 10 x 10 sec hold, RLE providing overpressure Seated LAQ 2x10; 3-5 sec hold on Lt Long sitting AA heel slides x 10 reps with strap; Lt ROM measurements as noted above Sit to/from stand with min UE support from elevated mat x 10 reps  Manual PROM sitting knee flexion to tolerance   PATIENT EDUCATION:  11/04/2021 Education details: HEP update Person educated: Patient Education method: Explanation, Demonstration, Tactile cues, Verbal cues, and Handouts Education comprehension: verbalized understanding, returned demonstration, verbal cues required, tactile cues required   HOME EXERCISE PROGRAM: Access Code: [email or web address] URL: [email or web address] Date: 11/04/2021 Prepared by: Scot Jun  Exercises - Supine Quadricep Sets  - 5 x daily - 7 x weekly - 2 sets - 10 reps - 5 second hold - Seated Knee Flexion AAROM  - 5 x daily - 7 x weekly - 1 sets - 1 reps - 3 minutes hold - Seated Long Arc Quad (Mirrored)  - 3-5 x daily - 7 x weekly - 1-2 sets - 10 reps - 2 hold -  Ankle Pumps in Elevation  - 2-3 x daily - 7 x weekly - 1-2 sets - 10 reps   ASSESSMENT:   CLINICAL IMPRESSION: Pt returns to PT with reports of compliance with her HEP. Session with continued focus on mobility to tolerance. Pt able to tolerate increased flexion today with use of a theragun while PT performs OP into flexion. Pt states that she feels looser. Due to pt ambulating with standalone cane at home. Practiced in clinic with good forum and balance noted. Pt was able to negotiate a step with no issues. Encouraged pt to start performing SLR at home due to pt demonstrating ability to do so when setting up vaso compression today.     OBJECTIVE IMPAIRMENTS: Abnormal gait, decreased activity tolerance, decreased balance, decreased coordination, decreased endurance, decreased knowledge of condition, difficulty walking, decreased ROM, decreased strength, decreased safety awareness, increased edema, impaired perceived functional ability, obesity, and pain.    ACTIVITY LIMITATIONS: carrying, bending, sitting, standing, squatting, stairs, bed mobility, dressing, and locomotion level   PARTICIPATION LIMITATIONS: cleaning, driving, shopping, community activity, and yard work   PERSONAL FACTORS: OA, skin cancer, HTN, previous Rt knee scope, atrial fibrillation are also affecting patient's functional outcome.    REHAB POTENTIAL: Good   CLINICAL DECISION MAKING: Stable/uncomplicated   EVALUATION COMPLEXITY: Low     GOALS: Goals reviewed with patient? Yes    SHORT TERM GOALS: Target date: 11/28/2021  Jameelah will improve Lt knee flexion AROM to 90 degrees Baseline: 40 degrees Goal status: NOT MET 12/03/21   2.  Yanina will be independent and compliant 5/7 days with her starter HEP for 2 weeks Baseline:  Goal status: Met 11/13/2021     LONG TERM GOALS: Target date: 01/20/2022    Improve FOTO to 60 Baseline: 38 Goal status: INITIAL   2.  Ralynn will report Lt knee pain consistently 3/10 or  better on the VAS Baseline: Can be 7/10 Goal status: On Going 12/03/2021   3.  Improve Lt knee AROM to 2-0-105 or better Baseline: 3-0-40 Goal status:  Partially Met 12/03/2021   4.  Improve Lt knee strength as assessed by MMT, hand-held dynamometer and transition to a cane or no AD Baseline: Walker and poor (2/5 MMT) strength Goal status: On Going 12/03/2021   5.  Abbigayle will be compliant and independent with her long-term HEP at DC Baseline: Started 10/31/2021 Goal status: On Going 12/03/2021     PLAN:   PT FREQUENCY:  5x per week for 2 weeks, 2x per week for 4 weeks.    PT DURATION: 6 weeks   PLANNED INTERVENTIONS: Therapeutic exercises, Therapeutic activity, Neuromuscular re-education, Balance training, Gait training, Patient/Family education, Self Care, Joint mobilization, Stair training, Electrical stimulation, Cryotherapy, Vasopneumatic device, and Manual therapy   PLAN FOR NEXT SESSION: Extended time spent in manual for flexion mobility gains.     Rudi Heap PT, DPT 12/09/21  1:41 PM

## 2021-12-06 NOTE — Therapy (Signed)
OUTPATIENT PHYSICAL THERAPY TREATMENT  Patient Name: Bridget [name] MRN: [id number] DOB:02-07-1945, 76 y.o., female Today's Date: 12/06/2021   END OF SESSION:   PT End of Session - 12/06/21 1357     Visit Number 14    Progress Note Due on Visit 18    PT Start Time 6381    PT Stop Time 1427    PT Time Calculation (min) 39 min    Activity Tolerance Patient tolerated treatment well    Behavior During Therapy WFL for tasks assessed/performed                Past Medical History:  Diagnosis Date   Arthritis    Cancer ([place])    basal cell cancer removed on face   Hypertension    Seasonal allergies    Sleep apnea    Past Surgical History:  Procedure Laterality Date   BREAST EXCISIONAL BIOPSY Left    CHOLECYSTECTOMY  02/2010   KNEE SURGERY Right    Meniscal tear   OOPHORECTOMY  01/2003   RSO AT [facility]   TOTAL KNEE ARTHROPLASTY Left 10/18/2021   Procedure: LEFT TOTAL KNEE ARTHROPLASTY;  Surgeon: Mcarthur Rossetti, MD;  Location: [facility];  Service: Orthopedics;  Laterality: Left;   VAGINAL HYSTERECTOMY  01/2003   [facility]O A&P REPAIR   WISDOM TOOTH EXTRACTION     Patient Active Problem List   Diagnosis Date Noted   Status post total left knee replacement 10/18/2021   Degenerative arthritis of left knee 11/24/2019   Secondary hypercoagulable state ([place]) 01/26/2019   Greater trochanteric bursitis of right hip 09/23/2018   Lumbar radiculopathy 08/12/2018   Gluteal tendonitis of right buttock 07/29/2018   Atrial fibrillation ([place]) 07/21/2018   Arthritis of left acromioclavicular joint 04/26/2018   Partial nontraumatic tear of left rotator cuff 04/26/2018   Hypertension    Seasonal allergies      THERAPY DIAG:  Difficulty in walking, not elsewhere classified  Muscle weakness (generalized)  Localized edema  Stiffness of left knee, not elsewhere classified  Left knee pain, unspecified chronicity  PCP: Jonathon Jordan, MD   REFERRING PROVIDER: Mcarthur Rossetti, MD   REFERRING DIAG: [phone] (ICD-10-CM) - Unilateral primary osteoarthritis, left knee    EVAL THERAPY DIAG:  Difficulty in walking, not elsewhere classified   Localized edema   Muscle weakness (generalized)   Stiffness of left knee, not elsewhere classified   Left knee pain, unspecified chronicity   Rationale for Evaluation and Treatment Rehabilitation   ONSET DATE: Surgery 10/18/2021   SUBJECTIVE:    SUBJECTIVE STATEMENT:  Not feeling too bad today, just a little sore at the top of that incision. Did some exercises yesterday except for that one little place at the top of my scar. Still not back on my Lasix after the manipulation.   PERTINENT HISTORY: OA, skin cancer, HTN, previous Rt knee scope, atrial fibrillation  PAIN:  NPRS scale: 2/10 Pain location: Lt knee Pain description: nagging   Aggravating factors: standing, colder weather  Relieving factors: ice, elevation    PRECAUTIONS: None   WEIGHT BEARING RESTRICTIONS: No   FALLS:  Has patient fallen in last 6 months? No   LIVING ENVIRONMENT: Lives with: lives with their spouse Lives in: House/apartment Stairs: 1 stair Has following equipment at home: Environmental consultant - 2 wheeled   OCCUPATION: Retired   PLOF: Independent   PATIENT GOALS: Return to gardening, visiting grand kids     OBJECTIVE:    DIAGNOSTIC FINDINGS:  10/31/2021  review of FINDINGS: Changes of left knee replacement. No hardware or bony complicating feature. Soft tissue and joint space gas noted.   PATIENT SURVEYS:  11/20/2021 FOTO 31 (Goal 60)  10/31/2021 FOTO 38 (Goal 60 in 15 visits)   COGNITION: 10/31/2021 Overall cognitive status: Within functional limits for tasks assessed                         SENSATION: 10/31/2021 No complaints of peripheral pain or paresthesias   EDEMA:  12/05/2021:   10/31/2021 Noted but not objectively assessed     LOWER EXTREMITY ROM:   Active ROM Right 10/31/2021 Left 10/31/2021  Left 11/01/2021 Left 11/13/2021 Left 11/20/2021 Left 11/25/21 Left 11/27/2021 Left 12/05/2021 Left 12/06/21   Knee flexion 112 40 50 58 62 62 73 PROM 65 AROM in supine heel slide (68 PROM) 80 PROM sitting edge of mat table, 76 AROM  Knee extension 0 -3 -3 -3 -2 -3 (LAQ) -2 PROM  -12 AROM in seated LAQ   BOLD MEASUREMENTS POST MANIP  (Blank rows = not tested)   LOWER EXTREMITY MMT:   MMT Right 10/31/2021 Left 10/31/2021 Left 12/05/2021  Knee flexion       Knee extension 4/5 2/5 3+/5  BOLD MEASUREMENTS POST MANIP   (Blank rows = not tested)   GAIT: 12/05/2021 : FWW use mod independence  10/31/2021 Distance walked: 50 feet Assistive device utilized: Environmental consultant - 2 wheeled Level of assistance:  Needs walker to reduce L knee WB Comments: Discussed the need for continued AD use to manage edema    TODAY'S TREATMENT                                                                          DATE:   12/06/21  TherEx  Scifit bike seat 10 partial rotations x6 minutes SAQs 3# 1x15 with 3 second holds LLE  SLRS 0# x10 + quad set L LE  Seated HS curls 0# edge of mat table 3 second holds x15 Seated LAQs 3# x15 3 second holds Self assisted AAROM L knee flexion x10 3 second holds    Manual  Patella mobs all directions grade III  Tennis ball massage to R quad supine  Knee flexion OP seated edge of mat table    12/05/2021 Therex: Nustep Lvl 5 10 mins UE/LE  Seated Lt knee AROM c pause in end ranges x 15 c contralateral leg movement opposite, x 15 after manual Supine LAQ in elevation for swelling control x 10 (cues for home use)  Manual: Seated Lt knee flexion c distraction/IR mobilization c movement c contract/relax holds into flexion.   Vasopneumatic Lt knee in elevation 34 deg high compression  12/03/2021 Tailgate Lt knee flexion 3 minutes Contract-relax knee flexion stretch with PT (seated with towel roll under L distal thigh), push into extension for 5 seconds, bend back for  10 seconds, then repeat 10 reps, rests PRN between reps due to pain  Functional Activities for stairs and sit to stand: Double leg press 68# 15X slow eccentrics Single (Left) leg press 25# 10X slow eccentrics   Vasopneumatic Left knee 34* High Pressure 15 minutes   11/27/2021 Recumbent bike Seat 6 AAROM only (  unable to complete a revolution) for 8 minutes Quadriceps 2 sets of 10 reps for 5 seconds (toes back, press down and tighten thighs) with PT overpressure Tailgate Lt knee flexion 1 minute Contract-relax knee flexion stretch with PT (seated with towel roll under L distal thigh), push into extension for 5 seconds, bend back for 10 seconds, then repeat 2 sets of 10 reps, rests PRN between reps due to pain   Vasopneumatic Left knee 34* High Pressure 15 minutes   11/25/21 TherEx Recumbent bike seat 6 x 8 min; partial revolutions for ROM Tailgate knee flexion x 2 min AA knee flexion sitting 10 x 10 sec hold, RLE providing overpressure Seated LAQ 2x10; 3-5 sec hold on Lt Long sitting AA heel slides x 10 reps with strap; Lt ROM measurements as noted above Sit to/from stand with min UE support from elevated mat x 10 reps  Manual PROM sitting knee flexion to tolerance   PATIENT EDUCATION:  11/04/2021 Education details: HEP update Person educated: Patient Education method: Explanation, Demonstration, Tactile cues, Verbal cues, and Handouts Education comprehension: verbalized understanding, returned demonstration, verbal cues required, tactile cues required   HOME EXERCISE PROGRAM: Access Code: [email or web address] URL: [email or web address] Date: 11/04/2021 Prepared by: Scot Jun  Exercises - Supine Quadricep Sets  - 5 x daily - 7 x weekly - 2 sets - 10 reps - 5 second hold - Seated Knee Flexion AAROM  - 5 x daily - 7 x weekly - 1 sets - 1 reps - 3 minutes hold - Seated Long Arc Quad (Mirrored)  - 3-5 x daily - 7 x weekly - 1-2 sets - 10 reps - 2 hold - Ankle Pumps  in Elevation  - 2-3 x daily - 7 x weekly - 1-2 sets - 10 reps   ASSESSMENT:   CLINICAL IMPRESSION:  Bridget [name] arrives today doing OK, felt pretty good after PT session yesterday. We continued focusing on flexion ROM as able and tolerated, hopefully ROM will improve even more when she is able to restart her Lasix. Responded well to manual interventions this session. Will continue to progress.     OBJECTIVE IMPAIRMENTS: Abnormal gait, decreased activity tolerance, decreased balance, decreased coordination, decreased endurance, decreased knowledge of condition, difficulty walking, decreased ROM, decreased strength, decreased safety awareness, increased edema, impaired perceived functional ability, obesity, and pain.    ACTIVITY LIMITATIONS: carrying, bending, sitting, standing, squatting, stairs, bed mobility, dressing, and locomotion level   PARTICIPATION LIMITATIONS: cleaning, driving, shopping, community activity, and yard work   PERSONAL FACTORS: OA, skin cancer, HTN, previous Rt knee scope, atrial fibrillation are also affecting patient's functional outcome.    REHAB POTENTIAL: Good   CLINICAL DECISION MAKING: Stable/uncomplicated   EVALUATION COMPLEXITY: Low     GOALS: Goals reviewed with patient? Yes    SHORT TERM GOALS: Target date: 11/28/2021  Celestina will improve Lt knee flexion AROM to 90 degrees Baseline: 40 degrees Goal status: NOT MET 12/03/21   2.  Dayanara will be independent and compliant 5/7 days with her starter HEP for 2 weeks Baseline:  Goal status: Met 11/13/2021     LONG TERM GOALS: Target date: 01/09/2022    Improve FOTO to 60 Baseline: 38 Goal status: INITIAL   2.  Leina will report Lt knee pain consistently 3/10 or better on the VAS Baseline: Can be 7/10 Goal status: On Going 12/03/2021   3.  Improve Lt knee AROM to 2-0-105 or better Baseline: 3-0-40 Goal status: Partially Met 12/03/2021   4.  Improve  Lt knee strength as assessed by MMT, hand-held  dynamometer and transition to a cane or no AD Baseline: Walker and poor (2/5 MMT) strength Goal status: On Going 12/03/2021   5.  Analeia will be compliant and independent with her long-term HEP at DC Baseline: Started 10/31/2021 Goal status: On Going 12/03/2021     PLAN:   PT FREQUENCY:  2-3x/week   PT DURATION: 10 weeks   PLANNED INTERVENTIONS: Therapeutic exercises, Therapeutic activity, Neuromuscular re-education, Balance training, Gait training, Patient/Family education, Self Care, Joint mobilization, Stair training, Electrical stimulation, Cryotherapy, Vasopneumatic device, and Manual therapy   PLAN FOR NEXT SESSION: Extended time spent in manual for flexion mobility gains.     Clela Hagadorn U PT DPT PN2  12/06/2021, 2:30 PM

## 2021-12-09 ENCOUNTER — Ambulatory Visit (INDEPENDENT_AMBULATORY_CARE_PROVIDER_SITE_OTHER): Payer: Medicare Other | Admitting: Physical Therapy

## 2021-12-09 ENCOUNTER — Encounter: Payer: Self-pay | Admitting: Physical Therapy

## 2021-12-09 ENCOUNTER — Other Ambulatory Visit: Payer: Self-pay | Admitting: Orthopaedic Surgery

## 2021-12-09 ENCOUNTER — Telehealth: Payer: Self-pay | Admitting: *Deleted

## 2021-12-09 DIAGNOSIS — M6281 Muscle weakness (generalized): Secondary | ICD-10-CM | POA: Diagnosis not present

## 2021-12-09 DIAGNOSIS — R600 Localized edema: Secondary | ICD-10-CM | POA: Diagnosis not present

## 2021-12-09 DIAGNOSIS — M25662 Stiffness of left knee, not elsewhere classified: Secondary | ICD-10-CM | POA: Diagnosis not present

## 2021-12-09 DIAGNOSIS — M25562 Pain in left knee: Secondary | ICD-10-CM | POA: Diagnosis not present

## 2021-12-09 DIAGNOSIS — R262 Difficulty in walking, not elsewhere classified: Secondary | ICD-10-CM | POA: Diagnosis not present

## 2021-12-09 MED ORDER — TRAMADOL HCL 50 MG PO TABS: 50.0000 mg | ORAL_TABLET | Freq: Four times a day (QID) | ORAL | 0 refills | Status: DC | PRN

## 2021-12-09 NOTE — Telephone Encounter (Signed)
Saw patient in therapy today after manipulation Thursday. Doing well. Would like to see if she can have a refill of Tramadol to take on days when she isn't hurting as bad. Thank you.

## 2021-12-09 NOTE — Therapy (Unsigned)
OUTPATIENT PHYSICAL THERAPY TREATMENT  Patient Name: Bridget [name] MRN: [id number] DOB:09/05/1945, 76 y.o., female Today's Date: 12/09/2021   END OF SESSION:        Past Medical History:  Diagnosis Date   Arthritis    Cancer ([facility])    basal cell cancer removed on face   Hypertension    Seasonal allergies    Sleep apnea    Past Surgical History:  Procedure Laterality Date   BREAST EXCISIONAL BIOPSY Left    CHOLECYSTECTOMY  02/2010   KNEE SURGERY Right    Meniscal tear   OOPHORECTOMY  01/2003   RSO AT [facility]   TOTAL KNEE ARTHROPLASTY Left 10/18/2021   Procedure: LEFT TOTAL KNEE ARTHROPLASTY;  Surgeon: Blackman, Christopher Y, MD;  Location: [facility];  Service: Orthopedics;  Laterality: Left;   VAGINAL HYSTERECTOMY  01/2003   [facility] A&P REPAIR   WISDOM TOOTH EXTRACTION     Patient Active Problem List   Diagnosis Date Noted   Status post total left knee replacement 10/18/2021   Degenerative arthritis of left knee 11/24/2019   Secondary hypercoagulable state ([facility]) 01/26/2019   Greater trochanteric bursitis of right hip 09/23/2018   Lumbar radiculopathy 08/12/2018   Gluteal tendonitis of right buttock 07/29/2018   Atrial fibrillation ([facility]) 07/21/2018   Arthritis of left acromioclavicular joint 04/26/2018   Partial nontraumatic tear of left rotator cuff 04/26/2018   Hypertension    Seasonal allergies      THERAPY DIAG:  No diagnosis found.  PCP: Sharon Wolters, MD   REFERRING PROVIDER: Christopher Y Blackman, MD   REFERRING DIAG: M17.12 (ICD-10-CM) - Unilateral primary osteoarthritis, left knee    EVAL THERAPY DIAG:  Difficulty in walking, not elsewhere classified   Localized edema   Muscle weakness (generalized)   Stiffness of left knee, not elsewhere classified   Left knee pain, unspecified chronicity   Rationale for Evaluation and Treatment Rehabilitation   ONSET DATE: Surgery 10/18/2021   SUBJECTIVE:    SUBJECTIVE STATEMENT: Pt states that  she is feeling discouraged and has some tenderness in her quad today.   PERTINENT HISTORY: OA, skin cancer, HTN, previous Rt knee scope, atrial fibrillation  PAIN:  NPRS scale: 2/10 Pain location: Lt knee Pain description: tightness, throbbing, aching  Aggravating factors: standing, colder weather  Relieving factors: ice, elevation    PRECAUTIONS: None   WEIGHT BEARING RESTRICTIONS: No   FALLS:  Has patient fallen in last 6 months? No   LIVING ENVIRONMENT: Lives with: lives with their spouse Lives in: House/apartment Stairs: 1 stair Has following equipment at home: Walker - 2 wheeled   OCCUPATION: Retired   PLOF: Independent   PATIENT GOALS: Return to gardening, visiting grand kids     OBJECTIVE:    DIAGNOSTIC FINDINGS:  10/31/2021 review of FINDINGS: Changes of left knee replacement. No hardware or bony complicating feature. Soft tissue and joint space gas noted.   PATIENT SURVEYS:  11/20/2021 FOTO 31 (Goal 60)  10/31/2021 FOTO 38 (Goal 60 in 15 visits)   COGNITION: 10/31/2021 Overall cognitive status: Within functional limits for tasks assessed                         SENSATION: 10/31/2021 No complaints of peripheral pain or paresthesias   EDEMA:  12/05/2021:   10/31/2021 Noted but not objectively assessed     LOWER EXTREMITY ROM:   Active ROM Right 10/31/2021 Left 10/31/2021 Left 11/01/2021 Left 11/13/2021 Left 11/20/2021 Left 11/25/21 Left 11/27/2021   Left 12/05/2021  Knee flexion 112 40 50 58 62 62 73 PROM 65 AROM in supine heel slide (68 PROM)  Knee extension 0 -3 -3 -3 -2 -3 (LAQ) -2 PROM  -12 AROM in seated LAQ  BOLD MEASUREMENTS POST MANIP  (Blank rows = not tested)   LOWER EXTREMITY MMT:   MMT Right 10/31/2021 Left 10/31/2021 Left 12/05/2021  Knee flexion       Knee extension 4/5 2/5 3+/5  BOLD MEASUREMENTS POST MANIP   (Blank rows = not tested)   GAIT: 12/05/2021 : FWW use mod independence  10/31/2021 Distance walked: 50  feet Assistive device utilized: Environmental consultant - 2 wheeled Level of assistance:  Needs walker to reduce L knee WB Comments: Discussed the need for continued AD use to manage edema    TODAY'S TREATMENT                                                                          DATE:  12/09/2021 Therex: Nustep Lvl 5 5 mins UE/LE  Seated Lt knee AROM c pause in end ranges x 10 c contralateral leg   LAQ with leg weight focusing on quad contraction and eccentric control 2x10  Gait with stand alone cane 166f with CGA.   Manual: Seated Lt knee flexion c distraction/IR mobilization while pt performs theragun to thigh. Improvements noted with continued duration.  STM to quad.   Vasopneumatic Lt knee in elevation 34 deg high compression  12/05/2021 Therex: Nustep Lvl 5 10 mins UE/LE  Seated Lt knee AROM c pause in end ranges x 15 c contralateral leg movement opposite, x 15 after manual Supine LAQ in elevation for swelling control x 10 (cues for home use)  Manual: Seated Lt knee flexion c distraction/IR mobilization c movement c contract/relax holds into flexion.   Vasopneumatic Lt knee in elevation 34 deg high compression  12/03/2021 Tailgate Lt knee flexion 3 minutes Contract-relax knee flexion stretch with PT (seated with towel roll under L distal thigh), push into extension for 5 seconds, bend back for 10 seconds, then repeat 10 reps, rests PRN between reps due to pain  Functional Activities for stairs and sit to stand: Double leg press 68# 15X slow eccentrics Single (Left) leg press 25# 10X slow eccentrics   Vasopneumatic Left knee 34* High Pressure 15 minutes   11/27/2021 Recumbent bike Seat 6 AAROM only (unable to complete a revolution) for 8 minutes Quadriceps 2 sets of 10 reps for 5 seconds (toes back, press down and tighten thighs) with PT overpressure Tailgate Lt knee flexion 1 minute Contract-relax knee flexion stretch with PT (seated with towel roll under L distal thigh), push  into extension for 5 seconds, bend back for 10 seconds, then repeat 2 sets of 10 reps, rests PRN between reps due to pain   Vasopneumatic Left knee 34* High Pressure 15 minutes   11/25/21 TherEx Recumbent bike seat 6 x 8 min; partial revolutions for ROM Tailgate knee flexion x 2 min AA knee flexion sitting 10 x 10 sec hold, RLE providing overpressure Seated LAQ 2x10; 3-5 sec hold on Lt Long sitting AA heel slides x 10 reps with strap; Lt ROM measurements as noted above Sit to/from stand with min UE  support from elevated mat x 10 reps  Manual PROM sitting knee flexion to tolerance   PATIENT EDUCATION:  11/04/2021 Education details: HEP update Person educated: Patient Education method: Explanation, Demonstration, Tactile cues, Verbal cues, and Handouts Education comprehension: verbalized understanding, returned demonstration, verbal cues required, tactile cues required   HOME EXERCISE PROGRAM: Access Code: [email or web address] URL: [email or web address] Date: 11/04/2021 Prepared by: Michael Wright  Exercises - Supine Quadricep Sets  - 5 x daily - 7 x weekly - 2 sets - 10 reps - 5 second hold - Seated Knee Flexion AAROM  - 5 x daily - 7 x weekly - 1 sets - 1 reps - 3 minutes hold - Seated Long Arc Quad (Mirrored)  - 3-5 x daily - 7 x weekly - 1-2 sets - 10 reps - 2 hold - Ankle Pumps in Elevation  - 2-3 x daily - 7 x weekly - 1-2 sets - 10 reps   ASSESSMENT:   CLINICAL IMPRESSION: Pt returns to PT with reports of compliance with her HEP. Session with continued focus on mobility to tolerance. Pt able to tolerate increased flexion today with use of a theragun while PT performs OP into flexion. Pt states that she feels looser. Due to pt ambulating with standalone cane at home. Practiced in clinic with good forum and balance noted. Pt was able to negotiate a step with no issues. Encouraged pt to start performing SLR at home due to pt demonstrating ability to do so when setting up  vaso compression today.     OBJECTIVE IMPAIRMENTS: Abnormal gait, decreased activity tolerance, decreased balance, decreased coordination, decreased endurance, decreased knowledge of condition, difficulty walking, decreased ROM, decreased strength, decreased safety awareness, increased edema, impaired perceived functional ability, obesity, and pain.    ACTIVITY LIMITATIONS: carrying, bending, sitting, standing, squatting, stairs, bed mobility, dressing, and locomotion level   PARTICIPATION LIMITATIONS: cleaning, driving, shopping, community activity, and yard work   PERSONAL FACTORS: OA, skin cancer, HTN, previous Rt knee scope, atrial fibrillation are also affecting patient's functional outcome.    REHAB POTENTIAL: Good   CLINICAL DECISION MAKING: Stable/uncomplicated   EVALUATION COMPLEXITY: Low     GOALS: Goals reviewed with patient? Yes    SHORT TERM GOALS: Target date: 11/28/2021  Aeryn will improve Lt knee flexion AROM to 90 degrees Baseline: 40 degrees Goal status: NOT MET 12/03/21   2.  Christell will be independent and compliant 5/7 days with her starter HEP for 2 weeks Baseline:  Goal status: Met 11/13/2021     LONG TERM GOALS: Target date: 01/09/2022    Improve FOTO to 60 Baseline: 38 Goal status: INITIAL   2.  Belia will report Lt knee pain consistently 3/10 or better on the VAS Baseline: Can be 7/10 Goal status: On Going 12/03/2021   3.  Improve Lt knee AROM to 2-0-105 or better Baseline: 3-0-40 Goal status: Partially Met 12/03/2021   4.  Improve Lt knee strength as assessed by MMT, hand-held dynamometer and transition to a cane or no AD Baseline: Walker and poor (2/5 MMT) strength Goal status: On Going 12/03/2021   5.  Twala will be compliant and independent with her long-term HEP at DC Baseline: Started 10/31/2021 Goal status: On Going 12/03/2021     PLAN:   PT FREQUENCY:  2-3x/week   PT DURATION: 10 weeks   PLANNED INTERVENTIONS: Therapeutic  exercises, Therapeutic activity, Neuromuscular re-education, Balance training, Gait training, Patient/Family education, Self Care, Joint mobilization, Stair training, Electrical stimulation, Cryotherapy, Vasopneumatic device, and Manual   therapy   PLAN FOR NEXT SESSION: Extended time spent in manual for flexion mobility gains.     Simone Yuds PT, DPT 12/09/21  2:53 PM       

## 2021-12-10 ENCOUNTER — Encounter: Payer: Self-pay | Admitting: Physical Therapy

## 2021-12-10 ENCOUNTER — Ambulatory Visit (INDEPENDENT_AMBULATORY_CARE_PROVIDER_SITE_OTHER): Payer: Medicare Other | Admitting: Physical Therapy

## 2021-12-10 DIAGNOSIS — R262 Difficulty in walking, not elsewhere classified: Secondary | ICD-10-CM | POA: Diagnosis not present

## 2021-12-10 DIAGNOSIS — M6281 Muscle weakness (generalized): Secondary | ICD-10-CM

## 2021-12-10 DIAGNOSIS — M25562 Pain in left knee: Secondary | ICD-10-CM

## 2021-12-10 DIAGNOSIS — R600 Localized edema: Secondary | ICD-10-CM | POA: Diagnosis not present

## 2021-12-10 NOTE — Therapy (Signed)
OUTPATIENT PHYSICAL THERAPY TREATMENT  Patient Name: Bridget [name] MRN: [id number] DOB:23-Sep-1945, 76 y.o., female Today's Date: 12/10/2021   END OF SESSION:   PT End of Session - 12/10/21 1115     Visit Number 16    Number of Visits 18    Progress Note Due on Visit 18    PT Start Time 1100    PT Stop Time 1140    PT Time Calculation (min) 40 min    Equipment Utilized During Treatment Gait belt    Activity Tolerance Patient tolerated treatment well    Behavior During Therapy WFL for tasks assessed/performed                 Past Medical History:  Diagnosis Date   Arthritis    Cancer ([place])    basal cell cancer removed on face   Hypertension    Seasonal allergies    Sleep apnea    Past Surgical History:  Procedure Laterality Date   BREAST EXCISIONAL BIOPSY Left    CHOLECYSTECTOMY  02/2010   KNEE SURGERY Right    Meniscal tear   OOPHORECTOMY  01/2003   RSO AT [facility]   TOTAL KNEE ARTHROPLASTY Left 10/18/2021   Procedure: LEFT TOTAL KNEE ARTHROPLASTY;  Surgeon: Mcarthur Rossetti, MD;  Location: [facility];  Service: Orthopedics;  Laterality: Left;   VAGINAL HYSTERECTOMY  01/2003   TV[facility],RSO A&P REPAIR   WISDOM TOOTH EXTRACTION     Patient Active Problem List   Diagnosis Date Noted   Status post total left knee replacement 10/18/2021   Degenerative arthritis of left knee 11/24/2019   Secondary hypercoagulable state ([place]) 01/26/2019   Greater trochanteric bursitis of right hip 09/23/2018   Lumbar radiculopathy 08/12/2018   Gluteal tendonitis of right buttock 07/29/2018   Atrial fibrillation ([place]) 07/21/2018   Arthritis of left acromioclavicular joint 04/26/2018   Partial nontraumatic tear of left rotator cuff 04/26/2018   Hypertension    Seasonal allergies      THERAPY DIAG:  Difficulty in walking, not elsewhere classified  Muscle weakness (generalized)  Localized edema  Left knee pain, unspecified chronicity  PCP: Jonathon Jordan, MD    REFERRING PROVIDER: Mcarthur Rossetti, MD   REFERRING DIAG: [phone] (ICD-10-CM) - Unilateral primary osteoarthritis, left knee    EVAL THERAPY DIAG:  Difficulty in walking, not elsewhere classified   Localized edema   Muscle weakness (generalized)   Stiffness of left knee, not elsewhere classified   Left knee pain, unspecified chronicity   Rationale for Evaluation and Treatment Rehabilitation   ONSET DATE: Surgery 10/18/2021   SUBJECTIVE:    SUBJECTIVE STATEMENT: Knee is less sore this week; still having some pain in quad  PERTINENT HISTORY: OA, skin cancer, HTN, previous Rt knee scope, atrial fibrillation  PAIN:  NPRS scale: 2/10 Pain location: Lt knee Pain description: tightness, throbbing, aching  Aggravating factors: standing, colder weather  Relieving factors: ice, elevation    PRECAUTIONS: None   WEIGHT BEARING RESTRICTIONS: No   FALLS:  Has patient fallen in last 6 months? No   LIVING ENVIRONMENT: Lives with: lives with their spouse Lives in: House/apartment Stairs: 1 stair Has following equipment at home: Environmental consultant - 2 wheeled   OCCUPATION: Retired   PLOF: Independent   PATIENT GOALS: Return to gardening, visiting grand kids     OBJECTIVE:    DIAGNOSTIC FINDINGS:  10/31/2021 review of FINDINGS: Changes of left knee replacement. No hardware or bony complicating feature. Soft tissue and joint space gas  noted.   PATIENT SURVEYS:  11/20/2021 FOTO 31 (Goal 60)  10/31/2021 FOTO 38 (Goal 60 in 15 visits)   COGNITION: 10/31/2021 Overall cognitive status: Within functional limits for tasks assessed                         SENSATION: 10/31/2021 No complaints of peripheral pain or paresthesias   EDEMA:  12/05/2021:   10/31/2021 Noted but not objectively assessed     LOWER EXTREMITY ROM:   Active ROM Right 10/31/2021 Left 10/31/2021 Left 11/01/2021 Left 11/13/2021 Left 11/20/2021 Left 11/25/21 Left 11/27/2021 Left 12/05/2021   Knee flexion 112 40 50 58 62 62 73 PROM 65 AROM in supine heel slide (68 PROM)  Knee extension 0 -3 -3 -3 -2 -3 (LAQ) -2 PROM  -12 AROM in seated LAQ  BOLD MEASUREMENTS POST MANIP  (Blank rows = not tested)   LOWER EXTREMITY MMT:   MMT Right 10/31/2021 Left 10/31/2021 Left 12/05/2021  Knee flexion       Knee extension 4/5 2/5 3+/5  BOLD MEASUREMENTS POST MANIP   (Blank rows = not tested)   GAIT: 12/05/2021 : FWW use mod independence  10/31/2021 Distance walked: 50 feet Assistive device utilized: Environmental consultant - 2 wheeled Level of assistance:  Needs walker to reduce L knee WB Comments: Discussed the need for continued AD use to manage edema    TODAY'S TREATMENT                                                                          DATE:  12/10/21 TherEx SciFit bike seat 10; partial revolutions x 8 min Tailgate knee flexion x 3 min AA knee flexion with percussive device 10 x 10 sec hold Seated LAQ with 4# LLE 2x10; 5 sec hold Sit to/from stand 2 x 10 reps without UE support; elevated mat table Leg Press 68# 2x10; then LLE only 25# 2x10   Manual Seated Lt knee flexion with percussive device on quad; utilizing contract relax   12/09/2021 Therex: Nustep Lvl 5 5 mins UE/LE  Seated Lt knee AROM c pause in end ranges x 10 c contralateral leg   LAQ with leg weight focusing on quad contraction and eccentric control 2x10  Gait with stand alone cane 188f with CGA.   Manual: Seated Lt knee flexion c distraction/IR mobilization while pt performs theragun to thigh. Improvements noted with continued duration.  STM to quad.   Vasopneumatic Lt knee in elevation 34 deg high compression  12/05/2021 Therex: Nustep Lvl 5 10 mins UE/LE  Seated Lt knee AROM c pause in end ranges x 15 c contralateral leg movement opposite, x 15 after manual Supine LAQ in elevation for swelling control x 10 (cues for home use)  Manual: Seated Lt knee flexion c distraction/IR mobilization c movement c  contract/relax holds into flexion.   Vasopneumatic Lt knee in elevation 34 deg high compression  12/03/2021 Tailgate Lt knee flexion 3 minutes Contract-relax knee flexion stretch with PT (seated with towel roll under L distal thigh), push into extension for 5 seconds, bend back for 10 seconds, then repeat 10 reps, rests PRN between reps due to pain  Functional Activities for stairs and sit to  stand: Double leg press 68# 15X slow eccentrics Single (Left) leg press 25# 10X slow eccentrics   Vasopneumatic Left knee 34* High Pressure 15 minutes   11/27/2021 Recumbent bike Seat 6 AAROM only (unable to complete a revolution) for 8 minutes Quadriceps 2 sets of 10 reps for 5 seconds (toes back, press down and tighten thighs) with PT overpressure Tailgate Lt knee flexion 1 minute Contract-relax knee flexion stretch with PT (seated with towel roll under L distal thigh), push into extension for 5 seconds, bend back for 10 seconds, then repeat 2 sets of 10 reps, rests PRN between reps due to pain   Vasopneumatic Left knee 34* High Pressure 15 minutes   11/25/21 TherEx Recumbent bike seat 6 x 8 min; partial revolutions for ROM Tailgate knee flexion x 2 min AA knee flexion sitting 10 x 10 sec hold, RLE providing overpressure Seated LAQ 2x10; 3-5 sec hold on Lt Long sitting AA heel slides x 10 reps with strap; Lt ROM measurements as noted above Sit to/from stand with min UE support from elevated mat x 10 reps  Manual PROM sitting knee flexion to tolerance   PATIENT EDUCATION:  11/04/2021 Education details: HEP update Person educated: Patient Education method: Explanation, Demonstration, Tactile cues, Verbal cues, and Handouts Education comprehension: verbalized understanding, returned demonstration, verbal cues required, tactile cues required   HOME EXERCISE PROGRAM: Access Code: [email or web address] URL: [email or web address] Date: 11/04/2021 Prepared by: Scot Jun  Exercises - Supine Quadricep Sets  - 5 x daily - 7 x weekly - 2 sets - 10 reps - 5 second hold - Seated Knee Flexion AAROM  - 5 x daily - 7 x weekly - 1 sets - 1 reps - 3 minutes hold - Seated Long Arc Quad (Mirrored)  - 3-5 x daily - 7 x weekly - 1-2 sets - 10 reps - 2 hold - Ankle Pumps in Elevation  - 2-3 x daily - 7 x weekly - 1-2 sets - 10 reps   ASSESSMENT:   CLINICAL IMPRESSION: Pt tolerated session well today with continued focus on ROM and functional strength.  Will continue to benefit from PT to maximize function.   OBJECTIVE IMPAIRMENTS: Abnormal gait, decreased activity tolerance, decreased balance, decreased coordination, decreased endurance, decreased knowledge of condition, difficulty walking, decreased ROM, decreased strength, decreased safety awareness, increased edema, impaired perceived functional ability, obesity, and pain.    ACTIVITY LIMITATIONS: carrying, bending, sitting, standing, squatting, stairs, bed mobility, dressing, and locomotion level   PARTICIPATION LIMITATIONS: cleaning, driving, shopping, community activity, and yard work   PERSONAL FACTORS: OA, skin cancer, HTN, previous Rt knee scope, atrial fibrillation are also affecting patient's functional outcome.    REHAB POTENTIAL: Good   CLINICAL DECISION MAKING: Stable/uncomplicated   EVALUATION COMPLEXITY: Low     GOALS: Goals reviewed with patient? Yes    SHORT TERM GOALS: Target date: 11/28/2021  Amesha will improve Lt knee flexion AROM to 90 degrees Baseline: 40 degrees Goal status: NOT MET 12/03/21   2.  Teckla will be independent and compliant 5/7 days with her starter HEP for 2 weeks Baseline:  Goal status: Met 11/13/2021     LONG TERM GOALS: Target date: 01/09/2022    Improve FOTO to 60 Baseline: 38 Goal status: INITIAL   2.  Kymoni will report Lt knee pain consistently 3/10 or better on the VAS Baseline: Can be 7/10 Goal status: On Going 12/03/2021   3.  Improve Lt knee  AROM to 2-0-105 or better Baseline: 3-0-40 Goal  status: Partially Met 12/03/2021   4.  Improve Lt knee strength as assessed by MMT, hand-held dynamometer and transition to a cane or no AD Baseline: Walker and poor (2/5 MMT) strength Goal status: On Going 12/03/2021   5.  Coni will be compliant and independent with her long-term HEP at DC Baseline: Started 10/31/2021 Goal status: On Going 12/03/2021     PLAN:   PT FREQUENCY:  2-3x/week   PT DURATION: 10 weeks   PLANNED INTERVENTIONS: Therapeutic exercises, Therapeutic activity, Neuromuscular re-education, Balance training, Gait training, Patient/Family education, Self Care, Joint mobilization, Stair training, Electrical stimulation, Cryotherapy, Vasopneumatic device, and Manual therapy   PLAN FOR NEXT SESSION: manual for flexion, functional strength     Laureen Abrahams, PT, DPT 12/10/21 12:50 PM

## 2021-12-11 ENCOUNTER — Ambulatory Visit (INDEPENDENT_AMBULATORY_CARE_PROVIDER_SITE_OTHER): Payer: Medicare Other | Admitting: Physical Therapy

## 2021-12-11 ENCOUNTER — Encounter: Payer: Self-pay | Admitting: Physical Therapy

## 2021-12-11 DIAGNOSIS — M6281 Muscle weakness (generalized): Secondary | ICD-10-CM | POA: Diagnosis not present

## 2021-12-11 DIAGNOSIS — R600 Localized edema: Secondary | ICD-10-CM

## 2021-12-11 DIAGNOSIS — M25562 Pain in left knee: Secondary | ICD-10-CM

## 2021-12-11 DIAGNOSIS — R262 Difficulty in walking, not elsewhere classified: Secondary | ICD-10-CM

## 2021-12-11 NOTE — Therapy (Signed)
OUTPATIENT PHYSICAL THERAPY TREATMENT  Patient Name: Bridget [name] MRN: [id number] DOB:16-May-1945, 76 y.o., female Today's Date: 12/11/2021   END OF SESSION:   PT End of Session - 12/11/21 1344     Visit Number 17    Number of Visits 18    Progress Note Due on Visit 18    PT Start Time 1300    PT Stop Time 1340    PT Time Calculation (min) 40 min    Activity Tolerance Patient tolerated treatment well    Behavior During Therapy WFL for tasks assessed/performed                  Past Medical History:  Diagnosis Date   Arthritis    Cancer ([employer])    basal cell cancer removed on face   Hypertension    Seasonal allergies    Sleep apnea    Past Surgical History:  Procedure Laterality Date   BREAST EXCISIONAL BIOPSY Left    CHOLECYSTECTOMY  02/2010   KNEE SURGERY Right    Meniscal tear   OOPHORECTOMY  01/2003   RSO AT [facility]   TOTAL KNEE ARTHROPLASTY Left 10/18/2021   Procedure: LEFT TOTAL KNEE ARTHROPLASTY;  Surgeon: Mcarthur Rossetti, MD;  Location: [facility];  Service: Orthopedics;  Laterality: Left;   VAGINAL HYSTERECTOMY  01/2003   TVH,RSO A&P REPAIR   WISDOM TOOTH EXTRACTION     Patient Active Problem List   Diagnosis Date Noted   Status post total left knee replacement 10/18/2021   Degenerative arthritis of left knee 11/24/2019   Secondary hypercoagulable state ([place]) 01/26/2019   Greater trochanteric bursitis of right hip 09/23/2018   Lumbar radiculopathy 08/12/2018   Gluteal tendonitis of right buttock 07/29/2018   Atrial fibrillation ([place]) 07/21/2018   Arthritis of left acromioclavicular joint 04/26/2018   Partial nontraumatic tear of left rotator cuff 04/26/2018   Hypertension    Seasonal allergies      THERAPY DIAG:  Muscle weakness (generalized)  Localized edema  Difficulty in walking, not elsewhere classified  Left knee pain, unspecified chronicity  PCP: Jonathon Jordan, MD   REFERRING PROVIDER: Mcarthur Rossetti, MD    REFERRING DIAG: [phone] (ICD-10-CM) - Unilateral primary osteoarthritis, left knee    EVAL THERAPY DIAG:  Difficulty in walking, not elsewhere classified   Localized edema   Muscle weakness (generalized)   Stiffness of left knee, not elsewhere classified   Left knee pain, unspecified chronicity   Rationale for Evaluation and Treatment Rehabilitation   ONSET DATE: Surgery 10/18/2021   SUBJECTIVE:    SUBJECTIVE STATEMENT: Pt expresses continued frustration with progress. She states that she adjusted her [facility] and has improved lower back symptoms.   PERTINENT HISTORY: OA, skin cancer, HTN, previous Rt knee scope, atrial fibrillation  PAIN:  NPRS scale: 2/10 Pain location: Lt knee Pain description: tightness, throbbing, aching  Aggravating factors: standing, colder weather  Relieving factors: ice, elevation    PRECAUTIONS: None   WEIGHT BEARING RESTRICTIONS: No   FALLS:  Has patient fallen in last 6 months? No   LIVING ENVIRONMENT: Lives with: lives with their spouse Lives in: House/apartment Stairs: 1 stair Has following equipment at home: Environmental consultant - 2 wheeled   OCCUPATION: Retired   PLOF: Independent   PATIENT GOALS: Return to gardening, visiting grand kids     OBJECTIVE:    DIAGNOSTIC FINDINGS:  10/31/2021 review of FINDINGS: Changes of left knee replacement. No hardware or bony complicating feature. Soft tissue and joint space gas  noted.   PATIENT SURVEYS:  11/20/2021 FOTO 31 (Goal 60)  10/31/2021 FOTO 38 (Goal 60 in 15 visits)   COGNITION: 10/31/2021 Overall cognitive status: Within functional limits for tasks assessed                         SENSATION: 10/31/2021 No complaints of peripheral pain or paresthesias   EDEMA:  12/05/2021:   10/31/2021 Noted but not objectively assessed     LOWER EXTREMITY ROM:   Active ROM Right 10/31/2021 Left 10/31/2021 Left 11/01/2021 Left 11/13/2021 Left 11/20/2021 Left 11/25/21 Left 11/27/2021  Left 12/05/2021  Knee flexion 112 40 50 58 62 62 73 PROM 65 AROM in supine heel slide (68 PROM)  Knee extension 0 -3 -3 -3 -2 -3 (LAQ) -2 PROM  -12 AROM in seated LAQ  BOLD MEASUREMENTS POST MANIP  (Blank rows = not tested)   LOWER EXTREMITY MMT:   MMT Right 10/31/2021 Left 10/31/2021 Left 12/05/2021  Knee flexion       Knee extension 4/5 2/5 3+/5  BOLD MEASUREMENTS POST MANIP   (Blank rows = not tested)   GAIT: 12/05/2021 : FWW use mod independence  10/31/2021 Distance walked: 50 feet Assistive device utilized: Environmental consultant - 2 wheeled Level of assistance:  Needs walker to reduce L knee WB Comments: Discussed the need for continued AD use to manage edema    TODAY'S TREATMENT                                                                           DATE:  12/11/21 TherEx Nustep lvl 4, 8 min Tailgate knee flexion x 2 min AA knee flexion with percussive device 10 x 10 sec hold Seated LAQ with 4# LLE 2x10; 5 sec hold Sit to/from stand 2 x 10 reps without UE support; elevated mat table Leg Press 68# 2x10; then LLE only 25# 2x10  Manual Seated Lt knee flexion with percussive device on quad; utilizing contract relax                                                                          DATE:  12/10/21 TherEx SciFit bike seat 10; partial revolutions x 8 min Tailgate knee flexion x 3 min AA knee flexion with percussive device 10 x 10 sec hold Seated LAQ with 4# LLE 2x10; 5 sec hold Sit to/from stand 2 x 10 reps without UE support; elevated mat table. L LE kicked out to place more emphasis on L LE.  Leg Press 68# 2x10; then LLE only 25# 2x10   Manual Seated Lt knee flexion with percussive device on quad; utilizing contract relax   12/09/2021 Therex: Nustep Lvl 5 5 mins UE/LE  Seated Lt knee AROM c pause in end ranges x 10 c contralateral leg   LAQ with leg weight focusing on quad contraction and eccentric control 2x10  Gait with stand alone cane 159f with CGA.  Manual: Seated Lt knee flexion c distraction/IR mobilization while pt performs theragun to thigh. Improvements noted with continued duration.  STM to quad.   Vasopneumatic Lt knee in elevation 34 deg high compression  12/05/2021 Therex: Nustep Lvl 5 10 mins UE/LE  Seated Lt knee AROM c pause in end ranges x 15 c contralateral leg movement opposite, x 15 after manual Supine LAQ in elevation for swelling control x 10 (cues for home use)  Manual: Seated Lt knee flexion c distraction/IR mobilization c movement c contract/relax holds into flexion.   Vasopneumatic Lt knee in elevation 34 deg high compression  12/03/2021 Tailgate Lt knee flexion 3 minutes Contract-relax knee flexion stretch with PT (seated with towel roll under L distal thigh), push into extension for 5 seconds, bend back for 10 seconds, then repeat 10 reps, rests PRN between reps due to pain  Functional Activities for stairs and sit to stand: Double leg press 68# 15X slow eccentrics Single (Left) leg press 25# 10X slow eccentrics   Vasopneumatic Left knee 34* High Pressure 15 minutes   11/27/2021 Recumbent bike Seat 6 AAROM only (unable to complete a revolution) for 8 minutes Quadriceps 2 sets of 10 reps for 5 seconds (toes back, press down and tighten thighs) with PT overpressure Tailgate Lt knee flexion 1 minute Contract-relax knee flexion stretch with PT (seated with towel roll under L distal thigh), push into extension for 5 seconds, bend back for 10 seconds, then repeat 2 sets of 10 reps, rests PRN between reps due to pain   Vasopneumatic Left knee 34* High Pressure 15 minutes   11/25/21 TherEx Recumbent bike seat 6 x 8 min; partial revolutions for ROM Tailgate knee flexion x 2 min AA knee flexion sitting 10 x 10 sec hold, RLE providing overpressure Seated LAQ 2x10; 3-5 sec hold on Lt Long sitting AA heel slides x 10 reps with strap; Lt ROM measurements as noted above Sit to/from stand with min UE  support from elevated mat x 10 reps  Manual PROM sitting knee flexion to tolerance   PATIENT EDUCATION:  11/04/2021 Education details: HEP update Person educated: Patient Education method: Explanation, Demonstration, Tactile cues, Verbal cues, and Handouts Education comprehension: verbalized understanding, returned demonstration, verbal cues required, tactile cues required   HOME EXERCISE PROGRAM: Access Code: [email or web address] URL: [email or web address] Date: 11/04/2021 Prepared by: Scot Jun  Exercises - Supine Quadricep Sets  - 5 x daily - 7 x weekly - 2 sets - 10 reps - 5 second hold - Seated Knee Flexion AAROM  - 5 x daily - 7 x weekly - 1 sets - 1 reps - 3 minutes hold - Seated Long Arc Quad (Mirrored)  - 3-5 x daily - 7 x weekly - 1-2 sets - 10 reps - 2 hold - Ankle Pumps in Elevation  - 2-3 x daily - 7 x weekly - 1-2 sets - 10 reps   ASSESSMENT:   CLINICAL IMPRESSION: Pt arrives today with SPC. Pt tolerated session well today with continued focus on ROM and functional strength. Further challenged her with sit to stands with increased emphasis on L LE WB with good response. Pt reports lower back pain when attempting to perform sit to stand from lower surface. Will continue to benefit from PT to maximize function.   OBJECTIVE IMPAIRMENTS: Abnormal gait, decreased activity tolerance, decreased balance, decreased coordination, decreased endurance, decreased knowledge of condition, difficulty walking, decreased ROM, decreased strength, decreased safety awareness, increased edema, impaired perceived functional ability, obesity, and pain.  ACTIVITY LIMITATIONS: carrying, bending, sitting, standing, squatting, stairs, bed mobility, dressing, and locomotion level   PARTICIPATION LIMITATIONS: cleaning, driving, shopping, community activity, and yard work   PERSONAL FACTORS: OA, skin cancer, HTN, previous Rt knee scope, atrial fibrillation are also affecting patient's  functional outcome.    REHAB POTENTIAL: Good   CLINICAL DECISION MAKING: Stable/uncomplicated   EVALUATION COMPLEXITY: Low     GOALS: Goals reviewed with patient? Yes    SHORT TERM GOALS: Target date: 11/28/2021  Yoshino will improve Lt knee flexion AROM to 90 degrees Baseline: 40 degrees Goal status: NOT MET 12/03/21   2.  Madisen will be independent and compliant 5/7 days with her starter HEP for 2 weeks Baseline:  Goal status: Met 11/13/2021     LONG TERM GOALS: Target date: 01/09/2022    Improve FOTO to 60 Baseline: 38 Goal status: INITIAL   2.  Aretha will report Lt knee pain consistently 3/10 or better on the VAS Baseline: Can be 7/10 Goal status: On Going 12/03/2021   3.  Improve Lt knee AROM to 2-0-105 or better Baseline: 3-0-40 Goal status: Partially Met 12/03/2021   4.  Improve Lt knee strength as assessed by MMT, hand-held dynamometer and transition to a cane or no AD Baseline: Walker and poor (2/5 MMT) strength Goal status: On Going 12/03/2021   5.  Beola will be compliant and independent with her long-term HEP at DC Baseline: Started 10/31/2021 Goal status: On Going 12/03/2021     PLAN:   PT FREQUENCY:  2-3x/week   PT DURATION: 10 weeks   PLANNED INTERVENTIONS: Therapeutic exercises, Therapeutic activity, Neuromuscular re-education, Balance training, Gait training, Patient/Family education, Self Care, Joint mobilization, Stair training, Electrical stimulation, Cryotherapy, Vasopneumatic device, and Manual therapy   PLAN FOR NEXT SESSION: manual for flexion, functional strength     Rudi Heap PT, DPT 12/11/21  1:46 PM

## 2021-12-12 ENCOUNTER — Encounter: Payer: Self-pay | Admitting: Physical Therapy

## 2021-12-12 ENCOUNTER — Ambulatory Visit (INDEPENDENT_AMBULATORY_CARE_PROVIDER_SITE_OTHER): Payer: Medicare Other | Admitting: Physical Therapy

## 2021-12-12 DIAGNOSIS — M25562 Pain in left knee: Secondary | ICD-10-CM

## 2021-12-12 DIAGNOSIS — R262 Difficulty in walking, not elsewhere classified: Secondary | ICD-10-CM

## 2021-12-12 DIAGNOSIS — M6281 Muscle weakness (generalized): Secondary | ICD-10-CM | POA: Diagnosis not present

## 2021-12-12 DIAGNOSIS — M25662 Stiffness of left knee, not elsewhere classified: Secondary | ICD-10-CM | POA: Diagnosis not present

## 2021-12-12 DIAGNOSIS — R600 Localized edema: Secondary | ICD-10-CM

## 2021-12-12 NOTE — Therapy (Signed)
OUTPATIENT PHYSICAL THERAPY TREATMENT/PROGRESS NOTE  Progress Note Reporting Period 11/22/21 to 12/12/21   See note below for Objective Data and Assessment of Progress/Goals.      Patient Name: Bridget [name] MRN: [id number] DOB:[birth year], 76 y.o., female Today's Date: 12/12/2021   END OF SESSION:   PT End of Session - 12/12/21 1352     Visit Number 18    Number of Visits 36    Date for PT Re-Evaluation 01/20/22    Progress Note Due on Visit 28    PT Start Time 1884    PT Stop Time 1660    PT Time Calculation (min) 52 min    Activity Tolerance Patient tolerated treatment well    Behavior During Therapy [facility] for tasks assessed/performed                   Past Medical History:  Diagnosis Date   Arthritis    Cancer ([place])    basal cell cancer removed on face   Hypertension    Seasonal allergies    Sleep apnea    Past Surgical History:  Procedure Laterality Date   BREAST EXCISIONAL BIOPSY Left    CHOLECYSTECTOMY  02/2010   KNEE SURGERY Right    Meniscal tear   OOPHORECTOMY  01/2003   RSO AT [facility]   TOTAL KNEE ARTHROPLASTY Left 10/18/2021   Procedure: LEFT TOTAL KNEE ARTHROPLASTY;  Surgeon: Mcarthur Rossetti, MD;  Location: [facility];  Service: Orthopedics;  Laterality: Left;   VAGINAL HYSTERECTOMY  01/2003   [facility],RSO A&P REPAIR   WISDOM TOOTH EXTRACTION     Patient Active Problem List   Diagnosis Date Noted   Status post total left knee replacement 10/18/2021   Degenerative arthritis of left knee 11/24/2019   Secondary hypercoagulable state ([place]) 01/26/2019   Greater trochanteric bursitis of right hip 09/23/2018   Lumbar radiculopathy 08/12/2018   Gluteal tendonitis of right buttock 07/29/2018   Atrial fibrillation ([place]) 07/21/2018   Arthritis of left acromioclavicular joint 04/26/2018   Partial nontraumatic tear of left rotator cuff 04/26/2018   Hypertension    Seasonal allergies      THERAPY DIAG:  Muscle weakness  (generalized)  Localized edema  Difficulty in walking, not elsewhere classified  Left knee pain, unspecified chronicity  Stiffness of left knee, not elsewhere classified  PCP: Jonathon Jordan, MD   REFERRING PROVIDER: Mcarthur Rossetti, MD   REFERRING DIAG: [phone] (ICD-10-CM) - Unilateral primary osteoarthritis, left knee    EVAL THERAPY DIAG:  Difficulty in walking, not elsewhere classified   Localized edema   Muscle weakness (generalized)   Stiffness of left knee, not elsewhere classified   Left knee pain, unspecified chronicity   Rationale for Evaluation and Treatment Rehabilitation   ONSET DATE: Surgery 10/18/2021   SUBJECTIVE:    SUBJECTIVE STATEMENT: Knee is still stiff.  Walking with cane today.  PERTINENT HISTORY: OA, skin cancer, HTN, previous Rt knee scope, atrial fibrillation  PAIN:  NPRS scale: 3/10 Pain location: Lt knee Pain description: tightness, throbbing, aching  Aggravating factors: standing, colder weather  Relieving factors: ice, elevation    PRECAUTIONS: None   WEIGHT BEARING RESTRICTIONS: No   FALLS:  Has patient fallen in last 6 months? No   LIVING ENVIRONMENT: Lives with: lives with their spouse Lives in: House/apartment Stairs: 1 stair Has following equipment at home: Gilford Rile - 2 wheeled   OCCUPATION: Retired   PLOF: Independent   PATIENT GOALS: Return to gardening, visiting grand kids  OBJECTIVE:    DIAGNOSTIC FINDINGS:  10/31/2021 review of FINDINGS: Changes of left knee replacement. No hardware or bony complicating feature. Soft tissue and joint space gas noted.   PATIENT SURVEYS:  11/20/2021  FOTO 31 (Goal 60)  10/31/2021 FOTO 38 (Goal 60 in 15 visits)   COGNITION: 10/31/2021 Overall cognitive status: Within functional limits for tasks assessed                         SENSATION: 10/31/2021 No complaints of peripheral pain or paresthesias   EDEMA:  12/05/2021:   10/31/2021 Noted but not  objectively assessed     LOWER EXTREMITY ROM:   Active ROM Right 10/31/2021 Left 10/31/2021 Left 11/01/2021 Left 11/13/2021 Left 11/20/2021 Left 11/25/21 Left 11/27/2021 Left 12/05/2021 Left 12/12/21  Knee flexion 112 40 50 58 62 62 73 PROM 65 AROM in supine heel slide (68 PROM) AA 96 Active 91 (Supine heel slide)  Knee extension 0 -3 -3 -3 -2 -3 (LAQ) -2 PROM  -12 AROM in seated LAQ -5 (seated LAQ)  BOLD MEASUREMENTS POST MANIP  (Blank rows = not tested)   LOWER EXTREMITY MMT:   MMT Right 10/31/2021 Left 10/31/2021 Left 12/05/2021  Knee flexion       Knee extension 4/5 2/5 3+/5  BOLD MEASUREMENTS POST MANIP   (Blank rows = not tested)   GAIT: 12/05/2021 : FWW use mod independence  10/31/2021 Distance walked: 50 feet Assistive device utilized: Environmental consultant - 2 wheeled Level of assistance:  Needs walker to reduce L knee WB Comments: Discussed the need for continued AD use to manage edema    TODAY'S TREATMENT  DATE:  12/12/21 TherEx SciFit bike seat 10; partial revolutions x 8 min Leg Press bil 75#  2x10; LLE only 37# 2x10  LAQ 5# 2x10 on Lt; 5 sec hold AA knee flexion 10 x 10 sec hold with RLE providing overpressure AA heel slides x 10 reps ROM measurements as noted above  Vasopneumatic Lt knee in elevation 34 deg med compression x 10 min   12/11/21 TherEx Nustep lvl 4, 8 min Tailgate knee flexion x 2 min AA knee flexion with percussive device 10 x 10 sec hold Seated LAQ with 4# LLE 2x10; 5 sec hold Sit to/from stand 2 x 10 reps without UE support; elevated mat table Leg Press 68# 2x10; then LLE only 25# 2x10  Manual Seated Lt knee flexion with percussive device on quad; utilizing contract relax                                                                          DATE:  12/10/21 TherEx SciFit bike seat 10; partial revolutions x 8 min Tailgate knee flexion x 3 min AA knee flexion with percussive device 10 x 10 sec hold Seated LAQ with 4# LLE 2x10; 5 sec  hold Sit to/from stand 2 x 10 reps without UE support; elevated mat table. L LE kicked out to place more emphasis on L LE.  Leg Press 68# 2x10; then LLE only 25# 2x10   Manual Seated Lt knee flexion with percussive device on quad; utilizing contract relax   12/09/2021 Therex: Nustep Lvl 5 5 mins UE/LE  Seated Lt  knee AROM c pause in end ranges x 10 c contralateral leg   LAQ with leg weight focusing on quad contraction and eccentric control 2x10  Gait with stand alone cane 179f with CGA.   Manual: Seated Lt knee flexion c distraction/IR mobilization while pt performs theragun to thigh. Improvements noted with continued duration.  STM to quad.   Vasopneumatic Lt knee in elevation 34 deg high compression  12/05/2021 Therex: Nustep Lvl 5 10 mins UE/LE  Seated Lt knee AROM c pause in end ranges x 15 c contralateral leg movement opposite, x 15 after manual Supine LAQ in elevation for swelling control x 10 (cues for home use)  Manual: Seated Lt knee flexion c distraction/IR mobilization c movement c contract/relax holds into flexion.   Vasopneumatic Lt knee in elevation 34 deg high compression  PATIENT EDUCATION:  11/04/2021 Education details: HEP update Person educated: Patient Education method: Explanation, Demonstration, Tactile cues, Verbal cues, and Handouts Education comprehension: verbalized understanding, returned demonstration, verbal cues required, tactile cues required   HOME EXERCISE PROGRAM: Access Code: [email or web address]: [email or web address] Date: 11/04/2021 Prepared by: MScot Jun Exercises - Supine Quadricep Sets  - 5 x daily - 7 x weekly - 2 sets - 10 reps - 5 second hold - Seated Knee Flexion AAROM  - 5 x daily - 7 x weekly - 1 sets - 1 reps - 3 minutes hold - Seated Long Arc Quad (Mirrored)  - 3-5 x daily - 7 x weekly - 1-2 sets - 10 reps - 2 hold - Ankle Pumps in Elevation  - 2-3 x daily - 7 x weekly - 1-2 sets - 10 reps    ASSESSMENT:   CLINICAL IMPRESSION: Pt with good improvement in flexion noted today.  [name] LTGs are ongoing at this time.  She will benefit from continued PT to maximize function.   OBJECTIVE IMPAIRMENTS: Abnormal gait, decreased activity tolerance, decreased balance, decreased coordination, decreased endurance, decreased knowledge of condition, difficulty walking, decreased ROM, decreased strength, decreased safety awareness, increased edema, impaired perceived functional ability, obesity, and pain.    ACTIVITY LIMITATIONS: carrying, bending, sitting, standing, squatting, stairs, bed mobility, dressing, and locomotion level   PARTICIPATION LIMITATIONS: cleaning, driving, shopping, community activity, and yard work   PERSONAL FACTORS: OA, skin cancer, HTN, previous Rt knee scope, atrial fibrillation are also affecting patient's functional outcome.    REHAB POTENTIAL: Good   CLINICAL DECISION MAKING: Stable/uncomplicated   EVALUATION COMPLEXITY: Low     GOALS: Goals reviewed with patient? Yes    SHORT TERM GOALS: Target date: 11/28/2021  SAnabellwill improve Lt knee flexion AROM to 90 degrees Baseline: 40 degrees Goal status: NOT MET 12/03/21 (MET 12/12/21)   2.  SSumikowill be independent and compliant 5/7 days with her starter HEP for 2 weeks Baseline:  Goal status: Met 11/13/2021     LONG TERM GOALS: Target date: 01/09/2022    Improve FOTO to 60 Baseline: 38 Goal status: Ongoing 12/12/21   2.  SSynawill report Lt knee pain consistently 3/10 or better on the VAS Baseline: Can be 7/10 Goal status: On Going 12/12/2021   3.  Improve Lt knee AROM to 2-0-105 or better Baseline: 3-0-40 Goal status: Partially Met 12/12/2021   4.  Improve Lt knee strength as assessed by MMT, hand-held dynamometer and transition to a cane or no AD Baseline: Walker and poor (2/5 MMT) strength Goal status: On Going 12/12/2021   5.  SAdayshawill be compliant and independent with her  long-term HEP at  DC Baseline: Started 10/31/2021 Goal status: On Going 12/12/2021     PLAN:   PT FREQUENCY:  2-3x/week   PT DURATION: 10 weeks   PLANNED INTERVENTIONS: Therapeutic exercises, Therapeutic activity, Neuromuscular re-education, Balance training, Gait training, Patient/Family education, Self Care, Joint mobilization, Stair training, Electrical stimulation, Cryotherapy, Vasopneumatic device, and Manual therapy   PLAN FOR NEXT SESSION:  manual for flexion, functional strength     Laureen Abrahams, PT, DPT 12/12/21 2:29 PM

## 2021-12-12 NOTE — Therapy (Signed)
OUTPATIENT PHYSICAL THERAPY TREATMENT/PROGRESS NOTE  Progress Note Reporting Period 11/22/21 to 12/12/21   See note below for Objective Data and Assessment of Progress/Goals.      Patient Name: Bridget [name] MRN: [id number] DOB:16-May-1945, 76 y.o., female Today's Date: 12/13/2021   END OF SESSION:   PT End of Session - 12/13/21 1052     Visit Number 19    Number of Visits 36    Date for PT Re-Evaluation 01/20/22    Progress Note Due on Visit 30    PT Start Time 1100    PT Stop Time 1145    PT Time Calculation (min) 45 min    Activity Tolerance Patient tolerated treatment well    Behavior During Therapy WFL for tasks assessed/performed                    Past Medical History:  Diagnosis Date   Arthritis    Cancer ([place])    basal cell cancer removed on face   Hypertension    Seasonal allergies    Sleep apnea    Past Surgical History:  Procedure Laterality Date   BREAST EXCISIONAL BIOPSY Left    CHOLECYSTECTOMY  02/2010   KNEE SURGERY Right    Meniscal tear   OOPHORECTOMY  01/2003   RSO AT [facility]   TOTAL KNEE ARTHROPLASTY Left 10/18/2021   Procedure: LEFT TOTAL KNEE ARTHROPLASTY;  Surgeon: Mcarthur Rossetti, MD;  Location: [facility];  Service: Orthopedics;  Laterality: Left;   VAGINAL HYSTERECTOMY  01/2003   TVH,RSO A&P REPAIR   WISDOM TOOTH EXTRACTION     Patient Active Problem List   Diagnosis Date Noted   Status post total left knee replacement 10/18/2021   Degenerative arthritis of left knee 11/24/2019   Secondary hypercoagulable state ([place]) 01/26/2019   Greater trochanteric bursitis of right hip 09/23/2018   Lumbar radiculopathy 08/12/2018   Gluteal tendonitis of right buttock 07/29/2018   Atrial fibrillation ([place]) 07/21/2018   Arthritis of left acromioclavicular joint 04/26/2018   Partial nontraumatic tear of left rotator cuff 04/26/2018   Hypertension    Seasonal allergies      THERAPY DIAG:  Muscle weakness  (generalized)  Localized edema  Difficulty in walking, not elsewhere classified  Left knee pain, unspecified chronicity  Stiffness of left knee, not elsewhere classified  PCP: Jonathon Jordan, MD   REFERRING PROVIDER: Mcarthur Rossetti, MD   REFERRING DIAG: [phone] (ICD-10-CM) - Unilateral primary osteoarthritis, left knee    EVAL THERAPY DIAG:  Difficulty in walking, not elsewhere classified   Localized edema   Muscle weakness (generalized)   Stiffness of left knee, not elsewhere classified   Left knee pain, unspecified chronicity   Rationale for Evaluation and Treatment Rehabilitation   ONSET DATE: Surgery 10/18/2021   SUBJECTIVE:    SUBJECTIVE STATEMENT: Pt states that she is very stiff today. She reports not being very active this morning due to the earlier appt time today.   PERTINENT HISTORY: OA, skin cancer, HTN, previous Rt knee scope, atrial fibrillation  PAIN:  NPRS scale: 3/10 Pain location: Lt knee Pain description: tightness, throbbing, aching  Aggravating factors: standing, colder weather  Relieving factors: ice, elevation    PRECAUTIONS: None   WEIGHT BEARING RESTRICTIONS: No   FALLS:  Has patient fallen in last 6 months? No   LIVING ENVIRONMENT: Lives with: lives with their spouse Lives in: House/apartment Stairs: 1 stair Has following equipment at home: Gilford Rile - 2 wheeled   OCCUPATION:  Retired   PLOF: Independent   PATIENT GOALS: Return to gardening, visiting grand kids     OBJECTIVE:    DIAGNOSTIC FINDINGS:  10/31/2021 review of FINDINGS: Changes of left knee replacement. No hardware or bony complicating feature. Soft tissue and joint space gas noted.   PATIENT SURVEYS:  11/20/2021  FOTO 31 (Goal 60)  10/31/2021 FOTO 38 (Goal 60 in 15 visits)   COGNITION: 10/31/2021 Overall cognitive status: Within functional limits for tasks assessed                         SENSATION: 10/31/2021 No complaints of peripheral  pain or paresthesias   EDEMA:  12/05/2021:   10/31/2021 Noted but not objectively assessed     LOWER EXTREMITY ROM:   Active ROM Right 10/31/2021 Left 10/31/2021 Left 11/01/2021 Left 11/13/2021 Left 11/20/2021 Left 11/25/21 Left 11/27/2021 Left 12/05/2021 Left 12/12/21  Knee flexion 112 40 50 58 62 62 73 PROM 65 AROM in supine heel slide (68 PROM) AA 96 Active 91 (Supine heel slide)  Knee extension 0 -3 -3 -3 -2 -3 (LAQ) -2 PROM  -12 AROM in seated LAQ -5 (seated LAQ)  BOLD MEASUREMENTS POST MANIP  (Blank rows = not tested)   LOWER EXTREMITY MMT:   MMT Right 10/31/2021 Left 10/31/2021 Left 12/05/2021  Knee flexion       Knee extension 4/5 2/5 3+/5  BOLD MEASUREMENTS POST MANIP   (Blank rows = not tested)   GAIT: 12/05/2021 : FWW use mod independence  10/31/2021 Distance walked: 50 feet Assistive device utilized: Environmental consultant - 2 wheeled Level of assistance:  Needs walker to reduce L knee WB Comments: Discussed the need for continued AD use to manage edema    TODAY'S TREATMENT  12/13/21 TherEx Nustep lvl 4, 7 min Leg Press bil 75#  2x10; LLE only 37# 2x10  TRX squats x15 with cues to sit back, and feet shoulder width apart.  LAQ 5# 2x10 on Lt; 5 sec hold AA knee flexion 10 x 10 sec hold with RLE providing overpressure AA heel slides x 10 reps ROM measurements as noted above  Vasopneumatic Lt knee in elevation 34 deg med compression x 10 min DATE:  12/12/21 TherEx SciFit bike seat 10; partial revolutions x 8 min Leg Press bil 75#  2x10; LLE only 37# 2x10  LAQ 5# 2x10 on Lt; 5 sec hold AA knee flexion 10 x 10 sec hold with RLE providing overpressure AA heel slides x 10 reps ROM measurements as noted above  Vasopneumatic Lt knee in elevation 34 deg med compression x 10 min   12/11/21 TherEx Nustep lvl 4, 8 min Tailgate knee flexion x 2 min AA knee flexion with percussive device 10 x 10 sec hold Seated LAQ with 4# LLE 2x10; 5 sec hold Sit to/from stand 2  x 10 reps without UE support; elevated mat table Leg Press 68# 2x10; then LLE only 25# 2x10  Manual Seated Lt knee flexion with percussive device on quad; utilizing contract relax                                                                          DATE:  12/10/21 TherEx SciFit bike  seat 10; partial revolutions x 8 min Tailgate knee flexion x 3 min AA knee flexion with percussive device 10 x 10 sec hold Seated LAQ with 4# LLE 2x10; 5 sec hold Sit to/from stand 2 x 10 reps without UE support; elevated mat table. L LE kicked out to place more emphasis on L LE.  Leg Press 68# 2x10; then LLE only 25# 2x10   Manual Seated Lt knee flexion with percussive device on quad; utilizing contract relax   12/09/2021 Therex: Nustep Lvl 5 5 mins UE/LE  Seated Lt knee AROM c pause in end ranges x 10 c contralateral leg   LAQ with leg weight focusing on quad contraction and eccentric control 2x10  Gait with stand alone cane 116f with CGA.   Manual: Seated Lt knee flexion c distraction/IR mobilization while pt performs theragun to thigh. Improvements noted with continued duration.  STM to quad.   Vasopneumatic Lt knee in elevation 34 deg high compression  12/05/2021 Therex: Nustep Lvl 5 10 mins UE/LE  Seated Lt knee AROM c pause in end ranges x 15 c contralateral leg movement opposite, x 15 after manual Supine LAQ in elevation for swelling control x 10 (cues for home use)  Manual: Seated Lt knee flexion c distraction/IR mobilization c movement c contract/relax holds into flexion.   Vasopneumatic Lt knee in elevation 34 deg high compression  PATIENT EDUCATION:  11/04/2021 Education details: HEP update Person educated: Patient Education method: Explanation, Demonstration, Tactile cues, Verbal cues, and Handouts Education comprehension: verbalized understanding, returned demonstration, verbal cues required, tactile cues required   HOME EXERCISE PROGRAM: Access Code: [email or web address]:  [email or web address] Date: 11/04/2021 Prepared by: MScot Jun Exercises - Supine Quadricep Sets  - 5 x daily - 7 x weekly - 2 sets - 10 reps - 5 second hold - Seated Knee Flexion AAROM  - 5 x daily - 7 x weekly - 1 sets - 1 reps - 3 minutes hold - Seated Long Arc Quad (Mirrored)  - 3-5 x daily - 7 x weekly - 1-2 sets - 10 reps - 2 hold - Ankle Pumps in Elevation  - 2-3 x daily - 7 x weekly - 1-2 sets - 10 reps   ASSESSMENT:   CLINICAL IMPRESSION: Pt with good improvement in knee flexion today. Pt reaching 92 with AAROM and 95 passively in supine. She continues to progress well with functional strength training. She will benefit from continued PT to maximize function.   OBJECTIVE IMPAIRMENTS: Abnormal gait, decreased activity tolerance, decreased balance, decreased coordination, decreased endurance, decreased knowledge of condition, difficulty walking, decreased ROM, decreased strength, decreased safety awareness, increased edema, impaired perceived functional ability, obesity, and pain.    ACTIVITY LIMITATIONS: carrying, bending, sitting, standing, squatting, stairs, bed mobility, dressing, and locomotion level   PARTICIPATION LIMITATIONS: cleaning, driving, shopping, community activity, and yard work   PERSONAL FACTORS: OA, skin cancer, HTN, previous Rt knee scope, atrial fibrillation are also affecting patient's functional outcome.    REHAB POTENTIAL: Good   CLINICAL DECISION MAKING: Stable/uncomplicated   EVALUATION COMPLEXITY: Low     GOALS: Goals reviewed with patient? Yes    SHORT TERM GOALS: Target date: 11/28/2021  SBreannahwill improve Lt knee flexion AROM to 90 degrees Baseline: 40 degrees Goal status: NOT MET 12/03/21 (MET 12/12/21)   2.  SGeraldywill be independent and compliant 5/7 days with her starter HEP for 2 weeks Baseline:  Goal status: Met 11/13/2021     LONG TERM GOALS: Target date:  01/09/2022    Improve FOTO to 60 Baseline: 38 Goal  status: Ongoing 12/12/21   2.  Nichele will report Lt knee pain consistently 3/10 or better on the VAS Baseline: Can be 7/10 Goal status: On Going 12/12/2021   3.  Improve Lt knee AROM to 2-0-105 or better Baseline: 3-0-40 Goal status: Partially Met 12/12/2021   4.  Improve Lt knee strength as assessed by MMT, hand-held dynamometer and transition to a cane or no AD Baseline: Walker and poor (2/5 MMT) strength Goal status: On Going 12/12/2021   5.  Louellen will be compliant and independent with her long-term HEP at DC Baseline: Started 10/31/2021 Goal status: On Going 12/12/2021     PLAN:   PT FREQUENCY:  2-3x/week   PT DURATION: 10 weeks   PLANNED INTERVENTIONS: Therapeutic exercises, Therapeutic activity, Neuromuscular re-education, Balance training, Gait training, Patient/Family education, Self Care, Joint mobilization, Stair training, Electrical stimulation, Cryotherapy, Vasopneumatic device, and Manual therapy   PLAN FOR NEXT SESSION:  manual for flexion, functional strength     Rudi Heap PT, DPT 12/13/21  11:37 AM

## 2021-12-12 NOTE — Addendum Note (Signed)
Addended by: Lynden Ang on: 12/12/2021 02:16 PM   Modules accepted: Orders

## 2021-12-13 ENCOUNTER — Encounter: Payer: Self-pay | Admitting: Physical Therapy

## 2021-12-13 ENCOUNTER — Ambulatory Visit (INDEPENDENT_AMBULATORY_CARE_PROVIDER_SITE_OTHER): Payer: Medicare Other | Admitting: Physical Therapy

## 2021-12-13 DIAGNOSIS — R600 Localized edema: Secondary | ICD-10-CM

## 2021-12-13 DIAGNOSIS — M6281 Muscle weakness (generalized): Secondary | ICD-10-CM | POA: Diagnosis not present

## 2021-12-13 DIAGNOSIS — M25662 Stiffness of left knee, not elsewhere classified: Secondary | ICD-10-CM

## 2021-12-13 DIAGNOSIS — M25562 Pain in left knee: Secondary | ICD-10-CM | POA: Diagnosis not present

## 2021-12-13 DIAGNOSIS — R262 Difficulty in walking, not elsewhere classified: Secondary | ICD-10-CM

## 2021-12-16 ENCOUNTER — Ambulatory Visit (INDEPENDENT_AMBULATORY_CARE_PROVIDER_SITE_OTHER): Payer: Medicare Other | Admitting: Physical Therapy

## 2021-12-16 ENCOUNTER — Encounter: Payer: Self-pay | Admitting: Physical Therapy

## 2021-12-16 DIAGNOSIS — R262 Difficulty in walking, not elsewhere classified: Secondary | ICD-10-CM | POA: Diagnosis not present

## 2021-12-16 DIAGNOSIS — R600 Localized edema: Secondary | ICD-10-CM

## 2021-12-16 DIAGNOSIS — M6281 Muscle weakness (generalized): Secondary | ICD-10-CM

## 2021-12-16 DIAGNOSIS — M25662 Stiffness of left knee, not elsewhere classified: Secondary | ICD-10-CM

## 2021-12-16 NOTE — Therapy (Signed)
OUTPATIENT PHYSICAL THERAPY TREATMENT      Patient Name: Bridget [name] MRN: [id number] DOB:1945-02-07, 76 y.o., female Today's Date: 12/16/2021   END OF SESSION:   PT End of Session - 12/16/21 1350     Visit Number 20    Number of Visits 36    Date for PT Re-Evaluation 01/20/22    Progress Note Due on Visit 28    PT Start Time 4801    PT Stop Time 1432    PT Time Calculation (min) 47 min    Activity Tolerance Patient tolerated treatment well    Behavior During Therapy WFL for tasks assessed/performed                     Past Medical History:  Diagnosis Date   Arthritis    Cancer ([place])    basal cell cancer removed on face   Hypertension    Seasonal allergies    Sleep apnea    Past Surgical History:  Procedure Laterality Date   BREAST EXCISIONAL BIOPSY Left    CHOLECYSTECTOMY  02/2010   KNEE SURGERY Right    Meniscal tear   OOPHORECTOMY  01/2003   RSO AT [facility]   TOTAL KNEE ARTHROPLASTY Left 10/18/2021   Procedure: LEFT TOTAL KNEE ARTHROPLASTY;  Surgeon: Mcarthur Rossetti, MD;  Location: [facility];  Service: Orthopedics;  Laterality: Left;   VAGINAL HYSTERECTOMY  01/2003   TVH,RSO A&P REPAIR   WISDOM TOOTH EXTRACTION     Patient Active Problem List   Diagnosis Date Noted   Status post total left knee replacement 10/18/2021   Degenerative arthritis of left knee 11/24/2019   Secondary hypercoagulable state ([place]) 01/26/2019   Greater trochanteric bursitis of right hip 09/23/2018   Lumbar radiculopathy 08/12/2018   Gluteal tendonitis of right buttock 07/29/2018   Atrial fibrillation ([place]) 07/21/2018   Arthritis of left acromioclavicular joint 04/26/2018   Partial nontraumatic tear of left rotator cuff 04/26/2018   Hypertension    Seasonal allergies      THERAPY DIAG:  Muscle weakness (generalized)  Localized edema  Difficulty in walking, not elsewhere classified  Stiffness of left knee, not elsewhere classified  PCP: Jonathon Jordan,  MD   REFERRING PROVIDER: Mcarthur Rossetti, MD   REFERRING DIAG: [phone] (ICD-10-CM) - Unilateral primary osteoarthritis, left knee    EVAL THERAPY DIAG:  Difficulty in walking, not elsewhere classified   Localized edema   Muscle weakness (generalized)   Stiffness of left knee, not elsewhere classified   Left knee pain, unspecified chronicity   Rationale for Evaluation and Treatment Rehabilitation   ONSET DATE: Surgery 10/18/2021   SUBJECTIVE:    SUBJECTIVE STATEMENT: Knee is still stiff; but otherwise no pain  PERTINENT HISTORY: OA, skin cancer, HTN, previous Rt knee scope, atrial fibrillation  PAIN:  NPRS scale: 3/10 Pain location: Lt knee Pain description: tightness, throbbing, aching  Aggravating factors: standing, colder weather  Relieving factors: ice, elevation    PRECAUTIONS: None   WEIGHT BEARING RESTRICTIONS: No   FALLS:  Has patient fallen in last 6 months? No   LIVING ENVIRONMENT: Lives with: lives with their spouse Lives in: House/apartment Stairs: 1 stair Has following equipment at home: Environmental consultant - 2 wheeled   OCCUPATION: Retired   PLOF: Independent   PATIENT GOALS: Return to gardening, visiting grand kids     OBJECTIVE:    DIAGNOSTIC FINDINGS:  10/31/2021 review of FINDINGS: Changes of left knee replacement. No hardware or bony complicating feature. Soft  tissue and joint space gas noted.   PATIENT SURVEYS:  11/20/2021  FOTO 31 (Goal 60)  10/31/2021 FOTO 38 (Goal 60 in 15 visits)   COGNITION: 10/31/2021 Overall cognitive status: Within functional limits for tasks assessed                         SENSATION: 10/31/2021 No complaints of peripheral pain or paresthesias   EDEMA:  12/05/2021:   10/31/2021 Noted but not objectively assessed     LOWER EXTREMITY ROM:   Active ROM Right 10/31/2021 Left 10/31/2021 Left 11/01/2021 Left 11/13/2021 Left 11/20/2021 Left 11/25/21 Left 11/27/2021 Left 12/05/2021 Left 12/12/21   Knee flexion 112 40 50 58 62 62 73 PROM 65 AROM in supine heel slide (68 PROM) AA 96 Active 91 (Supine heel slide)  Knee extension 0 -3 -3 -3 -2 -3 (LAQ) -2 PROM  -12 AROM in seated LAQ -5 (seated LAQ)  BOLD MEASUREMENTS POST MANIP  (Blank rows = not tested)   LOWER EXTREMITY MMT:   MMT Right 10/31/2021 Left 10/31/2021 Left 12/05/2021  Knee flexion       Knee extension 4/5 2/5 3+/5  BOLD MEASUREMENTS POST MANIP   (Blank rows = not tested)   GAIT: 12/05/2021 : FWW use mod independence  10/31/2021 Distance walked: 50 feet Assistive device utilized: Environmental consultant - 2 wheeled Level of assistance:  Needs walker to reduce L knee WB Comments: Discussed the need for continued AD use to manage edema    TODAY'S TREATMENT  12/16/21 TherEx Recumbent bike x 8 min; partial revolutions at seat 7 TRX squats 2x10; min cues for technique Leg Press bil 75# 3x10; then LLE only 37# 3x10 LAQ LLE only 5# 3x10; 5 sec hold  12/13/21 TherEx Nustep lvl 4, 7 min Leg Press bil 75#  2x10; LLE only 37# 2x10  TRX squats x15 with cues to sit back, and feet shoulder width apart.  LAQ 5# 2x10 on Lt; 5 sec hold AA knee flexion 10 x 10 sec hold with RLE providing overpressure AA heel slides x 10 reps ROM measurements as noted above  Vasopneumatic Lt knee in elevation 34 deg med compression x 10 min DATE:  12/12/21 TherEx SciFit bike seat 10; partial revolutions x 8 min Leg Press bil 75#  2x10; LLE only 37# 2x10  LAQ 5# 2x10 on Lt; 5 sec hold AA knee flexion 10 x 10 sec hold with RLE providing overpressure AA heel slides x 10 reps ROM measurements as noted above  Vasopneumatic Lt knee in elevation 34 deg med compression x 10 min   12/11/21 TherEx Nustep lvl 4, 8 min [place] knee flexion x 2 min AA knee flexion with percussive device 10 x 10 sec hold Seated LAQ with 4# LLE 2x10; 5 sec hold Sit to/from stand 2 x 10 reps without UE support; elevated mat table Leg Press 68# 2x10; then LLE only  25# 2x10  Manual Seated Lt knee flexion with percussive device on quad; utilizing contract relax                                                                          DATE:  12/10/21 TherEx SciFit bike seat 10; partial revolutions  x 8 min [place] knee flexion x 3 min AA knee flexion with percussive device 10 x 10 sec hold Seated LAQ with 4# LLE 2x10; 5 sec hold Sit to/from stand 2 x 10 reps without UE support; elevated mat table. L LE kicked out to place more emphasis on L LE.  Leg Press 68# 2x10; then LLE only 25# 2x10   Manual Seated Lt knee flexion with percussive device on quad; utilizing contract relax   12/09/2021 Therex: Nustep Lvl 5 5 mins UE/LE  Seated Lt knee AROM c pause in end ranges x 10 c contralateral leg   LAQ with leg weight focusing on quad contraction and eccentric control 2x10  Gait with stand alone cane 170f with CGA.   Manual: Seated Lt knee flexion c distraction/IR mobilization while pt performs theragun to thigh. Improvements noted with continued duration.  STM to quad.   Vasopneumatic Lt knee in elevation 34 deg high compression  12/05/2021 Therex: Nustep Lvl 5 10 mins UE/LE  Seated Lt knee AROM c pause in end ranges x 15 c contralateral leg movement opposite, x 15 after manual Supine LAQ in elevation for swelling control x 10 (cues for home use)  Manual: Seated Lt knee flexion c distraction/IR mobilization c movement c contract/relax holds into flexion.   Vasopneumatic Lt knee in elevation 34 deg high compression  PATIENT EDUCATION:  11/04/2021 Education details: HEP update Person educated: Patient Education method: Explanation, Demonstration, Tactile cues, Verbal cues, and Handouts Education comprehension: verbalized understanding, returned demonstration, verbal cues required, tactile cues required   HOME EXERCISE PROGRAM: Access Code: [email or web address]: [email or web address] Date: 11/04/2021 Prepared by: MScot Jun Exercises - Supine Quadricep Sets  - 5 x daily - 7 x weekly - 2 sets - 10 reps - 5 second hold - Seated Knee Flexion AAROM  - 5 x daily - 7 x weekly - 1 sets - 1 reps - 3 minutes hold - Seated Long Arc Quad (Mirrored)  - 3-5 x daily - 7 x weekly - 1-2 sets - 10 reps - 2 hold - Ankle Pumps in Elevation  - 2-3 x daily - 7 x weekly - 1-2 sets - 10 reps   ASSESSMENT:   CLINICAL IMPRESSION: Pt tolerated session well today with continued focus on strengthening and maximizing ROM.  Will continue to benefit from PT to maximize function.   OBJECTIVE IMPAIRMENTS: Abnormal gait, decreased activity tolerance, decreased balance, decreased coordination, decreased endurance, decreased knowledge of condition, difficulty walking, decreased ROM, decreased strength, decreased safety awareness, increased edema, impaired perceived functional ability, obesity, and pain.    ACTIVITY LIMITATIONS: carrying, bending, sitting, standing, squatting, stairs, bed mobility, dressing, and locomotion level   PARTICIPATION LIMITATIONS: cleaning, driving, shopping, community activity, and yard work   PERSONAL FACTORS: OA, skin cancer, HTN, previous Rt knee scope, atrial fibrillation are also affecting patient's functional outcome.    REHAB POTENTIAL: Good   CLINICAL DECISION MAKING: Stable/uncomplicated   EVALUATION COMPLEXITY: Low     GOALS: Goals reviewed with patient? Yes    SHORT TERM GOALS: Target date: 11/28/2021  SAmeliewill improve Lt knee flexion AROM to 90 degrees Baseline: 40 degrees Goal status: NOT MET 12/03/21 (MET 12/12/21)   2.  SAsiahwill be independent and compliant 5/7 days with her starter HEP for 2 weeks Baseline:  Goal status: Met 11/13/2021     LONG TERM GOALS: Target date: 01/09/2022    Improve FOTO to 60 Baseline: 38 Goal status: Ongoing 12/12/21   2.  Corine will report Lt knee pain consistently 3/10 or better on the VAS Baseline: Can be 7/10 Goal status: On Going  12/12/2021   3.  Improve Lt knee AROM to 2-0-105 or better Baseline: 3-0-40 Goal status: Partially Met 12/12/2021   4.  Improve Lt knee strength as assessed by MMT, hand-held dynamometer and transition to a cane or no AD Baseline: Walker and poor (2/5 MMT) strength Goal status: On Going 12/12/2021   5.  Zoriah will be compliant and independent with her long-term HEP at DC Baseline: Started 10/31/2021 Goal status: On Going 12/12/2021     PLAN:   PT FREQUENCY:  2-3x/week   PT DURATION: 10 weeks   PLANNED INTERVENTIONS: Therapeutic exercises, Therapeutic activity, Neuromuscular re-education, Balance training, Gait training, Patient/Family education, Self Care, Joint mobilization, Stair training, Electrical stimulation, Cryotherapy, Vasopneumatic device, and Manual therapy   PLAN FOR NEXT SESSION:  manual for flexion, functional strength     Laureen Abrahams, PT, DPT 12/16/21 2:41 PM

## 2021-12-17 ENCOUNTER — Encounter: Payer: Self-pay | Admitting: Physical Therapy

## 2021-12-17 ENCOUNTER — Ambulatory Visit (INDEPENDENT_AMBULATORY_CARE_PROVIDER_SITE_OTHER): Payer: Medicare Other | Admitting: Physical Therapy

## 2021-12-17 DIAGNOSIS — R262 Difficulty in walking, not elsewhere classified: Secondary | ICD-10-CM

## 2021-12-17 DIAGNOSIS — M25562 Pain in left knee: Secondary | ICD-10-CM

## 2021-12-17 DIAGNOSIS — R600 Localized edema: Secondary | ICD-10-CM | POA: Diagnosis not present

## 2021-12-17 DIAGNOSIS — M6281 Muscle weakness (generalized): Secondary | ICD-10-CM

## 2021-12-17 DIAGNOSIS — M25662 Stiffness of left knee, not elsewhere classified: Secondary | ICD-10-CM | POA: Diagnosis not present

## 2021-12-17 NOTE — Therapy (Signed)
OUTPATIENT PHYSICAL THERAPY TREATMENT      Patient Name: Bridget [name] MRN: [id number] DOB:03/22/1945, 76 y.o., female Today's Date: 12/17/2021   END OF SESSION:   PT End of Session - 12/17/21 1254     Visit Number 21    Number of Visits 36    Date for PT Re-Evaluation 01/20/22    Progress Note Due on Visit 28    PT Start Time 1300    PT Stop Time 1342    PT Time Calculation (min) 42 min    Activity Tolerance Patient tolerated treatment well    Behavior During Therapy WFL for tasks assessed/performed                      Past Medical History:  Diagnosis Date   Arthritis    Cancer ([place])    basal cell cancer removed on face   Hypertension    Seasonal allergies    Sleep apnea    Past Surgical History:  Procedure Laterality Date   BREAST EXCISIONAL BIOPSY Left    CHOLECYSTECTOMY  02/2010   KNEE SURGERY Right    Meniscal tear   OOPHORECTOMY  01/2003   RSO AT [facility] [place]   TOTAL KNEE ARTHROPLASTY Left 10/18/2021   Procedure: LEFT TOTAL KNEE ARTHROPLASTY;  Surgeon: Mcarthur Rossetti, MD;  Location: [facility];  Service: Orthopedics;  Laterality: Left;   VAGINAL HYSTERECTOMY  01/2003   TVH,RSO A&P REPAIR   WISDOM TOOTH EXTRACTION     Patient Active Problem List   Diagnosis Date Noted   Status post total left knee replacement 10/18/2021   Degenerative arthritis of left knee 11/24/2019   Secondary hypercoagulable state ([place]) 01/26/2019   Greater trochanteric bursitis of right hip 09/23/2018   Lumbar radiculopathy 08/12/2018   Gluteal tendonitis of right buttock 07/29/2018   Atrial fibrillation ([place]) 07/21/2018   Arthritis of left acromioclavicular joint 04/26/2018   Partial nontraumatic tear of left rotator cuff 04/26/2018   Hypertension    Seasonal allergies      THERAPY DIAG:  Muscle weakness (generalized)  Localized edema  Difficulty in walking, not elsewhere classified  Stiffness of left knee, not elsewhere classified  Left knee pain,  unspecified chronicity  PCP: Jonathon Jordan, MD   REFERRING PROVIDER: Mcarthur Rossetti, MD   REFERRING DIAG: [phone] (ICD-10-CM) - Unilateral primary osteoarthritis, left knee    EVAL THERAPY DIAG:  Difficulty in walking, not elsewhere classified   Localized edema   Muscle weakness (generalized)   Stiffness of left knee, not elsewhere classified   Left knee pain, unspecified chronicity   Rationale for Evaluation and Treatment Rehabilitation   ONSET DATE: Surgery 10/18/2021   SUBJECTIVE:    SUBJECTIVE STATEMENT: Has a sore spot on the medial knee that feels like a "knot"   PERTINENT HISTORY: OA, skin cancer, HTN, previous Rt knee scope, atrial fibrillation  PAIN:  NPRS scale: 0 at rest; 2-3 with activity/10 Pain location: Lt knee Pain description: tightness, throbbing, aching  Aggravating factors: standing, colder weather  Relieving factors: ice, elevation    PRECAUTIONS: None   WEIGHT BEARING RESTRICTIONS: No   FALLS:  Has patient fallen in last 6 months? No   LIVING ENVIRONMENT: Lives with: lives with their spouse Lives in: House/apartment Stairs: 1 stair Has following equipment at home: Environmental consultant - 2 wheeled   OCCUPATION: Retired   PLOF: Independent   PATIENT GOALS: Return to gardening, visiting grand kids     OBJECTIVE:    DIAGNOSTIC  FINDINGS:  10/31/2021 review of FINDINGS: Changes of left knee replacement. No hardware or bony complicating feature. Soft tissue and joint space gas noted.   PATIENT SURVEYS:  11/20/2021  FOTO 31 (Goal 60)  10/31/2021 FOTO 38 (Goal 60 in 15 visits)   COGNITION: 10/31/2021 Overall cognitive status: Within functional limits for tasks assessed                         SENSATION: 10/31/2021 No complaints of peripheral pain or paresthesias   EDEMA:  12/05/2021:   10/31/2021 Noted but not objectively assessed     LOWER EXTREMITY ROM:   Active ROM Right 10/31/2021 Left 10/31/2021 Left 11/01/2021  Left 11/13/2021 Left 11/20/2021 Left 11/25/21 Left 11/27/2021 Left 12/05/2021 Left 12/12/21 Left 12/17/21  Knee flexion 112 40 50 58 62 62 73 PROM 65 AROM in supine heel slide (68 PROM) AA 96 Active 91 (Supine heel slide) Active 91 AA 96 (Supine heel slide)   Knee extension 0 -3 -3 -3 -2 -3 (LAQ) -2 PROM  -12 AROM in seated LAQ -5 (seated LAQ) -1 (seated LAQ)  BOLD MEASUREMENTS POST MANIP  (Blank rows = not tested)   LOWER EXTREMITY MMT:   MMT Right 10/31/2021 Left 10/31/2021 Left 12/05/2021  Knee flexion       Knee extension 4/5 2/5 3+/5  BOLD MEASUREMENTS POST MANIP   (Blank rows = not tested)   GAIT: 12/05/2021 : FWW use mod independence  10/31/2021 Distance walked: 50 feet Assistive device utilized: Environmental consultant - 2 wheeled Level of assistance:  Needs walker to reduce L knee WB Comments: Discussed the need for continued AD use to manage edema    TODAY'S TREATMENT  12/17/21 TherEx SciFit bike seat 9; partial revolutions x 8 min Lt LAQ 5# 3x10 LLLD stretch heel slide with strap x 5 min working on incrementally increasing knee flexion ROM measurements as noted above Supine hamstring stretch 3x20 sec hold on Lt with strap and overpressure at distal thigh  12/16/21 TherEx Recumbent bike x 8 min; partial revolutions at seat 7 TRX squats 2x10; min cues for technique Leg Press bil 75# 3x10; then LLE only 37# 3x10 LAQ LLE only 5# 3x10; 5 sec hold  12/13/21 TherEx Nustep lvl 4, 7 min Leg Press bil 75#  2x10; LLE only 37# 2x10  TRX squats x15 with cues to sit back, and feet shoulder width apart.  LAQ 5# 2x10 on Lt; 5 sec hold AA knee flexion 10 x 10 sec hold with RLE providing overpressure AA heel slides x 10 reps ROM measurements as noted above  Vasopneumatic Lt knee in elevation 34 deg med compression x 10 min DATE:  12/12/21 TherEx SciFit bike seat 10; partial revolutions x 8 min Leg Press bil 75#  2x10; LLE only 37# 2x10  LAQ 5# 2x10 on Lt; 5 sec hold AA knee  flexion 10 x 10 sec hold with RLE providing overpressure AA heel slides x 10 reps ROM measurements as noted above  Vasopneumatic Lt knee in elevation 34 deg med compression x 10 min   PATIENT EDUCATION:  11/04/2021 Education details: HEP update Person educated: Patient Education method: Explanation, Demonstration, Tactile cues, Verbal cues, and Handouts Education comprehension: verbalized understanding, returned demonstration, verbal cues required, tactile cues required   HOME EXERCISE PROGRAM: Access Code: [email or web address] URL: [email or web address] Date: 11/04/2021 Prepared by: Scot Jun  Exercises - Supine Quadricep Sets  - 5 x daily - 7 x weekly - 2 sets - 10 reps -  5 second hold - Seated Knee Flexion AAROM  - 5 x daily - 7 x weekly - 1 sets - 1 reps - 3 minutes hold - Seated Long Arc Quad (Mirrored)  - 3-5 x daily - 7 x weekly - 1-2 sets - 10 reps - 2 hold - Ankle Pumps in Elevation  - 2-3 x daily - 7 x weekly - 1-2 sets - 10 reps   ASSESSMENT:   CLINICAL IMPRESSION: Pt very tearful today and frustrated with progress at this time.  Continue to recommend thigh his compression stockings to help with edema.  Pt to see MD after tomorrow's PT appt.   OBJECTIVE IMPAIRMENTS: Abnormal gait, decreased activity tolerance, decreased balance, decreased coordination, decreased endurance, decreased knowledge of condition, difficulty walking, decreased ROM, decreased strength, decreased safety awareness, increased edema, impaired perceived functional ability, obesity, and pain.    ACTIVITY LIMITATIONS: carrying, bending, sitting, standing, squatting, stairs, bed mobility, dressing, and locomotion level   PARTICIPATION LIMITATIONS: cleaning, driving, shopping, community activity, and yard work   PERSONAL FACTORS: OA, skin cancer, HTN, previous Rt knee scope, atrial fibrillation are also affecting patient's functional outcome.    REHAB POTENTIAL: Good   CLINICAL DECISION  MAKING: Stable/uncomplicated   EVALUATION COMPLEXITY: Low     GOALS: Goals reviewed with patient? Yes    SHORT TERM GOALS: Target date: 11/28/2021  Bridget [name] will improve Lt knee flexion AROM to 90 degrees Baseline: 40 degrees Goal status: NOT MET 12/03/21 (MET 12/12/21)   2.  Bridget [name] will be independent and compliant 5/7 days with her starter HEP for 2 weeks Baseline:  Goal status: Met 11/13/2021     LONG TERM GOALS: Target date: 01/09/2022    Improve FOTO to 60 Baseline: 38 Goal status: Ongoing 12/12/21   2.  Bridget [name] will report Lt knee pain consistently 3/10 or better on the VAS Baseline: Can be 7/10 Goal status: On Going 12/12/2021   3.  Improve Lt knee AROM to 2-0-105 or better Baseline: 3-0-40 Goal status: Partially Met 12/12/2021   4.  Improve Lt knee strength as assessed by MMT, hand-held dynamometer and transition to a cane or no AD Baseline: Walker and poor (2/5 MMT) strength Goal status: On Going 12/12/2021   5.  Bridget [name] will be compliant and independent with her long-term HEP at DC Baseline: Started 10/31/2021 Goal status: On Going 12/12/2021     PLAN:   PT FREQUENCY:  2-3x/week   PT DURATION: 10 weeks   PLANNED INTERVENTIONS: Therapeutic exercises, Therapeutic activity, Neuromuscular re-education, Balance training, Gait training, Patient/Family education, Self Care, Joint mobilization, Stair training, Electrical stimulation, Cryotherapy, Vasopneumatic device, and Manual therapy   PLAN FOR NEXT SESSION:  MD note, manual for flexion, functional strength     Laureen Abrahams, PT, DPT 12/17/21 1:45 PM

## 2021-12-18 ENCOUNTER — Ambulatory Visit (INDEPENDENT_AMBULATORY_CARE_PROVIDER_SITE_OTHER): Payer: Medicare Other

## 2021-12-18 ENCOUNTER — Ambulatory Visit (INDEPENDENT_AMBULATORY_CARE_PROVIDER_SITE_OTHER): Payer: Medicare Other | Admitting: Physical Therapy

## 2021-12-18 ENCOUNTER — Encounter: Payer: Self-pay | Admitting: Physical Therapy

## 2021-12-18 ENCOUNTER — Ambulatory Visit (INDEPENDENT_AMBULATORY_CARE_PROVIDER_SITE_OTHER): Payer: Medicare Other | Admitting: Orthopaedic Surgery

## 2021-12-18 ENCOUNTER — Encounter: Payer: Self-pay | Admitting: Orthopaedic Surgery

## 2021-12-18 DIAGNOSIS — M6281 Muscle weakness (generalized): Secondary | ICD-10-CM | POA: Diagnosis not present

## 2021-12-18 DIAGNOSIS — M25662 Stiffness of left knee, not elsewhere classified: Secondary | ICD-10-CM

## 2021-12-18 DIAGNOSIS — R262 Difficulty in walking, not elsewhere classified: Secondary | ICD-10-CM

## 2021-12-18 DIAGNOSIS — R600 Localized edema: Secondary | ICD-10-CM

## 2021-12-18 DIAGNOSIS — Z96652 Presence of left artificial knee joint: Secondary | ICD-10-CM

## 2021-12-18 DIAGNOSIS — M24662 Ankylosis, left knee: Secondary | ICD-10-CM

## 2021-12-18 DIAGNOSIS — M25562 Pain in left knee: Secondary | ICD-10-CM

## 2021-12-18 NOTE — Progress Notes (Signed)
The patient is now over 2 months out from a left total knee arthroplasty.  Her postoperative course was complicated by knee pain causing arthrofibrosis.  We did take her to the operating room 2 weeks ago for a manipulation under anesthesia of her left total knee.  We were able to get her flexing well and get her into physical therapy soon after.  This is her first visit since then. She does report that there is some fullness on the medial aspect of her distal thigh and she is worried about this being a cyst.  On exam her incisions well-healed.  She still has swelling in the knee to be expected.  She does a large soft tissue envelope around her knee.  I cannot feel a specific cyst but she is definitely tender along the medial femoral condyle and medial collateral ligament area.  Her incision looks good.  The knee extension is almost full and I can flex her past 90 degrees today and probably to the posterior even harder and further.  I gave her reassurance that she is doing well.  X-rays of the left knee show well-seated total knee arthroplasty with no complicating features.  She is definitely benefiting physical therapy.  I want her to try Voltaren gel on the medial and lateral aspects of her left knee at least twice daily over the next 4 weeks as well as trying heat over the area of her medial femoral condyle and medial distal thigh where she is hurting.  Will see her back in 4 weeks to see how she is doing overall but no x-rays are needed.

## 2021-12-18 NOTE — Therapy (Signed)
OUTPATIENT PHYSICAL THERAPY TREATMENT      Patient Name: Bridget [name] MRN: [id number] DOB:1945/04/29, 76 y.o., female Today's Date: 12/18/2021   END OF SESSION:   PT End of Session - 12/18/21 1303     Visit Number 22    Number of Visits 36    Date for PT Re-Evaluation 01/20/22    Progress Note Due on Visit 28    PT Start Time 1259    PT Stop Time 1339    PT Time Calculation (min) 40 min    Activity Tolerance Patient tolerated treatment well    Behavior During Therapy [facility] for tasks assessed/performed                       Past Medical History:  Diagnosis Date   Arthritis    Cancer ([place])    basal cell cancer removed on face   Hypertension    Seasonal allergies    Sleep apnea    Past Surgical History:  Procedure Laterality Date   BREAST EXCISIONAL BIOPSY Left    CHOLECYSTECTOMY  02/2010   KNEE SURGERY Right    Meniscal tear   OOPHORECTOMY  01/2003   RSO AT [facility]   TOTAL KNEE ARTHROPLASTY Left 10/18/2021   Procedure: LEFT TOTAL KNEE ARTHROPLASTY;  Surgeon: Mcarthur Rossetti, MD;  Location: [facility];  Service: Orthopedics;  Laterality: Left;   VAGINAL HYSTERECTOMY  01/2003   [facility],RSO A&P REPAIR   WISDOM TOOTH EXTRACTION     Patient Active Problem List   Diagnosis Date Noted   Status post total left knee replacement 10/18/2021   Degenerative arthritis of left knee 11/24/2019   Secondary hypercoagulable state ([place]) 01/26/2019   Greater trochanteric bursitis of right hip 09/23/2018   Lumbar radiculopathy 08/12/2018   Gluteal tendonitis of right buttock 07/29/2018   Atrial fibrillation ([place]) 07/21/2018   Arthritis of left acromioclavicular joint 04/26/2018   Partial nontraumatic tear of left rotator cuff 04/26/2018   Hypertension    Seasonal allergies      THERAPY DIAG:  Muscle weakness (generalized)  Localized edema  Difficulty in walking, not elsewhere classified  Stiffness of left knee, not elsewhere classified  Left knee pain,  unspecified chronicity  PCP: Jonathon Jordan, MD   REFERRING PROVIDER: Mcarthur Rossetti, MD   REFERRING DIAG: [phone] (ICD-10-CM) - Unilateral primary osteoarthritis, left knee    EVAL THERAPY DIAG:  Difficulty in walking, not elsewhere classified   Localized edema   Muscle weakness (generalized)   Stiffness of left knee, not elsewhere classified   Left knee pain, unspecified chronicity   Rationale for Evaluation and Treatment Rehabilitation   ONSET DATE: Surgery 10/18/2021   SUBJECTIVE:    SUBJECTIVE STATEMENT: Doing better today; hamstring stretches yesterday seemed to help   PERTINENT HISTORY: OA, skin cancer, HTN, previous Rt knee scope, atrial fibrillation  PAIN:  NPRS scale: 0 at rest; 2-3 with activity/10 Pain location: Lt knee Pain description: tightness, throbbing, aching  Aggravating factors: standing, colder weather  Relieving factors: ice, elevation    PRECAUTIONS: None   WEIGHT BEARING RESTRICTIONS: No   FALLS:  Has patient fallen in last 6 months? No   LIVING ENVIRONMENT: Lives with: lives with their spouse Lives in: House/apartment Stairs: 1 stair Has following equipment at home: Environmental consultant - 2 wheeled   OCCUPATION: Retired   PLOF: Independent   PATIENT GOALS: Return to gardening, visiting grand kids     OBJECTIVE:    DIAGNOSTIC FINDINGS:  10/31/2021  review of FINDINGS: Changes of left knee replacement. No hardware or bony complicating feature. Soft tissue and joint space gas noted.   PATIENT SURVEYS:  11/20/2021  FOTO 31 (Goal 60)  10/31/2021 FOTO 38 (Goal 60 in 15 visits)   COGNITION: 10/31/2021 Overall cognitive status: Within functional limits for tasks assessed                         SENSATION: 10/31/2021 No complaints of peripheral pain or paresthesias   EDEMA:  12/05/2021:   10/31/2021 Noted but not objectively assessed     LOWER EXTREMITY ROM:   Active ROM Right 10/31/2021 Left 10/31/2021 Left  11/01/2021 Left 11/13/2021 Left 11/20/2021 Left 11/25/21 Left 11/27/2021 Left 12/05/2021 Left 12/12/21 Left 12/17/21 Left 12/18/21  Knee flexion 112 40 50 58 62 62 73 PROM 65 AROM in supine heel slide (68 PROM) AA 96 Active 91 (Supine heel slide) Active 91 AA 96 (Supine heel slide)  A: 92 AA: 96  Knee extension 0 -3 -3 -3 -2 -3 (LAQ) -2 PROM  -12 AROM in seated LAQ -5 (seated LAQ) -1 (seated LAQ) -1 (seated LAQ)  BOLD MEASUREMENTS POST MANIP  (Blank rows = not tested)   LOWER EXTREMITY MMT:   MMT Right 10/31/2021 Left 10/31/2021 Left 12/05/2021  Knee flexion       Knee extension 4/5 2/5 3+/5  BOLD MEASUREMENTS POST MANIP   (Blank rows = not tested)   GAIT: 12/05/2021 : FWW use mod independence  10/31/2021 Distance walked: 50 feet Assistive device utilized: Environmental consultant - 2 wheeled Level of assistance:  Needs walker to reduce L knee WB Comments: Discussed the need for continued AD use to manage edema    TODAY'S TREATMENT  12/18/21 TherEx SciFit bike seat 9; full revolutions x 8 min AA heel slides on Lt with prolonged hold Leg Press 75# 2x10 bil; then 43# 2x10 Seated overpressure Lt knee flexion 10 x 10 sec hold; RLE providing overpressure  Manual Seated Lt knee flexion; able to get to ~95 deg in sitting; min cues to decrease hip hike   12/17/21 TherEx SciFit bike seat 9; partial revolutions x 8 min Lt LAQ 5# 3x10 LLLD stretch heel slide with strap x 5 min working on incrementally increasing knee flexion ROM measurements as noted above Supine hamstring stretch 3x20 sec hold on Lt with strap and overpressure at distal thigh  12/16/21 TherEx Recumbent bike x 8 min; partial revolutions at seat 7 TRX squats 2x10; min cues for technique Leg Press bil 75# 3x10; then LLE only 37# 3x10 LAQ LLE only 5# 3x10; 5 sec hold  12/13/21 TherEx Nustep lvl 4, 7 min Leg Press bil 75#  2x10; LLE only 37# 2x10  TRX squats x15 with cues to sit back, and feet shoulder width apart.   LAQ 5# 2x10 on Lt; 5 sec hold AA knee flexion 10 x 10 sec hold with RLE providing overpressure AA heel slides x 10 reps ROM measurements as noted above  Vasopneumatic Lt knee in elevation 34 deg med compression x 10 min DATE:  12/12/21 TherEx SciFit bike seat 10; partial revolutions x 8 min Leg Press bil 75#  2x10; LLE only 37# 2x10  LAQ 5# 2x10 on Lt; 5 sec hold AA knee flexion 10 x 10 sec hold with RLE providing overpressure AA heel slides x 10 reps ROM measurements as noted above  Vasopneumatic Lt knee in elevation 34 deg med compression x 10 min   PATIENT EDUCATION:  11/04/2021 Education details: HEP update Person educated: Patient Education method: Explanation, Demonstration, Corporate treasurer cues, Verbal cues, and Handouts Education comprehension: verbalized understanding, returned demonstration, verbal cues required, tactile cues required   HOME EXERCISE PROGRAM: Access Code: [email or web address] URL: [email or web address] Date: 11/04/2021 Prepared by: Scot Jun  Exercises - Supine Quadricep Sets  - 5 x daily - 7 x weekly - 2 sets - 10 reps - 5 second hold - Seated Knee Flexion AAROM  - 5 x daily - 7 x weekly - 1 sets - 1 reps - 3 minutes hold - Seated Long Arc Quad (Mirrored)  - 3-5 x daily - 7 x weekly - 1-2 sets - 10 reps - 2 hold - Ankle Pumps in Elevation  - 2-3 x daily - 7 x weekly - 1-2 sets - 10 reps   ASSESSMENT:   CLINICAL IMPRESSION: Pt tolerated session better today, and motion largely limited by swelling at this time.  Pt to see MD after this appt.  Will continue to benefit from PT to maximize function.   OBJECTIVE IMPAIRMENTS: Abnormal gait, decreased activity tolerance, decreased balance, decreased coordination, decreased endurance, decreased knowledge of condition, difficulty walking, decreased ROM, decreased strength, decreased safety awareness, increased edema, impaired perceived functional ability, obesity, and pain.    ACTIVITY LIMITATIONS:  carrying, bending, sitting, standing, squatting, stairs, bed mobility, dressing, and locomotion level   PARTICIPATION LIMITATIONS: cleaning, driving, shopping, community activity, and yard work   PERSONAL FACTORS: OA, skin cancer, HTN, previous Rt knee scope, atrial fibrillation are also affecting patient's functional outcome.    REHAB POTENTIAL: Good   CLINICAL DECISION MAKING: Stable/uncomplicated   EVALUATION COMPLEXITY: Low     GOALS: Goals reviewed with patient? Yes    SHORT TERM GOALS: Target date: 11/28/2021  Saphyra will improve Lt knee flexion AROM to 90 degrees Baseline: 40 degrees Goal status: NOT MET 12/03/21 (MET 12/12/21)   2.  Nataki will be independent and compliant 5/7 days with her starter HEP for 2 weeks Baseline:  Goal status: Met 11/13/2021     LONG TERM GOALS: Target date: 01/09/2022    Improve FOTO to 60 Baseline: 38 Goal status: Ongoing 12/12/21   2.  Gypsy will report Lt knee pain consistently 3/10 or better on the VAS Baseline: Can be 7/10 Goal status: On Going 12/12/2021   3.  Improve Lt knee AROM to 2-0-105 or better Baseline: 3-0-40 Goal status: Partially Met 12/12/2021   4.  Improve Lt knee strength as assessed by MMT, hand-held dynamometer and transition to a cane or no AD Baseline: Walker and poor (2/5 MMT) strength Goal status: On Going 12/12/2021   5.  Hannah will be compliant and independent with her long-term HEP at DC Baseline: Started 10/31/2021 Goal status: On Going 12/12/2021     PLAN:   PT FREQUENCY:  2-3x/week   PT DURATION: 10 weeks   PLANNED INTERVENTIONS: Therapeutic exercises, Therapeutic activity, Neuromuscular re-education, Balance training, Gait training, Patient/Family education, Self Care, Joint mobilization, Stair training, Electrical stimulation, Cryotherapy, Vasopneumatic device, and Manual therapy   PLAN FOR NEXT SESSION:  manual for flexion, functional strength     Laureen Abrahams, PT, DPT 12/18/21 1:42  PM

## 2021-12-19 ENCOUNTER — Ambulatory Visit (INDEPENDENT_AMBULATORY_CARE_PROVIDER_SITE_OTHER): Payer: Medicare Other | Admitting: Rehabilitative and Restorative Service Providers"

## 2021-12-19 ENCOUNTER — Encounter: Payer: Self-pay | Admitting: Rehabilitative and Restorative Service Providers"

## 2021-12-19 DIAGNOSIS — M6281 Muscle weakness (generalized): Secondary | ICD-10-CM | POA: Diagnosis not present

## 2021-12-19 DIAGNOSIS — M25562 Pain in left knee: Secondary | ICD-10-CM

## 2021-12-19 DIAGNOSIS — R600 Localized edema: Secondary | ICD-10-CM | POA: Diagnosis not present

## 2021-12-19 DIAGNOSIS — R262 Difficulty in walking, not elsewhere classified: Secondary | ICD-10-CM | POA: Diagnosis not present

## 2021-12-19 DIAGNOSIS — M25662 Stiffness of left knee, not elsewhere classified: Secondary | ICD-10-CM

## 2021-12-19 NOTE — Therapy (Signed)
OUTPATIENT PHYSICAL THERAPY TREATMENT   Patient Name: Bridget [name] MRN: [id number] DOB:October 05, 1945, 76 y.o., female Today's Date: 12/19/2021   END OF SESSION:   PT End of Session - 12/19/21 1353     Visit Number 23    Number of Visits 36    Date for PT Re-Evaluation 01/20/22    Progress Note Due on Visit 28    PT Start Time 2248    PT Stop Time 1440    PT Time Calculation (min) 53 min    Activity Tolerance Patient tolerated treatment well;Patient limited by pain;No increased pain    Behavior During Therapy WFL for tasks assessed/performed               Past Medical History:  Diagnosis Date   Arthritis    Cancer ([place])    basal cell cancer removed on face   Hypertension    Seasonal allergies    Sleep apnea    Past Surgical History:  Procedure Laterality Date   BREAST EXCISIONAL BIOPSY Left    CHOLECYSTECTOMY  02/2010   KNEE SURGERY Right    Meniscal tear   OOPHORECTOMY  01/2003   RSO AT [facility]   TOTAL KNEE ARTHROPLASTY Left 10/18/2021   Procedure: LEFT TOTAL KNEE ARTHROPLASTY;  Surgeon: Mcarthur Rossetti, MD;  Location: [facility];  Service: Orthopedics;  Laterality: Left;   VAGINAL HYSTERECTOMY  01/2003   TVH,RSO A&P REPAIR   WISDOM TOOTH EXTRACTION     Patient Active Problem List   Diagnosis Date Noted   Status post total left knee replacement 10/18/2021   Degenerative arthritis of left knee 11/24/2019   Secondary hypercoagulable state ([place]) 01/26/2019   Greater trochanteric bursitis of right hip 09/23/2018   Lumbar radiculopathy 08/12/2018   Gluteal tendonitis of right buttock 07/29/2018   Atrial fibrillation ([place]) 07/21/2018   Arthritis of left acromioclavicular joint 04/26/2018   Partial nontraumatic tear of left rotator cuff 04/26/2018   Hypertension    Seasonal allergies      THERAPY DIAG:  Muscle weakness (generalized)  Localized edema  Difficulty in walking, not elsewhere classified  Stiffness of left knee, not elsewhere  classified  Left knee pain, unspecified chronicity  PCP: Jonathon Jordan, MD   REFERRING PROVIDER: Mcarthur Rossetti, MD   REFERRING DIAG: [phone] (ICD-10-CM) - Unilateral primary osteoarthritis, left knee    EVAL THERAPY DIAG:  Difficulty in walking, not elsewhere classified   Localized edema   Muscle weakness (generalized)   Stiffness of left knee, not elsewhere classified   Left knee pain, unspecified chronicity   Rationale for Evaluation and Treatment Rehabilitation   ONSET DATE: Surgery 10/18/2021   SUBJECTIVE:    SUBJECTIVE STATEMENT: 2-5/10 on the VAS this week.  PERTINENT HISTORY: OA, skin cancer, HTN, previous Rt knee scope, atrial fibrillation  PAIN:  NPRS scale: 2 at rest; 2-5 with activity/10 Pain location: Lt knee Pain description: tightness, throbbing, aching  Aggravating factors: standing, colder weather  Relieving factors: ice, elevation    PRECAUTIONS: None   WEIGHT BEARING RESTRICTIONS: No   FALLS:  Has patient fallen in last 6 months? No   LIVING ENVIRONMENT: Lives with: lives with their spouse Lives in: House/apartment Stairs: 1 stair Has following equipment at home: Environmental consultant - 2 wheeled   OCCUPATION: Retired   PLOF: Independent   PATIENT GOALS: Return to gardening, visiting grand kids     OBJECTIVE:    DIAGNOSTIC FINDINGS:  10/31/2021 review of FINDINGS: Changes of left knee replacement. No hardware  or bony complicating feature. Soft tissue and joint space gas noted.   PATIENT SURVEYS:  11/20/2021  FOTO 31 (Goal 60)  10/31/2021 FOTO 38 (Goal 60 in 15 visits)   COGNITION: 10/31/2021 Overall cognitive status: Within functional limits for tasks assessed                         SENSATION: 10/31/2021 No complaints of peripheral pain or paresthesias   EDEMA:  12/05/2021:   10/31/2021 Noted but not objectively assessed     LOWER EXTREMITY ROM:   Active ROM Right 10/31/2021 Left 10/31/2021 Left 11/01/2021 Left  11/13/2021 Left 11/20/2021 Left 11/25/21 Left 11/27/2021 Left 12/05/2021 Left 12/12/21 Left 12/17/21 Left 12/18/21 Left 12/19/21  Knee flexion 112 40 50 58 62 62 73 PROM 65 AROM in supine heel slide (68 PROM) AA 96 Active 91 (Supine heel slide) Active 91 AA 96 (Supine heel slide)  A: 92 AA: 96 Active 94  Knee extension 0 -3 -3 -3 -2 -3 (LAQ) -2 PROM  -12 AROM in seated LAQ -5 (seated LAQ) -1 (seated LAQ) -1 (seated LAQ) -1  BOLD MEASUREMENTS POST MANIP  (Blank rows = not tested)   LOWER EXTREMITY MMT:   MMT Right 10/31/2021 Left 10/31/2021 Left 12/05/2021  Knee flexion       Knee extension 4/5 2/5 3+/5  BOLD MEASUREMENTS POST MANIP   (Blank rows = not tested)   GAIT: 12/05/2021 : FWW use mod independence  10/31/2021 Distance walked: 50 feet Assistive device utilized: Environmental consultant - 2 wheeled Level of assistance:  Needs walker to reduce L knee WB Comments: Discussed the need for continued AD use to manage edema    TODAY'S TREATMENT  12/19/2021 Recumbent bike Seat 6 for AAROM 5 minutes [place] knee flexion 1 minute AAROM knee flexion (Rt pushes Lt into flexion) 15X 10 seconds (with PT assistance-AAROM)  Functional Activities for sit to stand and stairs: Step-down off 2, 4 and 6 inch step 10X slow eccentrics Bil Step-up and over off 6 inch step, emphasis on avoiding circumduction 20X each Dynamic heel to toe walking 5 laps each way  Vaso L knee Medium Pressure 34* 10 minutes   12/18/21 TherEx SciFit bike seat 9; full revolutions x 8 min AA heel slides on Lt with prolonged hold Leg Press 75# 2x10 bil; then 43# 2x10 Seated overpressure Lt knee flexion 10 x 10 sec hold; RLE providing overpressure  Manual Seated Lt knee flexion; able to get to ~95 deg in sitting; min cues to decrease hip hike   12/17/21 TherEx SciFit bike seat 9; partial revolutions x 8 min Lt LAQ 5# 3x10 LLLD stretch heel slide with strap x 5 min working on incrementally increasing knee  flexion ROM measurements as noted above Supine hamstring stretch 3x20 sec hold on Lt with strap and overpressure at distal thigh   PATIENT EDUCATION:  11/04/2021 Education details: HEP update Person educated: Patient Education method: Consulting civil engineer, Demonstration, Tactile cues, Verbal cues, and Handouts Education comprehension: verbalized understanding, returned demonstration, verbal cues required, tactile cues required   HOME EXERCISE PROGRAM: Access Code: [email or web address] URL: [email or web address] Date: 11/04/2021 Prepared by: Scot Jun  Exercises - Supine Quadricep Sets  - 5 x daily - 7 x weekly - 2 sets - 10 reps - 5 second hold - Seated Knee Flexion AAROM  - 5 x daily - 7 x weekly - 1 sets - 1 reps - 3 minutes hold - Seated Long Arc Quad (Mirrored)  - 3-5 x  daily - 7 x weekly - 1-2 sets - 10 reps - 2 hold - Ankle Pumps in Elevation  - 2-3 x daily - 7 x weekly - 1-2 sets - 10 reps   ASSESSMENT:   CLINICAL IMPRESSION: AROM knee flexion is now 94*.  Extension AROM is excellent.  Quadriceps strength, function with stairs, sit to stand and balance/gait without the cane remain the focus of Kellyjo's supervised PT.  I would like to see her at 100+ degrees of flexion and comfortable without the cane by the first of the year.  Possible DC into independent PT is these goals are met.   OBJECTIVE IMPAIRMENTS: Abnormal gait, decreased activity tolerance, decreased balance, decreased coordination, decreased endurance, decreased knowledge of condition, difficulty walking, decreased ROM, decreased strength, decreased safety awareness, increased edema, impaired perceived functional ability, obesity, and pain.    ACTIVITY LIMITATIONS: carrying, bending, sitting, standing, squatting, stairs, bed mobility, dressing, and locomotion level   PARTICIPATION LIMITATIONS: cleaning, driving, shopping, community activity, and yard work   PERSONAL FACTORS: OA, skin cancer, HTN, previous Rt knee  scope, atrial fibrillation are also affecting patient's functional outcome.    REHAB POTENTIAL: Good   CLINICAL DECISION MAKING: Stable/uncomplicated   EVALUATION COMPLEXITY: Low     GOALS: Goals reviewed with patient? Yes    SHORT TERM GOALS: Target date: 11/28/2021  Arlyne will improve Lt knee flexion AROM to 90 degrees Baseline: 40 degrees Goal status: (MET 12/12/21)   2.  Glendoris will be independent and compliant 5/7 days with her starter HEP for 2 weeks Baseline:  Goal status: Met 11/13/2021     LONG TERM GOALS: Target date: 01/09/2022    Improve FOTO to 60 Baseline: 38 Goal status: Ongoing 12/12/21   2.  Tahnee will report Lt knee pain consistently 3/10 or better on the VAS Baseline: Can be 7/10 Goal status: On Going 12/19/2021   3.  Improve Lt knee AROM to 2-0-105 or better Baseline: 3-0-40 Goal status: Partially Met 12/19/2021   4.  Improve Lt knee strength as assessed by MMT, hand-held dynamometer and transition to a cane or no AD Baseline: Walker and poor (2/5 MMT) strength Goal status: On Going 12/12/2021   5.  Tallia will be compliant and independent with her long-term HEP at DC Baseline: Started 10/31/2021 Goal status: On Going 12/19/2021     PLAN:   PT FREQUENCY:  2-3x/week   PT DURATION: 10 weeks   PLANNED INTERVENTIONS: Therapeutic exercises, Therapeutic activity, Neuromuscular re-education, Balance training, Gait training, Patient/Family education, Self Care, Joint mobilization, Stair training, Electrical stimulation, Cryotherapy, Vasopneumatic device, and Manual therapy   PLAN FOR NEXT SESSION:  Flexion AROM, functional strength and balance for gait without the cane.   Farley Ly PT, MPT 12/19/21 2:45 PM

## 2021-12-20 ENCOUNTER — Encounter: Payer: Self-pay | Admitting: Physical Therapy

## 2021-12-20 ENCOUNTER — Ambulatory Visit (INDEPENDENT_AMBULATORY_CARE_PROVIDER_SITE_OTHER): Payer: Medicare Other | Admitting: Physical Therapy

## 2021-12-20 DIAGNOSIS — R262 Difficulty in walking, not elsewhere classified: Secondary | ICD-10-CM

## 2021-12-20 DIAGNOSIS — M25662 Stiffness of left knee, not elsewhere classified: Secondary | ICD-10-CM | POA: Diagnosis not present

## 2021-12-20 DIAGNOSIS — M6281 Muscle weakness (generalized): Secondary | ICD-10-CM | POA: Diagnosis not present

## 2021-12-20 DIAGNOSIS — R600 Localized edema: Secondary | ICD-10-CM

## 2021-12-20 DIAGNOSIS — M25562 Pain in left knee: Secondary | ICD-10-CM | POA: Diagnosis not present

## 2021-12-20 NOTE — Therapy (Signed)
OUTPATIENT PHYSICAL THERAPY TREATMENT   Patient Name: Bridget [name] MRN: [id number] DOB:[birth year], 76 y.o., female Today's Date: 12/20/2021   END OF SESSION:   PT End of Session - 12/20/21 1053     Visit Number 24    Number of Visits 36    Date for PT Re-Evaluation 01/20/22    Progress Note Due on Visit 28    PT Start Time 1053    PT Stop Time 1135    PT Time Calculation (min) 42 min    Activity Tolerance Patient tolerated treatment well;Patient limited by pain;No increased pain    Behavior During Therapy WFL for tasks assessed/performed                Past Medical History:  Diagnosis Date   Arthritis    Cancer ([place])    basal cell cancer removed on face   Hypertension    Seasonal allergies    Sleep apnea    Past Surgical History:  Procedure Laterality Date   BREAST EXCISIONAL BIOPSY Left    CHOLECYSTECTOMY  02/2010   KNEE SURGERY Right    Meniscal tear   OOPHORECTOMY  01/2003   RSO AT [facility]   TOTAL KNEE ARTHROPLASTY Left 10/18/2021   Procedure: LEFT TOTAL KNEE ARTHROPLASTY;  Surgeon: Mcarthur Rossetti, MD;  Location: [facility];  Service: Orthopedics;  Laterality: Left;   VAGINAL HYSTERECTOMY  01/2003   [facility],RSO A&P REPAIR   WISDOM TOOTH EXTRACTION     Patient Active Problem List   Diagnosis Date Noted   Status post total left knee replacement 10/18/2021   Degenerative arthritis of left knee 11/24/2019   Secondary hypercoagulable state ([place]) 01/26/2019   Greater trochanteric bursitis of right hip 09/23/2018   Lumbar radiculopathy 08/12/2018   Gluteal tendonitis of right buttock 07/29/2018   Atrial fibrillation ([place]) 07/21/2018   Arthritis of left acromioclavicular joint 04/26/2018   Partial nontraumatic tear of left rotator cuff 04/26/2018   Hypertension    Seasonal allergies      THERAPY DIAG:  Muscle weakness (generalized)  Localized edema  Difficulty in walking, not elsewhere classified  Stiffness of left knee, not elsewhere  classified  Left knee pain, unspecified chronicity  PCP: Jonathon Jordan, MD   REFERRING PROVIDER: Mcarthur Rossetti, MD   REFERRING DIAG: [phone] (ICD-10-CM) - Unilateral primary osteoarthritis, left knee    EVAL THERAPY DIAG:  Difficulty in walking, not elsewhere classified   Localized edema   Muscle weakness (generalized)   Stiffness of left knee, not elsewhere classified   Left knee pain, unspecified chronicity   Rationale for Evaluation and Treatment Rehabilitation   ONSET DATE: Surgery 10/18/2021   SUBJECTIVE:    SUBJECTIVE STATEMENT: Still c/o stiffness, but otherwise no pain, doing well   PERTINENT HISTORY: OA, skin cancer, HTN, previous Rt knee scope, atrial fibrillation  PAIN:  NPRS scale: 2 at rest; 2-5 with activity/10 Pain location: Lt knee Pain description: tightness, throbbing, aching  Aggravating factors: standing, colder weather  Relieving factors: ice, elevation    PRECAUTIONS: None   WEIGHT BEARING RESTRICTIONS: No   FALLS:  Has patient fallen in last 6 months? No   LIVING ENVIRONMENT: Lives with: lives with their spouse Lives in: House/apartment Stairs: 1 stair Has following equipment at home: Environmental consultant - 2 wheeled   OCCUPATION: Retired   PLOF: Independent   PATIENT GOALS: Return to gardening, visiting grand kids     OBJECTIVE:    DIAGNOSTIC FINDINGS:  10/31/2021 review of FINDINGS: Changes of  left knee replacement. No hardware or bony complicating feature. Soft tissue and joint space gas noted.   PATIENT SURVEYS:  11/20/2021  FOTO 31 (Goal 60)  10/31/2021 FOTO 38 (Goal 60 in 15 visits)   COGNITION: 10/31/2021 Overall cognitive status: Within functional limits for tasks assessed                         SENSATION: 10/31/2021 No complaints of peripheral pain or paresthesias   EDEMA:  12/05/2021:   10/31/2021 Noted but not objectively assessed     LOWER EXTREMITY ROM:   Active ROM Right 10/31/2021  Left 10/31/2021 Left 11/01/2021 Left 11/13/2021 Left 11/20/2021 Left 11/25/21 Left 11/27/2021 Left 12/05/2021 Left 12/12/21 Left 12/17/21 Left 12/18/21 Left 12/19/21 Left 12/20/21  Knee flexion 112 40 50 58 62 62 73 PROM 65 AROM in supine heel slide (68 PROM) AA 96 Active 91 (Supine heel slide) Active 91 AA 96 (Supine heel slide)  A: 92 AA: 96 Active 94 AA: 100  Knee extension 0 -3 -3 -3 -2 -3 (LAQ) -2 PROM  -12 AROM in seated LAQ -5 (seated LAQ) -1 (seated LAQ) -1 (seated LAQ) -1   BOLD MEASUREMENTS POST MANIP  (Blank rows = not tested)   LOWER EXTREMITY MMT:   MMT Right 10/31/2021 Left 10/31/2021 Left 12/05/2021  Knee flexion       Knee extension 4/5 2/5 3+/5  BOLD MEASUREMENTS POST MANIP   (Blank rows = not tested)   GAIT: 12/05/2021 : FWW use mod independence  10/31/2021 Distance walked: 50 feet Assistive device utilized: Environmental consultant - 2 wheeled Level of assistance:  Needs walker to reduce L knee WB Comments: Discussed the need for continued AD use to manage edema    TODAY'S TREATMENT  12/20/21 Recumbent bike Seat 8-6 for AAROM 8 minutes Leg Press 87# 3x10; LLE 43# 2x10 LAQ Lt 5# 2x10; 5 sec hold LLLD heel slide with incremental flexion increases x 4 min, pt using strap  Manual Seated Lt knee flexion PROM to tolerance, min cues to decrease hip hike  12/19/2021 Recumbent bike Seat 6 for AAROM 5 minutes Tailgate knee flexion 1 minute AAROM knee flexion (Rt pushes Lt into flexion) 15X 10 seconds (with PT assistance-AAROM)  Functional Activities for sit to stand and stairs: Step-down off 2, 4 and 6 inch step 10X slow eccentrics Bil Step-up and over off 6 inch step, emphasis on avoiding circumduction 20X each Dynamic heel to toe walking 5 laps each way  Vaso L knee Medium Pressure 34* 10 minutes   12/18/21 TherEx SciFit bike seat 9; full revolutions x 8 min AA heel slides on Lt with prolonged hold Leg Press 75# 2x10 bil; then 43# 2x10 Seated overpressure Lt  knee flexion 10 x 10 sec hold; RLE providing overpressure  Manual Seated Lt knee flexion; able to get to ~95 deg in sitting; min cues to decrease hip hike   12/17/21 TherEx SciFit bike seat 9; partial revolutions x 8 min Lt LAQ 5# 3x10 LLLD stretch heel slide with strap x 5 min working on incrementally increasing knee flexion ROM measurements as noted above Supine hamstring stretch 3x20 sec hold on Lt with strap and overpressure at distal thigh   PATIENT EDUCATION:  11/04/2021 Education details: HEP update Person educated: Patient Education method: Consulting civil engineer, Demonstration, Tactile cues, Verbal cues, and Handouts Education comprehension: verbalized understanding, returned demonstration, verbal cues required, tactile cues required   HOME EXERCISE PROGRAM: Access Code: [email or web address] URL: [email or web address] Date: 11/04/2021 Prepared  by: Scot Jun  Exercises - Supine Quadricep Sets  - 5 x daily - 7 x weekly - 2 sets - 10 reps - 5 second hold - Seated Knee Flexion AAROM  - 5 x daily - 7 x weekly - 1 sets - 1 reps - 3 minutes hold - Seated Long Arc Quad (Mirrored)  - 3-5 x daily - 7 x weekly - 1-2 sets - 10 reps - 2 hold - Ankle Pumps in Elevation  - 2-3 x daily - 7 x weekly - 1-2 sets - 10 reps   ASSESSMENT:   CLINICAL IMPRESSION: Pt improved AA knee flexion today to 100 degrees.  Overall slow improvement in flexion noted.  Will continue to benefit from PT to maximize function.   OBJECTIVE IMPAIRMENTS: Abnormal gait, decreased activity tolerance, decreased balance, decreased coordination, decreased endurance, decreased knowledge of condition, difficulty walking, decreased ROM, decreased strength, decreased safety awareness, increased edema, impaired perceived functional ability, obesity, and pain.    ACTIVITY LIMITATIONS: carrying, bending, sitting, standing, squatting, stairs, bed mobility, dressing, and locomotion level   PARTICIPATION LIMITATIONS: cleaning,  driving, shopping, community activity, and yard work   PERSONAL FACTORS: OA, skin cancer, HTN, previous Rt knee scope, atrial fibrillation are also affecting patient's functional outcome.    REHAB POTENTIAL: Good   CLINICAL DECISION MAKING: Stable/uncomplicated   EVALUATION COMPLEXITY: Low     GOALS: Goals reviewed with patient? Yes    SHORT TERM GOALS: Target date: 11/28/2021  Bridget [name] will improve Lt knee flexion AROM to 90 degrees Baseline: 40 degrees Goal status: (MET 12/12/21)   2.  Bridget [name] will be independent and compliant 5/7 days with her starter HEP for 2 weeks Baseline:  Goal status: Met 11/13/2021     LONG TERM GOALS: Target date: 01/09/2022    Improve FOTO to 60 Baseline: 38 Goal status: Ongoing 12/12/21   2.  Bridget [name] will report Lt knee pain consistently 3/10 or better on the VAS Baseline: Can be 7/10 Goal status: On Going 12/19/2021   3.  Improve Lt knee AROM to 2-0-105 or better Baseline: 3-0-40 Goal status: Partially Met 12/19/2021   4.  Improve Lt knee strength as assessed by MMT, hand-held dynamometer and transition to a cane or no AD Baseline: Walker and poor (2/5 MMT) strength Goal status: On Going 12/12/2021   5.  Bridget [name] will be compliant and independent with her long-term HEP at DC Baseline: Started 10/31/2021 Goal status: On Going 12/19/2021     PLAN:   PT FREQUENCY:  2-3x/week   PT DURATION: 10 weeks   PLANNED INTERVENTIONS: Therapeutic exercises, Therapeutic activity, Neuromuscular re-education, Balance training, Gait training, Patient/Family education, Self Care, Joint mobilization, Stair training, Electrical stimulation, Cryotherapy, Vasopneumatic device, and Manual therapy   PLAN FOR NEXT SESSION:  Flexion AROM, functional strength and balance for gait without the cane.   Laureen Abrahams, PT, DPT 12/20/21 11:38 AM

## 2021-12-22 NOTE — Therapy (Incomplete)
OUTPATIENT PHYSICAL THERAPY TREATMENT   Patient Name: Bridget [name] MRN: [id number] DOB:[birth year], 76 y.o., female Today's Date: 12/22/2021   END OF SESSION:        Past Medical History:  Diagnosis Date   Arthritis    Cancer ([place])    basal cell cancer removed on face   Hypertension    Seasonal allergies    Sleep apnea    Past Surgical History:  Procedure Laterality Date   BREAST EXCISIONAL BIOPSY Left    CHOLECYSTECTOMY  02/2010   KNEE SURGERY Right    Meniscal tear   OOPHORECTOMY  01/2003   RSO AT [facility]   TOTAL KNEE ARTHROPLASTY Left 10/18/2021   Procedure: LEFT TOTAL KNEE ARTHROPLASTY;  Surgeon: Mcarthur Rossetti, MD;  Location: [facility];  Service: Orthopedics;  Laterality: Left;   VAGINAL HYSTERECTOMY  01/2003   TVH,RSO A&P REPAIR   WISDOM TOOTH EXTRACTION     Patient Active Problem List   Diagnosis Date Noted   Status post total left knee replacement 10/18/2021   Degenerative arthritis of left knee 11/24/2019   Secondary hypercoagulable state ([place]) 01/26/2019   Greater trochanteric bursitis of right hip 09/23/2018   Lumbar radiculopathy 08/12/2018   Gluteal tendonitis of right buttock 07/29/2018   Atrial fibrillation ([place]) 07/21/2018   Arthritis of left acromioclavicular joint 04/26/2018   Partial nontraumatic tear of left rotator cuff 04/26/2018   Hypertension    Seasonal allergies      THERAPY DIAG:  No diagnosis found.  PCP: Jonathon Jordan, MD   REFERRING PROVIDER: Mcarthur Rossetti, MD   REFERRING DIAG: [phone] (ICD-10-CM) - Unilateral primary osteoarthritis, left knee    EVAL THERAPY DIAG:  Difficulty in walking, not elsewhere classified   Localized edema   Muscle weakness (generalized)   Stiffness of left knee, not elsewhere classified   Left knee pain, unspecified chronicity   Rationale for Evaluation and Treatment Rehabilitation   ONSET DATE: Surgery 10/18/2021   SUBJECTIVE:    SUBJECTIVE  STATEMENT: ***   PERTINENT HISTORY: OA, skin cancer, HTN, previous Rt knee scope, atrial fibrillation  PAIN:  NPRS scale: 2 at rest; 2-5 with activity/10 Pain location: Lt knee Pain description: tightness, throbbing, aching  Aggravating factors: standing, colder weather  Relieving factors: ice, elevation    PRECAUTIONS: None   WEIGHT BEARING RESTRICTIONS: No   FALLS:  Has patient fallen in last 6 months? No   LIVING ENVIRONMENT: Lives with: lives with their spouse Lives in: House/apartment Stairs: 1 stair Has following equipment at home: Environmental consultant - 2 wheeled   OCCUPATION: Retired   PLOF: Independent   PATIENT GOALS: Return to gardening, visiting grand kids     OBJECTIVE:    DIAGNOSTIC FINDINGS:  10/31/2021 review of FINDINGS: Changes of left knee replacement. No hardware or bony complicating feature. Soft tissue and joint space gas noted.   PATIENT SURVEYS:  11/20/2021  FOTO 31 (Goal 60)  10/31/2021 FOTO 38 (Goal 60 in 15 visits)   COGNITION: 10/31/2021 Overall cognitive status: Within functional limits for tasks assessed                         SENSATION: 10/31/2021 No complaints of peripheral pain or paresthesias   EDEMA:  12/05/2021:   10/31/2021 Noted but not objectively assessed     LOWER EXTREMITY ROM:   Active ROM Right 10/31/2021 Left 10/31/2021 Left 11/01/2021 Left 11/13/2021 Left 11/20/2021 Left 11/25/21 Left 11/27/2021 Left 12/05/2021 Left 12/12/21 Left 12/17/21 Left  12/18/21 Left 12/19/21 Left 12/20/21  Knee flexion 112 40 50 58 62 62 73 PROM 65 AROM in supine heel slide (68 PROM) AA 96 Active 91 (Supine heel slide) Active 91 AA 96 (Supine heel slide)  A: 92 AA: 96 Active 94 AA: 100  Knee extension 0 -3 -3 -3 -2 -3 (LAQ) -2 PROM  -12 AROM in seated LAQ -5 (seated LAQ) -1 (seated LAQ) -1 (seated LAQ) -1   BOLD MEASUREMENTS POST MANIP  (Blank rows = not tested)   LOWER EXTREMITY MMT:   MMT Right 10/31/2021 Left 10/31/2021  Left 12/05/2021  Knee flexion       Knee extension 4/5 2/5 3+/5  BOLD MEASUREMENTS POST MANIP   (Blank rows = not tested)   GAIT: 12/05/2021 : FWW use mod independence  10/31/2021 Distance walked: 50 feet Assistive device utilized: Environmental consultant - 2 wheeled Level of assistance:  Needs walker to reduce L knee WB Comments: Discussed the need for continued AD use to manage edema    TODAY'S TREATMENT  12/23/21 *** Recumbent bike Seat 8-6 for AAROM 8 minutes Leg Press 87# 3x10; LLE 43# 2x10 LAQ Lt 5# 2x10; 5 sec hold LLLD heel slide with incremental flexion increases x 4 min, pt using strap   12/20/21 Recumbent bike Seat 8-6 for AAROM 8 minutes Leg Press 87# 3x10; LLE 43# 2x10 LAQ Lt 5# 2x10; 5 sec hold LLLD heel slide with incremental flexion increases x 4 min, pt using strap  Manual Seated Lt knee flexion PROM to tolerance, min cues to decrease hip hike  12/19/2021 Recumbent bike Seat 6 for AAROM 5 minutes Tailgate knee flexion 1 minute AAROM knee flexion (Rt pushes Lt into flexion) 15X 10 seconds (with PT assistance-AAROM)  Functional Activities for sit to stand and stairs: Step-down off 2, 4 and 6 inch step 10X slow eccentrics Bil Step-up and over off 6 inch step, emphasis on avoiding circumduction 20X each Dynamic heel to toe walking 5 laps each way  Vaso L knee Medium Pressure 34* 10 minutes    PATIENT EDUCATION: *** 11/04/2021 Education details: HEP update Person educated: Patient Education method: Consulting civil engineer, Demonstration, Corporate treasurer cues, Verbal cues, and Handouts Education comprehension: verbalized understanding, returned demonstration, verbal cues required, tactile cues required   HOME EXERCISE PROGRAM: Access Code: [email or web address] URL: [email or web address] Date: 11/04/2021 Prepared by: Scot Jun  Exercises - Supine Quadricep Sets  - 5 x daily - 7 x weekly - 2 sets - 10 reps - 5 second hold - Seated Knee Flexion AAROM  - 5 x daily - 7 x weekly  - 1 sets - 1 reps - 3 minutes hold - Seated Long Arc Quad (Mirrored)  - 3-5 x daily - 7 x weekly - 1-2 sets - 10 reps - 2 hold - Ankle Pumps in Elevation  - 2-3 x daily - 7 x weekly - 1-2 sets - 10 reps   ASSESSMENT:   CLINICAL IMPRESSION: ***   OBJECTIVE IMPAIRMENTS: Abnormal gait, decreased activity tolerance, decreased balance, decreased coordination, decreased endurance, decreased knowledge of condition, difficulty walking, decreased ROM, decreased strength, decreased safety awareness, increased edema, impaired perceived functional ability, obesity, and pain.    ACTIVITY LIMITATIONS: carrying, bending, sitting, standing, squatting, stairs, bed mobility, dressing, and locomotion level   PARTICIPATION LIMITATIONS: cleaning, driving, shopping, community activity, and yard work   PERSONAL FACTORS: OA, skin cancer, HTN, previous Rt knee scope, atrial fibrillation are also affecting patient's functional outcome.    REHAB POTENTIAL: Good   CLINICAL DECISION MAKING:  Stable/uncomplicated   EVALUATION COMPLEXITY: Low     GOALS: Goals reviewed with patient? Yes    SHORT TERM GOALS: Target date: 11/28/2021  Cheral will improve Lt knee flexion AROM to 90 degrees Baseline: 40 degrees Goal status: (MET 12/12/21)   2.  Volanda will be independent and compliant 5/7 days with her starter HEP for 2 weeks Baseline:  Goal status: Met 11/13/2021     LONG TERM GOALS: Target date: 01/09/2022    Improve FOTO to 60 Baseline: 38 Goal status: Ongoing 12/12/21   2.  Shinika will report Lt knee pain consistently 3/10 or better on the VAS Baseline: Can be 7/10 Goal status: On Going 12/19/2021   3.  Improve Lt knee AROM to 2-0-105 or better Baseline: 3-0-40 Goal status: Partially Met 12/19/2021   4.  Improve Lt knee strength as assessed by MMT, hand-held dynamometer and transition to a cane or no AD Baseline: Walker and poor (2/5 MMT) strength Goal status: On Going 12/12/2021   5.  Gaynelle will be  compliant and independent with her long-term HEP at DC Baseline: Started 10/31/2021 Goal status: On Going 12/19/2021     PLAN:   PT FREQUENCY:  2-3x/week   PT DURATION: 10 weeks   PLANNED INTERVENTIONS: Therapeutic exercises, Therapeutic activity, Neuromuscular re-education, Balance training, Gait training, Patient/Family education, Self Care, Joint mobilization, Stair training, Electrical stimulation, Cryotherapy, Vasopneumatic device, and Manual therapy   PLAN FOR NEXT SESSION:  Flexion AROM, functional strength and balance for gait without the cane.   12/22/21 3:01 PM

## 2021-12-23 ENCOUNTER — Ambulatory Visit (INDEPENDENT_AMBULATORY_CARE_PROVIDER_SITE_OTHER): Payer: Medicare Other | Admitting: Rehabilitative and Restorative Service Providers"

## 2021-12-23 ENCOUNTER — Encounter: Payer: Self-pay | Admitting: Rehabilitative and Restorative Service Providers"

## 2021-12-23 DIAGNOSIS — R600 Localized edema: Secondary | ICD-10-CM | POA: Diagnosis not present

## 2021-12-23 DIAGNOSIS — M25662 Stiffness of left knee, not elsewhere classified: Secondary | ICD-10-CM | POA: Diagnosis not present

## 2021-12-23 DIAGNOSIS — R262 Difficulty in walking, not elsewhere classified: Secondary | ICD-10-CM | POA: Diagnosis not present

## 2021-12-23 DIAGNOSIS — M6281 Muscle weakness (generalized): Secondary | ICD-10-CM

## 2021-12-23 DIAGNOSIS — M25562 Pain in left knee: Secondary | ICD-10-CM

## 2021-12-23 NOTE — Therapy (Signed)
OUTPATIENT PHYSICAL THERAPY TREATMENT   Patient Name: Bridget [name] MRN: [id number] DOB:25-Jan-1945, 76 y.o., female Today's Date: 12/23/2021   END OF SESSION:   PT End of Session - 12/23/21 1415     Visit Number 25    Number of Visits 36    Date for PT Re-Evaluation 01/20/22    Progress Note Due on Visit 28    PT Start Time 1351    PT Stop Time 1430    PT Time Calculation (min) 39 min    Activity Tolerance Patient tolerated treatment well;No increased pain    Behavior During Therapy WFL for tasks assessed/performed                 Past Medical History:  Diagnosis Date   Arthritis    Cancer ([place])    basal cell cancer removed on face   Hypertension    Seasonal allergies    Sleep apnea    Past Surgical History:  Procedure Laterality Date   BREAST EXCISIONAL BIOPSY Left    CHOLECYSTECTOMY  02/2010   KNEE SURGERY Right    Meniscal tear   OOPHORECTOMY  01/2003   RSO AT [facility]   TOTAL KNEE ARTHROPLASTY Left 10/18/2021   Procedure: LEFT TOTAL KNEE ARTHROPLASTY;  Surgeon: Mcarthur Rossetti, MD;  Location: [facility];  Service: Orthopedics;  Laterality: Left;   VAGINAL HYSTERECTOMY  01/2003   [facility],RSO A&P REPAIR   WISDOM TOOTH EXTRACTION     Patient Active Problem List   Diagnosis Date Noted   Status post total left knee replacement 10/18/2021   Degenerative arthritis of left knee 11/24/2019   Secondary hypercoagulable state ([place]) 01/26/2019   Greater trochanteric bursitis of right hip 09/23/2018   Lumbar radiculopathy 08/12/2018   Gluteal tendonitis of right buttock 07/29/2018   Atrial fibrillation ([place]) 07/21/2018   Arthritis of left acromioclavicular joint 04/26/2018   Partial nontraumatic tear of left rotator cuff 04/26/2018   Hypertension    Seasonal allergies      THERAPY DIAG:  Muscle weakness (generalized)  Localized edema  Difficulty in walking, not elsewhere classified  Left knee pain, unspecified chronicity  Stiffness of left knee,  not elsewhere classified  PCP: Jonathon Jordan, MD   REFERRING PROVIDER: Mcarthur Rossetti, MD   REFERRING DIAG: [phone] (ICD-10-CM) - Unilateral primary osteoarthritis, left knee    EVAL THERAPY DIAG:  Difficulty in walking, not elsewhere classified   Localized edema   Muscle weakness (generalized)   Stiffness of left knee, not elsewhere classified   Left knee pain, unspecified chronicity   Rationale for Evaluation and Treatment Rehabilitation   ONSET DATE: Surgery 10/18/2021   SUBJECTIVE:    SUBJECTIVE STATEMENT: Pt indicated she did some work outside for several hours on Saturday.  Pt indicated exhausted but not painful from it.     PERTINENT HISTORY: OA, skin cancer, HTN, previous Rt knee scope, atrial fibrillation  PAIN:  NPRS scale: no pain at rest Pain location: Lt knee Pain description: tightness, throbbing, aching  Aggravating factors: standing, colder weather  Relieving factors: ice, elevation    PRECAUTIONS: None   WEIGHT BEARING RESTRICTIONS: No   FALLS:  Has patient fallen in last 6 months? No   LIVING ENVIRONMENT: Lives with: lives with their spouse Lives in: House/apartment Stairs: 1 stair Has following equipment at home: Environmental consultant - 2 wheeled   OCCUPATION: Retired   PLOF: Independent   PATIENT GOALS: Return to gardening, visiting grand kids     OBJECTIVE:  DIAGNOSTIC FINDINGS:  10/31/2021 review of FINDINGS: Changes of left knee replacement. No hardware or bony complicating feature. Soft tissue and joint space gas noted.   PATIENT SURVEYS:  11/20/2021  FOTO 31 (Goal 60)  10/31/2021 FOTO 38 (Goal 60 in 15 visits)   COGNITION: 10/31/2021 Overall cognitive status: Within functional limits for tasks assessed                         SENSATION: 10/31/2021 No complaints of peripheral pain or paresthesias   EDEMA:  12/05/2021:   10/31/2021 Noted but not objectively assessed     LOWER EXTREMITY ROM:   Active ROM  Right 10/31/2021 Left 10/31/2021 Left 11/01/2021 Left 11/13/2021 Left 11/20/2021 Left 11/25/21 Left 11/27/2021 Left 12/05/2021 Left 12/12/21 Left 12/17/21 Left 12/18/21 Left 12/19/21 Left 12/20/21  Knee flexion 112 40 50 58 62 62 73 PROM 65 AROM in supine heel slide (68 PROM) AA 96 Active 91 (Supine heel slide) Active 91 AA 96 (Supine heel slide)  A: 92 AA: 96 Active 94 AA: 100  Knee extension 0 -3 -3 -3 -2 -3 (LAQ) -2 PROM  -12 AROM in seated LAQ -5 (seated LAQ) -1 (seated LAQ) -1 (seated LAQ) -1   BOLD MEASUREMENTS POST MANIP  (Blank rows = not tested)   LOWER EXTREMITY MMT:   MMT Right 10/31/2021 Left 10/31/2021 Left 12/05/2021  Knee flexion       Knee extension 4/5 2/5 3+/5  BOLD MEASUREMENTS POST MANIP   (Blank rows = not tested)   GAIT: 12/05/2021 : FWW use mod independence  10/31/2021 Distance walked: 50 feet Assistive device utilized: Environmental consultant - 2 wheeled Level of assistance:  Needs walker to reduce L knee WB Comments: Discussed the need for continued AD use to manage edema    TODAY'S TREATMENT  12/23/2021: Therex Recumbent bike seat 5 partial to full revolutions 9 mins Seated Lt LAQ 5 lbs c pause in end ranges each way 2 x 15 Leg press double leg 87 lbs x 15, Lt leg only 2 x 15 43 lbs    Manual: Seated Lt knee flexion c distraction/IR mobilization c movement.  Overpressure into flexion stretching   12/20/2021 Recumbent bike Seat 8-6 for AAROM 8 minutes Leg Press 87# 3x10; LLE 43# 2x10 LAQ Lt 5# 2x10; 5 sec hold LLLD heel slide with incremental flexion increases x 4 min, pt using strap  Manual Seated Lt knee flexion PROM to tolerance, min cues to decrease hip hike  12/19/2021 Recumbent bike Seat 6 for AAROM 5 minutes Tailgate knee flexion 1 minute AAROM knee flexion (Rt pushes Lt into flexion) 15X 10 seconds (with PT assistance-AAROM)  Functional Activities for sit to stand and stairs: Step-down off 2, 4 and 6 inch step 10X slow eccentrics  Bil Step-up and over off 6 inch step, emphasis on avoiding circumduction 20X each Dynamic heel to toe walking 5 laps each way  Vaso L knee Medium Pressure 34* 10 minutes   12/18/21 TherEx SciFit bike seat 9; full revolutions x 8 min AA heel slides on Lt with prolonged hold Leg Press 75# 2x10 bil; then 43# 2x10 Seated overpressure Lt knee flexion 10 x 10 sec hold; RLE providing overpressure  Manual Seated Lt knee flexion; able to get to ~95 deg in sitting; min cues to decrease hip hike   PATIENT EDUCATION:  11/04/2021 Education details: HEP update Person educated: Patient Education method: Explanation, Demonstration, Corporate treasurer cues, Verbal cues, and Handouts Education comprehension: verbalized understanding, returned  demonstration, verbal cues required, tactile cues required   HOME EXERCISE PROGRAM: Access Code: [email or web address] URL: [email or web address] Date: 11/04/2021 Prepared by: Scot Jun  Exercises - Supine Quadricep Sets  - 5 x daily - 7 x weekly - 2 sets - 10 reps - 5 second hold - Seated Knee Flexion AAROM  - 5 x daily - 7 x weekly - 1 sets - 1 reps - 3 minutes hold - Seated Long Arc Quad (Mirrored)  - 3-5 x daily - 7 x weekly - 1-2 sets - 10 reps - 2 hold - Ankle Pumps in Elevation  - 2-3 x daily - 7 x weekly - 1-2 sets - 10 reps   ASSESSMENT:   CLINICAL IMPRESSION: Edema still noted throughout bilateral LE that can impact function and overall improvement.   Continued improvements in strength to help ambulation.    OBJECTIVE IMPAIRMENTS: Abnormal gait, decreased activity tolerance, decreased balance, decreased coordination, decreased endurance, decreased knowledge of condition, difficulty walking, decreased ROM, decreased strength, decreased safety awareness, increased edema, impaired perceived functional ability, obesity, and pain.    ACTIVITY LIMITATIONS: carrying, bending, sitting, standing, squatting, stairs, bed mobility, dressing, and locomotion  level   PARTICIPATION LIMITATIONS: cleaning, driving, shopping, community activity, and yard work   PERSONAL FACTORS: OA, skin cancer, HTN, previous Rt knee scope, atrial fibrillation are also affecting patient's functional outcome.    REHAB POTENTIAL: Good   CLINICAL DECISION MAKING: Stable/uncomplicated   EVALUATION COMPLEXITY: Low     GOALS: Goals reviewed with patient? Yes    SHORT TERM GOALS: Target date: 11/28/2021  Avalina will improve Lt knee flexion AROM to 90 degrees Baseline: 40 degrees Goal status: (MET 12/12/21)   2.  Shy will be independent and compliant 5/7 days with her starter HEP for 2 weeks Baseline:  Goal status: Met 11/13/2021     LONG TERM GOALS: Target date: 01/09/2022    Improve FOTO to 60 Baseline: 38 Goal status: Ongoing 12/12/21   2.  Shawnise will report Lt knee pain consistently 3/10 or better on the VAS Baseline: Can be 7/10 Goal status: On Going 12/19/2021   3.  Improve Lt knee AROM to 2-0-105 or better Baseline: 3-0-40 Goal status: Partially Met 12/19/2021   4.  Improve Lt knee strength as assessed by MMT, hand-held dynamometer and transition to a cane or no AD Baseline: Walker and poor (2/5 MMT) strength Goal status: On Going 12/12/2021   5.  Taimane will be compliant and independent with her long-term HEP at DC Baseline: Started 10/31/2021 Goal status: On Going 12/19/2021     PLAN:   PT FREQUENCY:  2-3x/week   PT DURATION: 10 weeks   PLANNED INTERVENTIONS: Therapeutic exercises, Therapeutic activity, Neuromuscular re-education, Balance training, Gait training, Patient/Family education, Self Care, Joint mobilization, Stair training, Electrical stimulation, Cryotherapy, Vasopneumatic device, and Manual therapy   PLAN FOR NEXT SESSION:  Flexion AROM, functional strengthening. Dynamic balance as able   Scot Jun, PT, DPT, OCS, ATC 12/23/21  2:31 PM

## 2021-12-26 ENCOUNTER — Ambulatory Visit (INDEPENDENT_AMBULATORY_CARE_PROVIDER_SITE_OTHER): Payer: Medicare Other | Admitting: Rehabilitative and Restorative Service Providers"

## 2021-12-26 ENCOUNTER — Encounter: Payer: Self-pay | Admitting: Rehabilitative and Restorative Service Providers"

## 2021-12-26 DIAGNOSIS — I8310 Varicose veins of unspecified lower extremity with inflammation: Secondary | ICD-10-CM | POA: Diagnosis not present

## 2021-12-26 DIAGNOSIS — I519 Heart disease, unspecified: Secondary | ICD-10-CM | POA: Diagnosis not present

## 2021-12-26 DIAGNOSIS — E7800 Pure hypercholesterolemia, unspecified: Secondary | ICD-10-CM | POA: Diagnosis not present

## 2021-12-26 DIAGNOSIS — R7303 Prediabetes: Secondary | ICD-10-CM | POA: Diagnosis not present

## 2021-12-26 DIAGNOSIS — R600 Localized edema: Secondary | ICD-10-CM

## 2021-12-26 DIAGNOSIS — I10 Essential (primary) hypertension: Secondary | ICD-10-CM | POA: Diagnosis not present

## 2021-12-26 DIAGNOSIS — M25662 Stiffness of left knee, not elsewhere classified: Secondary | ICD-10-CM

## 2021-12-26 DIAGNOSIS — D692 Other nonthrombocytopenic purpura: Secondary | ICD-10-CM | POA: Diagnosis not present

## 2021-12-26 DIAGNOSIS — D6869 Other thrombophilia: Secondary | ICD-10-CM | POA: Diagnosis not present

## 2021-12-26 DIAGNOSIS — M6281 Muscle weakness (generalized): Secondary | ICD-10-CM | POA: Diagnosis not present

## 2021-12-26 DIAGNOSIS — R262 Difficulty in walking, not elsewhere classified: Secondary | ICD-10-CM

## 2021-12-26 DIAGNOSIS — M25562 Pain in left knee: Secondary | ICD-10-CM

## 2021-12-26 DIAGNOSIS — Z79899 Other long term (current) drug therapy: Secondary | ICD-10-CM | POA: Diagnosis not present

## 2021-12-26 DIAGNOSIS — E559 Vitamin D deficiency, unspecified: Secondary | ICD-10-CM | POA: Diagnosis not present

## 2021-12-26 DIAGNOSIS — I4891 Unspecified atrial fibrillation: Secondary | ICD-10-CM | POA: Diagnosis not present

## 2021-12-26 DIAGNOSIS — Z0000 Encounter for general adult medical examination without abnormal findings: Secondary | ICD-10-CM | POA: Diagnosis not present

## 2021-12-26 NOTE — Therapy (Signed)
OUTPATIENT PHYSICAL THERAPY TREATMENT   Patient Name: Bridget [name] MRN: [id number] DOB:11/17/1945, 76 y.o., female Today's Date: 12/26/2021   END OF SESSION:   PT End of Session - 12/26/21 1344     Visit Number 26    Number of Visits 36    Date for PT Re-Evaluation 01/20/22    Progress Note Due on Visit 28    PT Start Time 1339    PT Stop Time 1427    PT Time Calculation (min) 48 min    Activity Tolerance Patient tolerated treatment well;No increased pain;Patient limited by pain    Behavior During Therapy [facility] for tasks assessed/performed             Past Medical History:  Diagnosis Date   Arthritis    Cancer ([place])    basal cell cancer removed on face   Hypertension    Seasonal allergies    Sleep apnea    Past Surgical History:  Procedure Laterality Date   BREAST EXCISIONAL BIOPSY Left    CHOLECYSTECTOMY  02/2010   KNEE SURGERY Right    Meniscal tear   OOPHORECTOMY  01/2003   RSO AT [facility]   TOTAL KNEE ARTHROPLASTY Left 10/18/2021   Procedure: LEFT TOTAL KNEE ARTHROPLASTY;  Surgeon: Mcarthur Rossetti, MD;  Location: [facility];  Service: Orthopedics;  Laterality: Left;   VAGINAL HYSTERECTOMY  01/2003   TVH,RSO A&P REPAIR   WISDOM TOOTH EXTRACTION     Patient Active Problem List   Diagnosis Date Noted   Status post total left knee replacement 10/18/2021   Degenerative arthritis of left knee 11/24/2019   Secondary hypercoagulable state ([place]) 01/26/2019   Greater trochanteric bursitis of right hip 09/23/2018   Lumbar radiculopathy 08/12/2018   Gluteal tendonitis of right buttock 07/29/2018   Atrial fibrillation ([place]) 07/21/2018   Arthritis of left acromioclavicular joint 04/26/2018   Partial nontraumatic tear of left rotator cuff 04/26/2018   Hypertension    Seasonal allergies     THERAPY DIAG:  Muscle weakness (generalized)  Localized edema  Difficulty in walking, not elsewhere classified  Left knee pain, unspecified chronicity  Stiffness of  left knee, not elsewhere classified  PCP: Jonathon Jordan, MD   REFERRING PROVIDER: Mcarthur Rossetti, MD   REFERRING DIAG: [phone] (ICD-10-CM) - Unilateral primary osteoarthritis, left knee    EVAL THERAPY DIAG:  Difficulty in walking, not elsewhere classified   Localized edema   Muscle weakness (generalized)   Stiffness of left knee, not elsewhere classified   Left knee pain, unspecified chronicity   Rationale for Evaluation and Treatment Rehabilitation   ONSET DATE: Surgery 10/18/2021   SUBJECTIVE:    SUBJECTIVE STATEMENT: [name] notes she is very swollen today as she has been on her feet for 3 hours.  PERTINENT HISTORY: OA, skin cancer, HTN, previous Rt knee scope, atrial fibrillation  PAIN:  NPRS scale: 1-4/10 this week Pain location: Lt knee Pain description: tightness, throbbing, aching  Aggravating factors: prolonged weight-bearing, colder weather  Relieving factors: ice, heat, pain meds, elevation    PRECAUTIONS: None   WEIGHT BEARING RESTRICTIONS: No   FALLS:  Has patient fallen in last 6 months? No   LIVING ENVIRONMENT: Lives with: lives with their spouse Lives in: House/apartment Stairs: 1 stair Has following equipment at home: Environmental consultant - 2 wheeled   OCCUPATION: Retired   PLOF: Independent   PATIENT GOALS: Return to gardening, visiting grand kids     OBJECTIVE:    DIAGNOSTIC FINDINGS:  10/31/2021 review  of FINDINGS: Changes of left knee replacement. No hardware or bony complicating feature. Soft tissue and joint space gas noted.   PATIENT SURVEYS:  12/?/2023 FOTO  (Goal 60)  11/20/2021  FOTO 31 (Goal 60)  10/31/2021 FOTO 38 (Goal 60 in 15 visits)   COGNITION: 10/31/2021 Overall cognitive status: Within functional limits for tasks assessed                         SENSATION: 10/31/2021 No complaints of peripheral pain or paresthesias   EDEMA:  12/05/2021:   10/31/2021 Noted but not objectively assessed     LOWER  EXTREMITY ROM:   Active ROM Right 10/31/2021 Left 10/31/2021 Left 11/01/2021 Left 11/13/2021 Left 11/20/2021 Left 11/25/21 Left 11/27/2021 Left 12/05/2021 Left 12/12/21 Left 12/17/21 Left 12/18/21 Left 12/19/21 Left 12/20/21 Left 12/26/21  Knee flexion 112 40 50 58 62 62 73 PROM 65 AROM in supine heel slide (68 PROM) AA 96 Active 91 (Supine heel slide) Active 91 AA 96 (Supine heel slide)  A: 92 AA: 96 Active 94 AA: 100 Active 100  Knee extension 0 -3 -3 -3 -2 -3 (LAQ) -2 PROM  -12 AROM in seated LAQ -5 (seated LAQ) -1 (seated LAQ) -1 (seated LAQ) -1  -3  BOLD MEASUREMENTS POST MANIP  (Blank rows = not tested)   LOWER EXTREMITY MMT:   MMT Right 10/31/2021 Left 10/31/2021 Left 12/05/2021 Left/Right in pounds 12/26/2021  Knee flexion        Knee extension 4/5 2/5 3+/5 25.1/29.2  BOLD MEASUREMENTS POST MANIP   (Blank rows = not tested)   GAIT: 12/05/2021 : FWW use mod independence  10/31/2021 Distance walked: 50 feet Assistive device utilized: Environmental consultant - 2 wheeled Level of assistance:  Needs walker to reduce L knee WB Comments: Discussed the need for continued AD use to manage edema    TODAY'S TREATMENT  12/26/2021 Recumbent bike AAROM 5 minutes Seat 5 Tailgate knee flexion 1 minute AAROM Left knee flexion (PT overpressure) 10X 10 seconds  Functional Activities Single-Leg Press Lt only 2 sets of 10 at 50# (full extension and go as deep into flexion as possible, able to get all the way to the bumper by the 2nd set) Step-up and over 4, 6 and 8 inch step, emphasis on slow eccentrics and avoiding circumduction   12/23/2021: Therex Recumbent bike seat 5 partial to full revolutions 9 mins Seated Lt LAQ 5 lbs c pause in end ranges each way 2 x 15 Leg press double leg 87 lbs x 15, Lt leg only 2 x 15 43 lbs    Manual: Seated Lt knee flexion c distraction/IR mobilization c movement.  Overpressure into flexion stretching   12/20/2021 Recumbent bike Seat 8-6 for AAROM 8  minutes Leg Press 87# 3x10; LLE 43# 2x10 LAQ Lt 5# 2x10; 5 sec hold LLLD heel slide with incremental flexion increases x 4 min, pt using strap  Manual Seated Lt knee flexion PROM to tolerance, min cues to decrease hip hike  PATIENT EDUCATION:  11/04/2021 Education details: HEP update Person educated: Patient Education method: Explanation, Demonstration, Tactile cues, Verbal cues, and Handouts Education comprehension: verbalized understanding, returned demonstration, verbal cues required, tactile cues required   HOME EXERCISE PROGRAM: Access Code: [email or web address] URL: [email or web address] Date: 11/04/2021 Prepared by: Scot Jun  Exercises - Supine Quadricep Sets  - 5 x daily - 7 x weekly - 2 sets - 10 reps - 5 second hold - Seated Knee Flexion AAROM  - 5  x daily - 7 x weekly - 1 sets - 1 reps - 3 minutes hold - Seated Long Arc Quad (Mirrored)  - 3-5 x daily - 7 x weekly - 1-2 sets - 10 reps - 2 hold - Ankle Pumps in Elevation  - 2-3 x daily - 7 x weekly - 1-2 sets - 10 reps   ASSESSMENT:   CLINICAL IMPRESSION: Korianna continues to be concerned about her left knee edema.  Despite this, she was able to achieve 100 degrees of knee flexion actively.  Because of the increased edema today she was lacking 3 degrees of extension, whereas a 1 degree extension deficit is typical for her.  We discussed how continued quadriceps strength work should continue to improve stability of the joint, gait quality and place her in an environment where she can do more without increasing pressure and edema in the joint.  We also discussed transitioning into a 1 time per week program for the next 3 to 4 weeks with the goal of transitioning her into independent rehabilitation at 0 to 105 degrees, independent with a cane and home exercise program.   OBJECTIVE IMPAIRMENTS: Abnormal gait, decreased activity tolerance, decreased balance, decreased coordination, decreased endurance, decreased knowledge  of condition, difficulty walking, decreased ROM, decreased strength, decreased safety awareness, increased edema, impaired perceived functional ability, obesity, and pain.    ACTIVITY LIMITATIONS: carrying, bending, sitting, standing, squatting, stairs, bed mobility, dressing, and locomotion level   PARTICIPATION LIMITATIONS: cleaning, driving, shopping, community activity, and yard work   PERSONAL FACTORS: OA, skin cancer, HTN, previous Rt knee scope, atrial fibrillation are also affecting patient's functional outcome.    REHAB POTENTIAL: Good   CLINICAL DECISION MAKING: Stable/uncomplicated   EVALUATION COMPLEXITY: Low     GOALS: Goals reviewed with patient? Yes    SHORT TERM GOALS: Target date: 11/28/2021  Kamie will improve Lt knee flexion AROM to 90 degrees Baseline: 40 degrees Goal status: (MET 12/12/21)   2.  Ollie will be independent and compliant 5/7 days with her starter HEP for 2 weeks Baseline:  Goal status: Met 11/13/2021     LONG TERM GOALS: Target date: 01/09/2022    Improve FOTO to 60 Baseline: 38 Goal status: Ongoing 12/12/21   2.  Adalynne will report Lt knee pain consistently 3/10 or better on the VAS Baseline: Can be 7/10 Goal status: On Going 12/26/2021   3.  Improve Lt knee AROM to 2-0-105 or better Baseline: 3-0-40 Goal status: Partially Met 12/26/2021   4.  Improve Lt knee strength as assessed by MMT, hand-held dynamometer and transition to a cane or no AD Baseline: Walker and poor (2/5 MMT) strength Goal status: On Going 12/26/2021   5.  Charrie will be compliant and independent with her long-term HEP at DC Baseline: Started 10/31/2021 Goal status: On Going 12/26/2021     PLAN:   PT FREQUENCY:  2-3x/week   PT DURATION: 10 weeks   PLANNED INTERVENTIONS: Therapeutic exercises, Therapeutic activity, Neuromuscular re-education, Balance training, Gait training, Patient/Family education, Self Care, Joint mobilization, Stair training, Electrical  stimulation, Cryotherapy, Vasopneumatic device, and Manual therapy   PLAN FOR NEXT SESSION:  Flexion AROM, functional quadriceps strengthening. Dynamic balance as needed.   Farley Ly PT, MPT 12/26/21  4:17 PM

## 2022-01-07 DIAGNOSIS — H43813 Vitreous degeneration, bilateral: Secondary | ICD-10-CM | POA: Diagnosis not present

## 2022-01-07 DIAGNOSIS — H2513 Age-related nuclear cataract, bilateral: Secondary | ICD-10-CM | POA: Diagnosis not present

## 2022-01-07 DIAGNOSIS — H35373 Puckering of macula, bilateral: Secondary | ICD-10-CM | POA: Diagnosis not present

## 2022-01-09 ENCOUNTER — Encounter: Payer: Self-pay | Admitting: Rehabilitative and Restorative Service Providers"

## 2022-01-09 ENCOUNTER — Ambulatory Visit (INDEPENDENT_AMBULATORY_CARE_PROVIDER_SITE_OTHER): Payer: Medicare Other | Admitting: Rehabilitative and Restorative Service Providers"

## 2022-01-09 DIAGNOSIS — R600 Localized edema: Secondary | ICD-10-CM

## 2022-01-09 DIAGNOSIS — M6281 Muscle weakness (generalized): Secondary | ICD-10-CM

## 2022-01-09 DIAGNOSIS — M25562 Pain in left knee: Secondary | ICD-10-CM | POA: Diagnosis not present

## 2022-01-09 DIAGNOSIS — M25662 Stiffness of left knee, not elsewhere classified: Secondary | ICD-10-CM | POA: Diagnosis not present

## 2022-01-09 DIAGNOSIS — R262 Difficulty in walking, not elsewhere classified: Secondary | ICD-10-CM | POA: Diagnosis not present

## 2022-01-09 NOTE — Therapy (Signed)
OUTPATIENT PHYSICAL THERAPY TREATMENT/PROGRESS NOTE   Patient Name: Bridget [name] MRN: [id number] DOB:12/01/1945, 77 y.o., female Today's Date: 01/09/2022  Progress Note Reporting Period 10/31/2021 to 01/09/2022  See note below for Objective Data and Assessment of Progress/Goals.     END OF SESSION:   PT End of Session - 01/09/22 1428     Visit Number 27    Number of Visits 36    Date for PT Re-Evaluation 01/20/22    Progress Note Due on Visit 27    PT Start Time 1346    PT Stop Time 1429    PT Time Calculation (min) 43 min    Activity Tolerance Patient tolerated treatment well;No increased pain    Behavior During Therapy WFL for tasks assessed/performed             Past Medical History:  Diagnosis Date   Arthritis    Cancer ([place])    basal cell cancer removed on face   Hypertension    Seasonal allergies    Sleep apnea    Past Surgical History:  Procedure Laterality Date   BREAST EXCISIONAL BIOPSY Left    CHOLECYSTECTOMY  02/2010   KNEE SURGERY Right    Meniscal tear   OOPHORECTOMY  01/2003   RSO AT [facility]   TOTAL KNEE ARTHROPLASTY Left 10/18/2021   Procedure: LEFT TOTAL KNEE ARTHROPLASTY;  Surgeon: Mcarthur Rossetti, MD;  Location: [facility];  Service: Orthopedics;  Laterality: Left;   VAGINAL HYSTERECTOMY  01/2003   [facility],RSO A&P REPAIR   WISDOM TOOTH EXTRACTION     Patient Active Problem List   Diagnosis Date Noted   Status post total left knee replacement 10/18/2021   Degenerative arthritis of left knee 11/24/2019   Secondary hypercoagulable state ([place]) 01/26/2019   Greater trochanteric bursitis of right hip 09/23/2018   Lumbar radiculopathy 08/12/2018   Gluteal tendonitis of right buttock 07/29/2018   Atrial fibrillation ([place]) 07/21/2018   Arthritis of left acromioclavicular joint 04/26/2018   Partial nontraumatic tear of left rotator cuff 04/26/2018   Hypertension    Seasonal allergies     THERAPY DIAG:  Muscle weakness (generalized) - Plan:  PT plan of care cert/re-cert  Localized edema - Plan: PT plan of care cert/re-cert  Difficulty in walking, not elsewhere classified - Plan: PT plan of care cert/re-cert  Left knee pain, unspecified chronicity - Plan: PT plan of care cert/re-cert  Stiffness of left knee, not elsewhere classified - Plan: PT plan of care cert/re-cert  PCP: Jonathon Jordan, MD   REFERRING PROVIDER: Mcarthur Rossetti, MD   REFERRING DIAG: [phone] (ICD-10-CM) - Unilateral primary osteoarthritis, left knee    EVAL THERAPY DIAG:  Difficulty in walking, not elsewhere classified   Localized edema   Muscle weakness (generalized)   Stiffness of left knee, not elsewhere classified   Left knee pain, unspecified chronicity   Rationale for Evaluation and Treatment Rehabilitation   ONSET DATE: Surgery 10/18/2021   SUBJECTIVE:    SUBJECTIVE STATEMENT: Taneshia has been using the cane outside the home only.  AROM, gait quality and overall function are significantly improved.  PERTINENT HISTORY: OA, skin cancer, HTN, previous Rt knee scope, atrial fibrillation  PAIN:  NPRS scale: 0-3/10 this week Pain location: Lt knee Pain description: tightness, throbbing, aching  Aggravating factors: prolonged weight-bearing, colder weather  Relieving factors: ice, heat, pain meds, elevation    PRECAUTIONS: None   WEIGHT BEARING RESTRICTIONS: No   FALLS:  Has patient fallen in last 6 months?  No   LIVING ENVIRONMENT: Lives with: lives with their spouse Lives in: House/apartment Stairs: 1 stair Has following equipment at home: Environmental consultant - 2 wheeled   OCCUPATION: Retired   PLOF: Independent   PATIENT GOALS: Return to gardening, visiting grand kids     OBJECTIVE:    DIAGNOSTIC FINDINGS:  10/31/2021 review of FINDINGS: Changes of left knee replacement. No hardware or bony complicating feature. Soft tissue and joint space gas noted.   PATIENT SURVEYS:  01/09/2022 FOTO 55 (Goal 60)  11/20/2021  FOTO  31 (Goal 60)  10/31/2021 FOTO 38 (Goal 60 in 15 visits)   COGNITION: 10/31/2021 Overall cognitive status: Within functional limits for tasks assessed                         SENSATION: 10/31/2021 No complaints of peripheral pain or paresthesias   EDEMA:  12/05/2021:   10/31/2021 Noted but not objectively assessed     LOWER EXTREMITY ROM:   Active ROM Right 10/31/2021 Left 10/31/2021 Left 11/01/2021 Left 11/13/2021 Left 11/20/2021 Left 11/25/21 Left 11/27/2021 Left 12/05/2021 Left 12/12/21 Left 12/17/21 Left 12/18/21 Left 12/19/21 Left 12/20/21 Left 12/26/21 Left 01/09/2022  Knee flexion 112 40 50 58 62 62 73 PROM 65 AROM in supine heel slide (68 PROM) AA 96 Active 91 (Supine heel slide) Active 91 AA 96 (Supine heel slide)  A: 92 AA: 96 Active 94 AA: 100 Active 100 Active 103  Knee extension 0 -3 -3 -3 -2 -3 (LAQ) -2 PROM  -12 AROM in seated LAQ -5 (seated LAQ) -1 (seated LAQ) -1 (seated LAQ) -1  -3 0  BOLD MEASUREMENTS POST MANIP  (Blank rows = not tested)   LOWER EXTREMITY MMT:   MMT Right 10/31/2021 Left 10/31/2021 Left 12/05/2021 Left/Right in pounds 12/26/2021 Left/Right in pounds 01/09/2022  Knee flexion         Knee extension 4/5 2/5 3+/5 25.1/29.2 26.8/30.9  BOLD MEASUREMENTS POST MANIP   (Blank rows = not tested)   GAIT: 12/05/2021 : FWW use mod independence  10/31/2021 Distance walked: 50 feet Assistive device utilized: Environmental consultant - 2 wheeled Level of assistance:  Needs walker to reduce L knee WB Comments: Discussed the need for continued AD use to manage edema    TODAY'S TREATMENT  01/09/2022 Quadriceps sets 10X 5 seconds Seated straight leg raises 2 sets of 5 for 3 seconds Tailgate knee flexion 1 minute Left knee flexion AAROM 1 0X 10 seconds  Functional Activities: Sit to stand with slow eccentrics 5X Step-down 10X Bil off 4 inch step Step-up and over slow eccentrics off 8 inch step 10X   12/26/2021 Recumbent bike AAROM 5 minutes Seat  5 Tailgate knee flexion 1 minute AAROM Left knee flexion (PT overpressure) 10X 10 seconds  Functional Activities Single-Leg Press Lt only 2 sets of 10 at 50# (full extension and go as deep into flexion as possible, able to get all the way to the bumper by the 2nd set) Step-up and over 4, 6 and 8 inch step, emphasis on slow eccentrics and avoiding circumduction   12/23/2021: Therex Recumbent bike seat 5 partial to full revolutions 9 mins Seated Lt LAQ 5 lbs c pause in end ranges each way 2 x 15 Leg press double leg 87 lbs x 15, Lt leg only 2 x 15 43 lbs    Manual: Seated Lt knee flexion c distraction/IR mobilization c movement.  Overpressure into flexion stretching   PATIENT EDUCATION:  11/04/2021 Education details: HEP  update Person educated: Patient Education method: Explanation, Demonstration, Tactile cues, Verbal cues, and Handouts Education comprehension: verbalized understanding, returned demonstration, verbal cues required, tactile cues required   HOME EXERCISE PROGRAM: Access Code: [email or web address] URL: [email or web address] Date: 01/09/2022 Prepared by: Vista Mink  Exercises - Supine Quadricep Sets  - 3 x daily - 7 x weekly - 2 sets - 10 reps - 5 second hold - Seated Knee Flexion AAROM  - 1 x daily - 7 x weekly - 1 sets - 1 reps - 3 minutes hold - Small Range Straight Leg Raise  - 2-3 x daily - 3-4 x weekly - 2-3 sets - 5 reps - 3 seconds hold - Lateral Step Down  - 1 x daily - 3-4 x weekly - 3 sets - 10 reps   ASSESSMENT:   CLINICAL IMPRESSION: Even with less than the recommended home program compliance over the holidays, Neyah is moving great post TKA manipulation.  Active range of motion today was 0 to 103 degrees.  She is walking without an assistive device in the house and uses a cane outside the house part of the time.  We modified her home program today with an emphasis on quadriceps strengthening to continue her progression off of any assistive  device.  I recommend she attend 1 additional visit to review her home program changes with the addition of a little balance work before transferring her into independent rehabilitation.   OBJECTIVE IMPAIRMENTS: Abnormal gait, decreased activity tolerance, decreased balance, decreased coordination, decreased endurance, decreased knowledge of condition, difficulty walking, decreased ROM, decreased strength, decreased safety awareness, increased edema, impaired perceived functional ability, obesity, and pain.    ACTIVITY LIMITATIONS: carrying, bending, sitting, standing, squatting, stairs, bed mobility, dressing, and locomotion level   PARTICIPATION LIMITATIONS: cleaning, driving, shopping, community activity, and yard work   PERSONAL FACTORS: OA, skin cancer, HTN, previous Rt knee scope, atrial fibrillation are also affecting patient's functional outcome.    REHAB POTENTIAL: Good   CLINICAL DECISION MAKING: Stable/uncomplicated   EVALUATION COMPLEXITY: Low     GOALS: Goals reviewed with patient? Yes    SHORT TERM GOALS: Target date: 11/28/2021  Lareina will improve Lt knee flexion AROM to 90 degrees Baseline: 40 degrees Goal status: (MET 12/12/21)   2.  Wonder will be independent and compliant 5/7 days with her starter HEP for 2 weeks Baseline:  Goal status: Met 11/13/2021     LONG TERM GOALS: Target date: 01/09/2022    Improve FOTO to 60 Baseline: 38 Goal status: Ongoing 01/09/2022   2.  Amiracle will report Lt knee pain consistently 3/10 or better on the VAS Baseline: Can be 7/10 Goal status: Met 01/09/2022   3.  Improve Lt knee AROM to 2-0-105 or better Baseline: 3-0-40 Goal status: Partially Met 12/26/2021   4.  Improve Lt knee strength as assessed by MMT, hand-held dynamometer and transition to a cane or no AD Baseline: Walker and poor (2/5 MMT) strength Goal status: On Going 01/09/2022   5.  Betania will be compliant and independent with her long-term HEP at DC Baseline:  Started 10/31/2021 Goal status: On Going 01/09/2022     PLAN:   PT FREQUENCY:  1 time   PT DURATION: 1 visit   PLANNED INTERVENTIONS: Therapeutic exercises, Therapeutic activity, Neuromuscular re-education, Balance training, Gait training, Patient/Family education, Self Care, Joint mobilization, Stair training, Electrical stimulation, Cryotherapy, Vasopneumatic device, and Manual therapy   PLAN FOR NEXT SESSION:  Review strength changes and add balance before DC next  visit   Farley Ly PT, MPT 01/09/22  3:43 PM

## 2022-01-13 ENCOUNTER — Encounter: Payer: Medicare Other | Admitting: Rehabilitative and Restorative Service Providers"

## 2022-01-16 ENCOUNTER — Encounter: Payer: Self-pay | Admitting: Rehabilitative and Restorative Service Providers"

## 2022-01-16 ENCOUNTER — Ambulatory Visit (INDEPENDENT_AMBULATORY_CARE_PROVIDER_SITE_OTHER): Payer: Medicare Other | Admitting: Rehabilitative and Restorative Service Providers"

## 2022-01-16 DIAGNOSIS — M6281 Muscle weakness (generalized): Secondary | ICD-10-CM

## 2022-01-16 DIAGNOSIS — M25562 Pain in left knee: Secondary | ICD-10-CM

## 2022-01-16 DIAGNOSIS — R262 Difficulty in walking, not elsewhere classified: Secondary | ICD-10-CM

## 2022-01-16 DIAGNOSIS — R600 Localized edema: Secondary | ICD-10-CM | POA: Diagnosis not present

## 2022-01-16 DIAGNOSIS — M25662 Stiffness of left knee, not elsewhere classified: Secondary | ICD-10-CM

## 2022-01-16 NOTE — Therapy (Signed)
OUTPATIENT PHYSICAL THERAPY TREATMENT/DISCHARGE NOTE   Patient Name: Bridget [name] MRN: [id number] DOB:04-08-1945, 77 y.o., female Today's Date: 01/16/2022  PHYSICAL THERAPY DISCHARGE SUMMARY  Visits from Start of Care: 28  Current functional level related to goals / functional outcomes: See note   Remaining deficits: See note   Education / Equipment: Updated HEP   Patient agrees to discharge. Patient goals were met. Patient is being discharged due to being pleased with the current functional level.    END OF SESSION:   PT End of Session - 01/16/22 1436     Visit Number 28    Number of Visits 36    Date for PT Re-Evaluation 01/20/22    Progress Note Due on Visit 28    PT Start Time 1346    PT Stop Time 1433    PT Time Calculation (min) 47 min    Activity Tolerance Patient tolerated treatment well;No increased pain    Behavior During Therapy WFL for tasks assessed/performed             Past Medical History:  Diagnosis Date   Arthritis    Cancer ([place])    basal cell cancer removed on face   Hypertension    Seasonal allergies    Sleep apnea    Past Surgical History:  Procedure Laterality Date   BREAST EXCISIONAL BIOPSY Left    CHOLECYSTECTOMY  02/2010   KNEE SURGERY Right    Meniscal tear   OOPHORECTOMY  01/2003   RSO AT [facility]   TOTAL KNEE ARTHROPLASTY Left 10/18/2021   Procedure: LEFT TOTAL KNEE ARTHROPLASTY;  Surgeon: Mcarthur Rossetti, MD;  Location: [facility];  Service: Orthopedics;  Laterality: Left;   VAGINAL HYSTERECTOMY  01/2003   TVH,RSO A&P REPAIR   WISDOM TOOTH EXTRACTION     Patient Active Problem List   Diagnosis Date Noted   Status post total left knee replacement 10/18/2021   Degenerative arthritis of left knee 11/24/2019   Secondary hypercoagulable state ([place]) 01/26/2019   Greater trochanteric bursitis of right hip 09/23/2018   Lumbar radiculopathy 08/12/2018   Gluteal tendonitis of right buttock 07/29/2018   Atrial fibrillation  ([place]) 07/21/2018   Arthritis of left acromioclavicular joint 04/26/2018   Partial nontraumatic tear of left rotator cuff 04/26/2018   Hypertension    Seasonal allergies     THERAPY DIAG:  Muscle weakness (generalized)  Localized edema  Difficulty in walking, not elsewhere classified  Left knee pain, unspecified chronicity  Stiffness of left knee, not elsewhere classified  PCP: Jonathon Jordan, MD   REFERRING PROVIDER: Mcarthur Rossetti, MD   REFERRING DIAG: [phone] (ICD-10-CM) - Unilateral primary osteoarthritis, left knee    EVAL THERAPY DIAG:  Difficulty in walking, not elsewhere classified   Localized edema   Muscle weakness (generalized)   Stiffness of left knee, not elsewhere classified   Left knee pain, unspecified chronicity   Rationale for Evaluation and Treatment Rehabilitation   ONSET DATE: Surgery 10/18/2021   SUBJECTIVE:    SUBJECTIVE STATEMENT: Tierrah is no longer using the cane.  AROM, gait quality and overall function are significantly improved.  PERTINENT HISTORY: OA, skin cancer, HTN, previous Rt knee scope, atrial fibrillation  PAIN:  NPRS scale: 0-4/10 this week Pain location: Lt knee Pain description: tightness, throbbing, aching  Aggravating factors: prolonged weight-bearing, colder weather, can "catch" with flexion Relieving factors: ice, heat, pain meds, elevation    PRECAUTIONS: None   WEIGHT BEARING RESTRICTIONS: No   FALLS:  Has patient  fallen in last 6 months? No   LIVING ENVIRONMENT: Lives with: lives with their spouse Lives in: House/apartment Stairs: 1 stair Has following equipment at home: Environmental consultant - 2 wheeled   OCCUPATION: Retired   PLOF: Independent   PATIENT GOALS: Return to gardening, visiting grand kids     OBJECTIVE:    DIAGNOSTIC FINDINGS:  10/31/2021 review of FINDINGS: Changes of left knee replacement. No hardware or bony complicating feature. Soft tissue and joint space gas noted.   PATIENT  SURVEYS:  01/16/2022 FOTO 64 (Goal met)  01/09/2022 FOTO 55 (Goal 60)  11/20/2021  FOTO 31 (Goal 60)  10/31/2021 FOTO 38 (Goal 60 in 15 visits)   COGNITION: 10/31/2021 Overall cognitive status: Within functional limits for tasks assessed                         SENSATION: 10/31/2021 No complaints of peripheral pain or paresthesias   EDEMA:  12/05/2021:   10/31/2021 Noted but not objectively assessed     LOWER EXTREMITY ROM:   Active ROM Right 10/31/2021 Left 10/31/2021 Left 11/01/2021 Left 11/13/2021 Left 11/20/2021 Left 11/25/21 Left 11/27/2021 Left 12/05/2021 Left 12/12/21 Left 12/17/21 Left 12/18/21 Left 12/19/21 Left 12/20/21 Left 12/26/21 Left 01/09/2022 Left 01/16/2022  Knee flexion 112 40 50 58 62 62 73 PROM 65 AROM in supine heel slide (68 PROM) AA 96 Active 91 (Supine heel slide) Active 91 AA 96 (Supine heel slide)  A: 92 AA: 96 Active 94 AA: 100 Active 100 Active 103 Active 102  Knee extension 0 -3 -3 -3 -2 -3 (LAQ) -2 PROM  -12 AROM in seated LAQ -5 (seated LAQ) -1 (seated LAQ) -1 (seated LAQ) -1  -3 0 0  BOLD MEASUREMENTS POST MANIP  (Blank rows = not tested)   LOWER EXTREMITY MMT:   MMT Right 10/31/2021 Left 10/31/2021 Left 12/05/2021 Left/Right in pounds 12/26/2021 Left/Right in pounds 01/09/2022 Left/Right in pounds 01/16/2022  Knee flexion          Knee extension 4/5 2/5 3+/5 25.1/29.2 26.8/30.9 27.8/30.4  BOLD MEASUREMENTS POST MANIP   (Blank rows = not tested)   GAIT: 12/05/2021 : FWW use mod independence  10/31/2021 Distance walked: 50 feet Assistive device utilized: Environmental consultant - 2 wheeled Level of assistance:  Needs walker to reduce L knee WB Comments: Discussed the need for continued AD use to manage edema    TODAY'S TREATMENT  01/16/2022 Seated straight leg raises 2 sets of 5 for 3 seconds Tailgate knee flexion 1 minute Left knee flexion AAROM 10X 10 seconds  Neuromuscular re-education:  Tandem balance eyes open 5X 20 seconds  Bil  Functional Activities:  Step-up and over 8 inch step slow eccentrics 10X Step-down 10X Bil off 4 inch step slow eccentrics Sit to stand 5X slow eccentrics   01/09/2022 Quadriceps sets 10X 5 seconds Seated straight leg raises 2 sets of 5 for 3 seconds Tailgate knee flexion 1 minute Left knee flexion AAROM 1 0X 10 seconds  Functional Activities: Sit to stand with slow eccentrics 5X Step-down 10X Bil off 4 inch step Step-up and over slow eccentrics off 8 inch step 10X   12/26/2021 Recumbent bike AAROM 5 minutes Seat 5 Tailgate knee flexion 1 minute AAROM Left knee flexion (PT overpressure) 10X 10 seconds  Functional Activities Single-Leg Press Lt only 2 sets of 10 at 50# (full extension and go as deep into flexion as possible, able to get all the way to the bumper by the 2nd set)  Step-up and over 4, 6 and 8 inch step, emphasis on slow eccentrics and avoiding circumduction   PATIENT EDUCATION:  11/04/2021 Education details: HEP update Person educated: Patient Education method: Explanation, Demonstration, Tactile cues, Verbal cues, and Handouts Education comprehension: verbalized understanding, returned demonstration, verbal cues required, tactile cues required   HOME EXERCISE PROGRAM: Access Code: [email or web address] URL: [email or web address] Date: 01/09/2022 Prepared by: Vista Mink  Exercises - Supine Quadricep Sets  - 3 x daily - 7 x weekly - 2 sets - 10 reps - 5 second hold - Seated Knee Flexion AAROM  - 1 x daily - 7 x weekly - 1 sets - 1 reps - 3 minutes hold - Small Range Straight Leg Raise  - 2-3 x daily - 3-4 x weekly - 2-3 sets - 5 reps - 3 seconds hold - Lateral Step Down  - 1 x daily - 3-4 x weekly - 3 sets - 10 reps   ASSESSMENT:   CLINICAL IMPRESSION: AROM was 0-102 degrees today.  Strength is gradually improving and Wende is no longer using a cane.  We reviewed her home exercises which are addressing remaining AROM, strength and functional  deficits.  Keiri is welcome to return to supervised PT if progress with independent PT is not as anticipated.   OBJECTIVE IMPAIRMENTS: Abnormal gait, decreased activity tolerance, decreased balance, decreased coordination, decreased endurance, decreased knowledge of condition, difficulty walking, decreased ROM, decreased strength, decreased safety awareness, increased edema, impaired perceived functional ability, obesity, and pain.    ACTIVITY LIMITATIONS: carrying, bending, sitting, standing, squatting, stairs, bed mobility, dressing, and locomotion level   PARTICIPATION LIMITATIONS: cleaning, driving, shopping, community activity, and yard work   PERSONAL FACTORS: OA, skin cancer, HTN, previous Rt knee scope, atrial fibrillation are also affecting patient's functional outcome.    REHAB POTENTIAL: Good   CLINICAL DECISION MAKING: Stable/uncomplicated   EVALUATION COMPLEXITY: Low     GOALS: Goals reviewed with patient? Yes    SHORT TERM GOALS: Target date: 11/28/2021  Aries will improve Lt knee flexion AROM to 90 degrees Baseline: 40 degrees Goal status: (MET 12/12/21)   2.  Audia will be independent and compliant 5/7 days with her starter HEP for 2 weeks Baseline:  Goal status: Met 11/13/2021     LONG TERM GOALS: Target date: 01/09/2022    Improve FOTO to 60 Baseline: 38 Goal status: Met 01/16/2022   2.  Kyanna will report Lt knee pain consistently 3/10 or better on the VAS Baseline: Can be 7/10 Goal status: Met 01/09/2022   3.  Improve Lt knee AROM to 2-0-105 or better Baseline: 3-0-40 Goal status: Partially Met 01/16/2022   4.  Improve Lt knee strength as assessed by MMT, hand-held dynamometer and transition to a cane or no AD Baseline: Walker and poor (2/5 MMT) strength Goal status: Met 01/16/2022   5.  Johna will be compliant and independent with her long-term HEP at DC Baseline: Started 10/31/2021 Goal status: Met 01/16/2022     PLAN:   PT FREQUENCY:     PT  DURATION:    PLANNED INTERVENTIONS: Therapeutic exercises, Therapeutic activity, Neuromuscular re-education, Balance training, Gait training, Patient/Family education, Self Care, Joint mobilization, Stair training, Electrical stimulation, Cryotherapy, Vasopneumatic device, and Manual therapy   PLAN FOR NEXT SESSION:  DC today   Farley Ly PT, MPT 01/16/22  2:41 PM

## 2022-01-22 ENCOUNTER — Encounter: Payer: Medicare Other | Admitting: Physical Therapy

## 2022-01-22 ENCOUNTER — Ambulatory Visit (INDEPENDENT_AMBULATORY_CARE_PROVIDER_SITE_OTHER): Payer: Medicare Other | Admitting: Orthopaedic Surgery

## 2022-01-22 ENCOUNTER — Encounter: Payer: Self-pay | Admitting: Orthopaedic Surgery

## 2022-01-22 ENCOUNTER — Telehealth: Payer: Self-pay | Admitting: *Deleted

## 2022-01-22 DIAGNOSIS — Z96652 Presence of left artificial knee joint: Secondary | ICD-10-CM

## 2022-01-22 NOTE — Progress Notes (Signed)
The patient is now just past 3 months status post a left total knee arthroplasty.  She is an active 77 year old female.  Her postoperative course was complicated by arthrofibrosis and she did require manipulation under anesthesia.  She reports some stiffness and swelling but overall she is very satisfied.  She reports improved strength and range of motion.  She is walking without assistive ice.  She has no restrictions at this point and can even drive.  Her range of motion has improved significantly with almost full extension and much better flexion as well.  The next time I need to see her is not for 3 months.  Will have a standing AP and lateral of her left operative knee at that visit.  All questions and concerns were addressed and answered.

## 2022-01-22 NOTE — Telephone Encounter (Signed)
Ortho bundle 90 day call completed. 

## 2022-02-04 ENCOUNTER — Other Ambulatory Visit: Payer: Self-pay | Admitting: Cardiology

## 2022-02-04 MED ORDER — POTASSIUM CHLORIDE CRYS ER 20 MEQ PO TBCR: 20.0000 meq | EXTENDED_RELEASE_TABLET | Freq: Every day | ORAL | 3 refills | Status: DC

## 2022-02-24 ENCOUNTER — Other Ambulatory Visit: Payer: Self-pay | Admitting: Cardiology

## 2022-02-24 DIAGNOSIS — I480 Paroxysmal atrial fibrillation: Secondary | ICD-10-CM

## 2022-02-24 NOTE — Telephone Encounter (Signed)
Prescription refill request for Eliquis received. Indication: a fib Last office visit: 10/09/21 Scr: 0.82 Age: 77 Weight: 103kg

## 2022-03-11 ENCOUNTER — Telehealth: Payer: Self-pay

## 2022-03-11 NOTE — Telephone Encounter (Signed)
Ailene Ravel with dental office LMVM of triage phone asking when patient would be cleared for dental procedure and if Dr Ninfa Linden required pre med-please call her back to advise [phone]

## 2022-03-11 NOTE — Telephone Encounter (Signed)
Only for 1st 3 months. Tried calling back but no answer and no way to leave voice mail

## 2022-03-12 NOTE — Telephone Encounter (Signed)
Tried calling again-unable to reach anybody

## 2022-03-13 ENCOUNTER — Encounter: Payer: Self-pay | Admitting: Radiology

## 2022-04-12 DIAGNOSIS — N390 Urinary tract infection, site not specified: Secondary | ICD-10-CM | POA: Diagnosis not present

## 2022-04-23 ENCOUNTER — Encounter: Payer: Self-pay | Admitting: Orthopaedic Surgery

## 2022-04-23 ENCOUNTER — Other Ambulatory Visit (INDEPENDENT_AMBULATORY_CARE_PROVIDER_SITE_OTHER): Payer: Medicare Other

## 2022-04-23 ENCOUNTER — Ambulatory Visit (INDEPENDENT_AMBULATORY_CARE_PROVIDER_SITE_OTHER): Payer: Medicare Other | Admitting: Orthopaedic Surgery

## 2022-04-23 DIAGNOSIS — Z96652 Presence of left artificial knee joint: Secondary | ICD-10-CM

## 2022-04-23 NOTE — Progress Notes (Signed)
The patient is a 77 year old female who is now 6 months status post a left total knee arthroplasty.  She says she is doing well with her range of motion and strength.  She is still participating in a home exercise program.  She is satisfied overall.  Examination of her left knee still shows some swelling to be expected but overall her range of motion is good and the knee feels ligamentously stable.  2 views of the left knee show well-seated total knee arthroplasty with no complicating features.  At this point follow-up for her left knee can be as needed.  If she does develop any issues at all she knows to let us know.  All questions and concerns were addressed and answered.

## 2022-04-30 DIAGNOSIS — N390 Urinary tract infection, site not specified: Secondary | ICD-10-CM | POA: Diagnosis not present

## 2022-05-01 DIAGNOSIS — N390 Urinary tract infection, site not specified: Secondary | ICD-10-CM | POA: Diagnosis not present

## 2022-05-06 ENCOUNTER — Ambulatory Visit (INDEPENDENT_AMBULATORY_CARE_PROVIDER_SITE_OTHER): Payer: Medicare Other

## 2022-05-06 ENCOUNTER — Ambulatory Visit (INDEPENDENT_AMBULATORY_CARE_PROVIDER_SITE_OTHER): Payer: Medicare Other | Admitting: Family Medicine

## 2022-05-06 VITALS — BP 118/82 | HR 64 | Ht 64.0 in | Wt 233.0 lb

## 2022-05-06 DIAGNOSIS — G5702 Lesion of sciatic nerve, left lower limb: Secondary | ICD-10-CM | POA: Diagnosis not present

## 2022-05-06 DIAGNOSIS — M25552 Pain in left hip: Secondary | ICD-10-CM | POA: Diagnosis not present

## 2022-05-06 DIAGNOSIS — M5416 Radiculopathy, lumbar region: Secondary | ICD-10-CM

## 2022-05-06 DIAGNOSIS — M545 Low back pain, unspecified: Secondary | ICD-10-CM | POA: Diagnosis not present

## 2022-05-06 MED ORDER — PREDNISONE 50 MG PO TABS: ORAL_TABLET | ORAL | 0 refills | Status: DC

## 2022-05-06 MED ORDER — GABAPENTIN 100 MG PO CAPS: 100.0000 mg | ORAL_CAPSULE | Freq: Three times a day (TID) | ORAL | 3 refills | Status: DC | PRN

## 2022-05-06 NOTE — Progress Notes (Signed)
Rubin Payor, PhD, LAT, ATC acting as a scribe for Clementeen Graham, MD.  Bridget [name] is a 77 y.o. female who presents to [facility] at [facility] today for low back pain. Pt was previously seen by Dr. Katrinka Blazing for this issue on 05/11/20. Her last ESI was on 04/12/20.  Today, pt reports she was doing quad crunches while watching tv and feels like she lost count and maybe over did it. Pt locates pain to Left lower glute and radiates down her left leg. Patient is using a cane because if she has to stand she needs some help   Radiating pain: yes LE numbness/tingling: no but does have burning sensation in low glute and left claf LE weakness: no Aggravates: to extend leg all the way Treatments tried:ice, heat, Voltaren, apsercream   Dx imaging: 10/07/18 L-spine MRI  08/12/18 L-spine XR  Pertinent review of systems: No fevers or chills  Relevant historical information: A-fib.  History of left knee replacement about 6 months ago.   Exam:  BP 118/82   Pulse 64   Ht 5\' 4"  (1.626 m)   Wt 233 lb (105.7 kg)   LMP 01/07/2003   SpO2 96%   BMI 39.99 kg/m  General: Well Developed, well nourished, and in no acute distress.   MSK: L-spine normal appearing Nontender palpation.  Normal lumbar motion.  Positive left-sided slump test. Reflexes and strength are intact except noted below.  Left hip: Normal-appearing Normal motion. Tender palpation greater trochanter.  Hip abduction and external rotation strength are diminished 4/5.  Resisted hip external rotation produces pain radiating down the posterior leg into the posterior calf.  Pulses capillary fill and sensation are intact distally.    Lab and Radiology Results  X-ray images lumbar spine and left hip obtained today personally and independently interpreted  L-spine: DDD L5-S1.  Anterolisthesis L4-5.  Degenerative changes also significant at lower portion thoracic spine visible on lumbar spine x-ray.  No acute  fractures are visible.  Left hip: Mild left hip osteoarthritis.  No acute fractures are visible.  Enthesiopathy changes present at greater trochanter.  No acute fractures are visible  Await formal radiology review     Assessment and Plan: 77 y.o. female with left buttocks pain and lateral hip pain with pain radiating down the leg in an S1 pattern.  Pain is consistent with both piriformis syndrome and lumbosacral radiculopathy at S1.  Based on her physical exam piriformis syndrome is more likely.  After discussion plan for trial of physical therapy including strengthening and iontophoresis.  Will use a short course of prednisone today.  Also will try gabapentin at relatively low doses.  Consider steroid injection in the future if needed. Recheck in 1 month.  PDMP not reviewed this encounter. Orders Placed This Encounter  Procedures   DG HIP UNILAT W OR W/O PELVIS 2-3 VIEWS LEFT    Standing Status:   Future    Number of Occurrences:   1    Standing Expiration Date:   06/05/2022    Order Specific Question:   Reason for Exam (SYMPTOM  OR DIAGNOSIS REQUIRED)    Answer:   left hip pain    Order Specific Question:   Preferred imaging location?    Answer:   [facility]   DG Lumbar Spine 2-3 Views    Standing Status:   Future    Number of Occurrences:   1    Standing Expiration Date:   06/05/2022  Order Specific Question:   Reason for Exam (SYMPTOM  OR DIAGNOSIS REQUIRED)    Answer:   low back pain    Order Specific Question:   Preferred imaging location?    Answer:   [facility]   Ambulatory referral to Physical Therapy    Referral Priority:   Routine    Referral Type:   Physical Medicine    Referral Reason:   Specialty Services Required    Requested Specialty:   Physical Therapy    Number of Visits Requested:   1   Meds ordered this encounter  Medications   gabapentin (NEURONTIN) 100 MG capsule    Sig: Take 1-3 capsules (100-300 mg total) by mouth 3 (three) times  daily as needed.    Dispense:  30 capsule    Refill:  3   predniSONE (DELTASONE) 50 MG tablet    Sig: Take 1 pill daily for 5 days    Dispense:  1 tablet    Refill:  0     Discussed warning signs or symptoms. Please see discharge instructions. Patient expresses understanding.   The above documentation has been reviewed and is accurate and complete Clementeen Graham, M.D.

## 2022-05-06 NOTE — Patient Instructions (Addendum)
Thank you for coming in today.   Please get an Xray today before you leave   I've referred you to Physical Therapy.  Let us know if you don't hear from them in one week.   I've sent a prescription for Prednisone & Gabapentin to your pharmacy.   Check back in 1 month

## 2022-05-10 ENCOUNTER — Emergency Department (HOSPITAL_BASED_OUTPATIENT_CLINIC_OR_DEPARTMENT_OTHER): Payer: Medicare Other

## 2022-05-10 ENCOUNTER — Encounter (HOSPITAL_BASED_OUTPATIENT_CLINIC_OR_DEPARTMENT_OTHER): Payer: Self-pay | Admitting: Emergency Medicine

## 2022-05-10 ENCOUNTER — Emergency Department (HOSPITAL_BASED_OUTPATIENT_CLINIC_OR_DEPARTMENT_OTHER)
Admission: EM | Admit: 2022-05-10 | Discharge: 2022-05-10 | Disposition: A | Discharge status: Home / Self Care | Payer: Medicare Other | Attending: Emergency Medicine | Admitting: Emergency Medicine

## 2022-05-10 DIAGNOSIS — R002 Palpitations: Secondary | ICD-10-CM | POA: Diagnosis present

## 2022-05-10 DIAGNOSIS — R0602 Shortness of breath: Secondary | ICD-10-CM | POA: Diagnosis not present

## 2022-05-10 DIAGNOSIS — I480 Paroxysmal atrial fibrillation: Secondary | ICD-10-CM | POA: Diagnosis not present

## 2022-05-10 DIAGNOSIS — Z79899 Other long term (current) drug therapy: Secondary | ICD-10-CM | POA: Insufficient documentation

## 2022-05-10 DIAGNOSIS — Z7901 Long term (current) use of anticoagulants: Secondary | ICD-10-CM | POA: Insufficient documentation

## 2022-05-10 DIAGNOSIS — I10 Essential (primary) hypertension: Secondary | ICD-10-CM | POA: Insufficient documentation

## 2022-05-10 LAB — CBC WITH DIFFERENTIAL/PLATELET
Abs Immature Granulocytes: 0.08 10*3/uL — ABNORMAL HIGH (ref 0.00–0.07)
Basophils Absolute: 0 10*3/uL (ref 0.0–0.1)
Basophils Relative: 0 %
Eosinophils Absolute: 0 10*3/uL (ref 0.0–0.5)
Eosinophils Relative: 0 %
HCT: 44.7 % (ref 36.0–46.0)
Hemoglobin: 14.5 g/dL (ref 12.0–15.0)
Immature Granulocytes: 1 %
Lymphocytes Relative: 10 %
Lymphs Abs: 0.9 10*3/uL (ref 0.7–4.0)
MCH: 26.1 pg (ref 26.0–34.0)
MCHC: 32.4 g/dL (ref 30.0–36.0)
MCV: 80.4 fL (ref 80.0–100.0)
Monocytes Absolute: 0.4 10*3/uL (ref 0.1–1.0)
Monocytes Relative: 4 %
Neutro Abs: 7.6 10*3/uL (ref 1.7–7.7)
Neutrophils Relative %: 85 %
Platelets: 352 10*3/uL (ref 150–400)
RBC: 5.56 MIL/uL — ABNORMAL HIGH (ref 3.87–5.11)
RDW: 15.7 % — ABNORMAL HIGH (ref 11.5–15.5)
WBC: 9 10*3/uL (ref 4.0–10.5)
nRBC: 0 % (ref 0.0–0.2)

## 2022-05-10 LAB — URINALYSIS, ROUTINE W REFLEX MICROSCOPIC
Bilirubin Urine: NEGATIVE
Glucose, UA: 100 mg/dL — AB
Hgb urine dipstick: NEGATIVE
Ketones, ur: NEGATIVE mg/dL
Leukocytes,Ua: NEGATIVE
Nitrite: NEGATIVE
Protein, ur: NEGATIVE mg/dL
Specific Gravity, Urine: 1.012 (ref 1.005–1.030)
pH: 6.5 (ref 5.0–8.0)

## 2022-05-10 LAB — MAGNESIUM: Magnesium: 2.1 mg/dL (ref 1.7–2.4)

## 2022-05-10 LAB — TROPONIN I (HIGH SENSITIVITY)
Troponin I (High Sensitivity): 6 ng/L (ref <18)
Troponin I (High Sensitivity): 9 ng/L (ref <18)

## 2022-05-10 LAB — BASIC METABOLIC PANEL
Anion gap: 14 (ref 5–15)
BUN: 17 mg/dL (ref 8–23)
CO2: 21 mmol/L — ABNORMAL LOW (ref 22–32)
Calcium: 9.4 mg/dL (ref 8.9–10.3)
Chloride: 103 mmol/L (ref 98–111)
Creatinine, Ser: 0.76 mg/dL (ref 0.44–1.00)
GFR, Estimated: >60 mL/min (ref >60)
Glucose, Bld: 254 mg/dL — ABNORMAL HIGH (ref 70–99)
Potassium: 4.2 mmol/L (ref 3.5–5.1)
Sodium: 138 mmol/L (ref 135–145)

## 2022-05-10 LAB — PROTIME-INR
INR: 1.3 — ABNORMAL HIGH (ref 0.8–1.2)
Prothrombin Time: 15.9 seconds — ABNORMAL HIGH (ref 11.4–15.2)

## 2022-05-10 LAB — CBG MONITORING, ED: Glucose-Capillary: 151 mg/dL — ABNORMAL HIGH (ref 70–99)

## 2022-05-10 LAB — BRAIN NATRIURETIC PEPTIDE: B Natriuretic Peptide: 224.9 pg/mL — ABNORMAL HIGH (ref 0.0–100.0)

## 2022-05-10 MED ORDER — DILTIAZEM LOAD VIA INFUSION
15.0000 mg | Freq: Once | INTRAVENOUS | Status: DC
  Filled 2022-05-10: qty 15

## 2022-05-10 MED ORDER — DILTIAZEM HCL-DEXTROSE 125-5 MG/125ML-% IV SOLN (PREMIX): 5.0000 mg/h | INTRAVENOUS | Status: DC

## 2022-05-10 NOTE — ED Notes (Signed)
Dc instructions reviewed with patient. Patient voiced understanding. Dc with belongings.  °

## 2022-05-10 NOTE — Discharge Instructions (Signed)
Take your medications as prescribed and follow-up with Dr. Mayford Knife next week.  Return to the ED with exertional chest pain, pain associate shortness of breath, nausea, vomiting, sweating or any other concerns.

## 2022-05-10 NOTE — ED Provider Notes (Signed)
[name] Provider Note   CSN: [id number] Arrival date & time: 05/10/22  1019     History  Chief Complaint  Patient presents with   Tachycardia    [name] is a 77 y.o. female.  Patient with history of hypertension, palpitations, atrial fibrillation on Eliquis.  She presents with palpitations that onset this morning when she woke up about 615.  Felt well going to bed last night.  Describes palpitations as a fluttering sensation in her chest.  Denies chest pain, shortness of breath, nausea, vomiting, lightheadedness or dizziness.  Had episode of A-fib about 4 years ago which she reports was due to hypokalemia and she takes daily potassium supplementation.  States she was attempted to be cardioverted at that time but was not successful. She is on Cardizem and Eliquis since with no missed doses. Wonders whether she could have low potassium or dehydration again as she has been working outside.  Also has been on prednisone recently for a pinched nerve in her back.  Was also treated recently for UTI last week.  Denies dizziness or lightheadedness.  Denies chest pain or shortness of breath.  Denies nausea or vomiting.  The history is provided by the patient.       Home Medications Prior to Admission medications   Medication Sig Start Date End Date Taking? Authorizing Provider  benazepril (LOTENSIN) 40 MG tablet Take 1 tablet (40 mg total) by mouth daily. 11/14/21   Quintella Reichert, MD  calcium carbonate (OSCAL) 1500 (600 Ca) MG TABS tablet Take 600 mg of elemental calcium by mouth daily with breakfast.    [provider]  cholecalciferol (VITAMIN D3) 25 MCG (1000 UNIT) tablet Take 1,000 Units by mouth daily.    [provider]  diltiazem (CARDIZEM CD) 240 MG 24 hr capsule TAKE 1 CAPSULE BY MOUTH EVERY DAY 10/10/21   Turner, Cornelious Bryant, MD  ELIQUIS 5 MG TABS tablet TAKE 1 TABLET BY MOUTH TWICE A DAY 02/24/22   Quintella Reichert, MD  famotidine (PEPCID) 20 MG tablet Take 20 mg by mouth daily as needed (acid reflux).    [provider]  fluticasone (FLONASE) 50 MCG/ACT nasal spray Place 1 spray into both nostrils daily as needed for allergies.    [provider]  furosemide (LASIX) 20 MG tablet Take 1 tablet (20 mg total) by mouth daily. Take 2 tablets (40) mg for 2 days then 1 tablet (20) mg daily. 11/14/21   Quintella Reichert, MD  gabapentin (NEURONTIN) 100 MG capsule Take 1-3 capsules (100-300 mg total) by mouth 3 (three) times daily as needed. 05/06/22   Rodolph Bong, MD  loratadine (CLARITIN) 10 MG tablet Take 1 tablet by mouth daily as needed for allergies.    [provider]  Multiple Vitamin (MULTIVITAMIN WITH MINERALS) TABS tablet Take 1 tablet by mouth daily.    [provider]  ondansetron (ZOFRAN) 4 MG tablet Take 1 tablet (4 mg total) by mouth every 6 (six) hours as needed for nausea. 10/19/21   Kirtland Bouchard, PA-C  potassium chloride SA (KLOR-CON M20) 20 MEQ tablet Take 1 tablet (20 mEq total) by mouth daily. 02/04/22   Quintella Reichert, MD  pravastatin (PRAVACHOL) 20 MG tablet Take 20 mg by mouth at bedtime. 04/03/15   [provider]  predniSONE (DELTASONE) 50 MG tablet Take 1 pill daily for 5 days 05/06/22   Rodolph Bong, MD  promethazine [facility])  12.5 MG tablet Take 1 tablet (12.5 mg total) by mouth every 8 (eight) hours as needed for nausea or vomiting. 10/23/21   Kathryne Hitch, MD  traMADol (ULTRAM) 50 MG tablet Take 1-2 tablets (50-100 mg total) by mouth every 6 (six) hours as needed. 12/09/21   Kathryne Hitch, MD      Allergies    Codeine    Review of Systems   Review of Systems  Constitutional:  Negative for activity change, appetite change and fever.  HENT:  Negative for congestion and rhinorrhea.   Respiratory:  Negative for cough, chest tightness and shortness of breath.   Cardiovascular:  Positive for palpitations.   Gastrointestinal:  Negative for abdominal pain, nausea and vomiting.  Genitourinary:  Negative for dysuria and hematuria.  Musculoskeletal:  Negative for arthralgias and myalgias.  Skin:  Negative for rash.  Neurological:  Negative for dizziness, weakness, light-headedness and headaches.   all other systems are negative except as noted in the HPI and PMH.    Physical Exam Updated Vital Signs BP (!) 154/101 (BP Location: Right Arm)   Pulse (!) 138   Temp 98 F (36.7 C) (Oral)   Resp 18   LMP 01/07/2003   SpO2 100%  Physical Exam Vitals and nursing note reviewed.  Constitutional:      General: She is not in acute distress.    Appearance: She is well-developed.  HENT:     Head: Normocephalic and atraumatic.     Mouth/Throat:     Pharynx: No oropharyngeal exudate.  Eyes:     Conjunctiva/sclera: Conjunctivae normal.     Pupils: Pupils are equal, round, and reactive to light.  Neck:     Comments: No meningismus. Cardiovascular:     Rate and Rhythm: Tachycardia present. Rhythm irregular.     Heart sounds: Normal heart sounds. No murmur heard. Pulmonary:     Effort: Pulmonary effort is normal. No respiratory distress.     Breath sounds: Normal breath sounds.  Abdominal:     Palpations: Abdomen is soft.     Tenderness: There is no abdominal tenderness. There is no guarding or rebound.  Musculoskeletal:        General: No tenderness. Normal range of motion.     Cervical back: Normal range of motion and neck supple.  Skin:    General: Skin is warm.  Neurological:     Mental Status: She is alert and oriented to person, place, and time.     Cranial Nerves: No cranial nerve deficit.     Motor: No abnormal muscle tone.     Coordination: Coordination normal.     Comments:  5/5 strength throughout. CN 2-12 intact.Equal grip strength.   Psychiatric:        Behavior: Behavior normal.     ED Results / Procedures / Treatments   Labs (all labs ordered are listed, but only  abnormal results are displayed) Labs Reviewed  CBC WITH DIFFERENTIAL/PLATELET - Abnormal; Notable for the following components:      Result Value   RBC 5.56 (*)    RDW 15.7 (*)    Abs Immature Granulocytes 0.08 (*)    All other components within normal limits  BASIC METABOLIC PANEL - Abnormal; Notable for the following components:   CO2 21 (*)    Glucose, Bld 254 (*)    All other components within normal limits  BRAIN NATRIURETIC PEPTIDE - Abnormal; Notable for the following components:   B Natriuretic Peptide 224.9 (*)  All other components within normal limits  PROTIME-INR - Abnormal; Notable for the following components:   Prothrombin Time 15.9 (*)    INR 1.3 (*)    All other components within normal limits  MAGNESIUM  TROPONIN I (HIGH SENSITIVITY)  TROPONIN I (HIGH SENSITIVITY)    EKG EKG Interpretation  Date/Time:  Saturday May 10 2022 10:38:47 EDT Ventricular Rate:  135 PR Interval:    QRS Duration: 76 QT Interval:  308 QTC Calculation: 462 R Axis:   6 Text Interpretation: Atrial fibrillation with rapid ventricular response Low voltage QRS Cannot rule out Anterior infarct , age undetermined Abnormal ECG When compared with ECG of 23-Aug-2020 22:02, PREVIOUS ECG IS PRESENT now Afib with RVR Confirmed by Glynn Octave [id number]) on 05/10/2022 10:48:32 AM  Radiology No results found.  Procedures Procedures    Medications Ordered in ED Medications  diltiazem (CARDIZEM) 1 mg/mL load via infusion 15 mg (has no administration in time range)    And  diltiazem (CARDIZEM) 125 mg in dextrose 5% 125 mL (1 mg/mL) infusion (has no administration in time range)    ED Course/ Medical Decision Making/ A&P                             Medical Decision Making Amount and/or Complexity of Data Reviewed Labs: ordered. Decision-making details documented in ED Course. Radiology: ordered and independent interpretation performed. Decision-making details documented in ED  Course. ECG/medicine tests: ordered and independent interpretation performed. Decision-making details documented in ED Course.  Risk Prescription drug management.   Palpitations since 615 this morning since waking up.  No chest pain or shortness of breath.  Stable mental status and blood pressure.  EKG confirms A-fib with RVR.  Risks and benefits of cardioversion discussed with patient.  She denies any missed doses of Eliquis.  She is confident palpitations started this morning when she woke up.  She is concerned cardioversion may not be successful and wishes to try medications at this time.  Patient has spontaneously cardioverted to sinus rhythm.  States she feels improved.  No palpitations or shortness of breath.  Labs reassuring.  Potassium is normal.  Blood sugar 254.  Denies history of diabetes but has been on steroids recently.  Anion gap is normal.  Patient remained in sinus rhythm throughout ED course.  Denies any chest pain, shortness of breath, dizziness, nausea or vomiting.  Blood sugar has improved to 151.  No evidence of DKA.  Discussed with patient watching her blood sugar closely while taking prednisone.  Follow-up with her PCP and cardiologist.  Return to the ED with new or worsening symptoms.        Final Clinical Impression(s) / ED Diagnoses Final diagnoses:  Paroxysmal atrial fibrillation [facility])    Rx / DC Orders ED Discharge Orders     None         Kyllian Clingerman, Jeannett Senior, MD 05/10/22 1422

## 2022-05-10 NOTE — ED Triage Notes (Addendum)
Pt presents to ED POV. Pt c/o htn and tachycardia since this morning ~0615. Pt reports that when she woke up she noted fluttering sensation in chest. Pt reports that she had one episode of a fib (05/2018) d/t hypokalemia. Pt reports taking her home htn meds this morning. Taking elquis

## 2022-05-12 NOTE — Progress Notes (Signed)
Lumbar spine x-ray shows some arthritis changes.

## 2022-05-12 NOTE — Progress Notes (Signed)
Left hip x-ray looks normal to radiology.

## 2022-05-16 ENCOUNTER — Ambulatory Visit (INDEPENDENT_AMBULATORY_CARE_PROVIDER_SITE_OTHER): Payer: Medicare Other | Admitting: Rehabilitative and Restorative Service Providers"

## 2022-05-16 ENCOUNTER — Encounter: Payer: Self-pay | Admitting: Rehabilitative and Restorative Service Providers"

## 2022-05-16 DIAGNOSIS — M5459 Other low back pain: Secondary | ICD-10-CM | POA: Diagnosis not present

## 2022-05-16 DIAGNOSIS — R293 Abnormal posture: Secondary | ICD-10-CM | POA: Diagnosis not present

## 2022-05-16 DIAGNOSIS — M6281 Muscle weakness (generalized): Secondary | ICD-10-CM | POA: Diagnosis not present

## 2022-05-16 DIAGNOSIS — R262 Difficulty in walking, not elsewhere classified: Secondary | ICD-10-CM | POA: Diagnosis not present

## 2022-05-16 NOTE — Therapy (Signed)
OUTPATIENT PHYSICAL THERAPY THORACOLUMBAR EVALUATION   Patient Name: Bridget [name] MRN: [id number] DOB:[birth year], 77 y.o., female Today's Date: 05/16/2022  END OF SESSION:  PT End of Session - 05/16/22 1032     Visit Number 1    Date for PT Re-Evaluation 07/11/22    Authorization Type Medicare    Authorization - Visit Number 1    Progress Note Due on Visit 10    PT Start Time 1015    PT Stop Time 1056    PT Time Calculation (min) 41 min    Activity Tolerance Patient tolerated treatment well;No increased pain    Behavior During Therapy WFL for tasks assessed/performed             Past Medical History:  Diagnosis Date   Arthritis    Cancer (HCC)    basal cell cancer removed on face   Hypertension    Seasonal allergies    Sleep apnea    Past Surgical History:  Procedure Laterality Date   BREAST EXCISIONAL BIOPSY Left    CHOLECYSTECTOMY  02/2010   KNEE SURGERY Right    Meniscal tear   OOPHORECTOMY  01/2003   RSO AT [facility]   TOTAL KNEE ARTHROPLASTY Left 10/18/2021   Procedure: LEFT TOTAL KNEE ARTHROPLASTY;  Surgeon: Kathryne Hitch, MD;  Location: [facility];  Service: Orthopedics;  Laterality: Left;   VAGINAL HYSTERECTOMY  01/2003   [facility]O A&P REPAIR   WISDOM TOOTH EXTRACTION     Patient Active Problem List   Diagnosis Date Noted   Status post total left knee replacement 10/18/2021   Degenerative arthritis of left knee 11/24/2019   Secondary hypercoagulable state (HCC) 01/26/2019   Greater trochanteric bursitis of right hip 09/23/2018   Lumbar radiculopathy 08/12/2018   Gluteal tendonitis of right buttock 07/29/2018   Atrial fibrillation (HCC) 07/21/2018   Arthritis of left acromioclavicular joint 04/26/2018   Partial nontraumatic tear of left rotator cuff 04/26/2018   Hypertension    Seasonal allergies     PCP: Mila Palmer, MD  REFERRING PROVIDER: Rodolph Bong, MD  REFERRING DIAG:  Diagnosis  M54.16 (ICD-10-CM) - Lumbar radiculopathy   G57.02 (ICD-10-CM) - Piriformis syndrome of left side    Rationale for Evaluation and Treatment: Rehabilitation  THERAPY DIAG:  Abnormal posture - Plan: PT plan of care cert/re-cert  Difficulty in walking, not elsewhere classified - Plan: PT plan of care cert/re-cert  Muscle weakness (generalized) - Plan: PT plan of care cert/re-cert  Other low back pain - Plan: PT plan of care cert/re-cert  ONSET DATE: Early April 2024  SUBJECTIVE:  SUBJECTIVE STATEMENT: Ololade notes that in early April she had burning symptoms in the posterior left calf.  She also has low back pain.  Symptoms are most often present with standing (burning symptoms).  Some low back pain is noted with prolonged standing as well.  PERTINENT HISTORY:  OA, skin cancer, HTN, previous Rt knee scope, atrial fibrillation, Lt TKA  PAIN:  Are you having pain? Yes: NPRS scale: 1-4/10 Pain location: Low back and posterior mid to upper left calf Pain description: Burning in leg and low back ache Aggravating factors: Standing Relieving factors: Walking, sitting, sleeping  PRECAUTIONS: Back  WEIGHT BEARING RESTRICTIONS: No  FALLS:  Has patient fallen in last 6 months? No  LIVING ENVIRONMENT: Lives with: lives with their family and lives with their spouse Lives in: House/apartment Stairs:  Can do reciprocally with hand rail Has following equipment at home: Single point cane, Environmental consultant - 2 wheeled, Tour manager, and Grab bars  OCCUPATION: Retired  PLOF: Independent  PATIENT GOALS: Gardening and go to her grandchildren's  events  NEXT MD VISIT: Later in month  OBJECTIVE:   DIAGNOSTIC FINDINGS:  No recent fracture is seen. There is first-degree anterolisthesis at the L4-L5 level. Degenerative changes are noted with bony spurs  and facet hypertrophy. There is marked disc space narrowing at the L5-S1 level.  PATIENT SURVEYS:  FOTO 53 (Goal 65 in 10 visits)  SCREENING FOR RED FLAGS: Bowel or bladder incontinence: No Spinal tumors: No Cauda equina syndrome: No Compression fracture: No Abdominal aneurysm: No  COGNITION: Overall cognitive status: Within functional limits for tasks assessed     SENSATION: Occasional left posterior thigh burning, more frequent left calf burning  MUSCLE LENGTH: Hamstrings: Right 40 deg; Left 40 deg  POSTURE: rounded shoulders, forward head, and decreased lumbar lordosis   AROM eval  Flexion   Extension 0 (less than 5 degrees)  Right lateral flexion   Left lateral flexion   Right rotation   Left rotation    (Blank rows = not tested)  LOWER EXTREMITY ROM:     Passive  Left/Right 05/16/2022   Hip flexion 90/90   Hip extension    Hip abduction    Hip adduction    Hip internal rotation 18/-3   Hip external rotation 22/35   Knee flexion    Knee extension    Ankle dorsiflexion    Ankle plantarflexion    Ankle inversion    Ankle eversion     (Blank rows = not tested)  LOWER EXTREMITY STRENGTH:    MMT Right eval Left eval  Hip flexion    Hip extension    Hip abduction    Hip adduction    Hip internal rotation    Hip external rotation    Knee flexion    Knee extension    Ankle dorsiflexion    Ankle plantarflexion    Ankle inversion    Ankle eversion    Spine strength Objective strength testing was deferred secondary to discomfort with test positioning    (Blank rows = not tested)  GAIT: Distance walked: 100 feet Assistive device utilized: None Level of assistance: Complete Independence Comments: Slight forward flexion at the hips and increasing pain with prolonged periods of standing and walking  TODAY'S TREATMENT:  DATE:  05/16/2022 Standing trunk extension AROM 10 x 3 seconds Shoulder blade pinches 10 x 5 seconds Bridging 10 x 5 seconds   PATIENT EDUCATION:  Education details: Reviewed exam findings and home exercise program Person educated: Patient Education method: Explanation, Demonstration, Tactile cues, Verbal cues, and Handouts Education comprehension: verbalized understanding, returned demonstration, verbal cues required, tactile cues required, and needs further education  HOME EXERCISE PROGRAM: [id number]  ASSESSMENT:  CLINICAL IMPRESSION: Patient is a 76 y.o. female who was seen today for physical therapy evaluation and treatment for lumbar radiculopathy.  Gracye notes symptoms most often occur with standing.  She has limited lumbar extension AROM, limited hip flexibility and low back strength and she will benefit from the recommended POC to meet LTGs.  OBJECTIVE IMPAIRMENTS: Abnormal gait, decreased activity tolerance, decreased endurance, decreased knowledge of condition, difficulty walking, decreased ROM, decreased strength, decreased safety awareness, impaired perceived functional ability, impaired flexibility, improper body mechanics, postural dysfunction, obesity, and pain.   ACTIVITY LIMITATIONS: lifting, bending, standing, bed mobility, and locomotion level  PARTICIPATION LIMITATIONS: interpersonal relationship and community activity  PERSONAL FACTORS: OA, skin cancer, HTN, previous Rt knee scope, atrial fibrillation, Lt TKA are also affecting patient's functional outcome.   REHAB POTENTIAL: Good  CLINICAL DECISION MAKING: Stable/uncomplicated  EVALUATION COMPLEXITY: Low   GOALS: Goals reviewed with patient? Yes  SHORT TERM GOALS: Target date: 06/13/2022   Improve radicular symptoms-free lumbar extension to at least 5 degrees Baseline: Radicular symptoms at < 5 degrees Goal status: INITIAL  2.  Briseyda will be independent with her day 1 HEP Baseline: Started 05/16/2022 Goal  status: INITIAL  3.  Improve bilateral hamstrings flexibility to 45 degrees and hip ER to 40 degrees Baseline: 40 degrees hamstrings and 22/35 degrees hip ER Goal status: INITIAL   LONG TERM GOALS: Target date: 07/11/2022  Improve FOTO to 65 in 10 visits Baseline: 53 Goal status: INITIAL  2.  Cariyah will report low back pain consistently < 3/10 on the Numeric Pain Rating Scale with no complaints of peripheral symptoms Baseline: Low back and left posterior thigh/calf 1-4/10 Goal status: INITIAL  3.  Khadijah will be able to perform ADLs around the house including bed mobility with correct spine/body mechanics Baseline: Education started 05/16/2022 Goal status: INITIAL  4.  [place] will be independent with her long-term HEP at DC Baseline: Started 05/16/2022 Goal status: INITIAL  PLAN:  PT FREQUENCY: 1-2x/week  PT DURATION: 8 weeks  PLANNED INTERVENTIONS: Therapeutic exercises, Therapeutic activity, Neuromuscular re-education, Gait training, Patient/Family education, Self Care, Joint mobilization, Dry Needling, Cryotherapy, Moist heat, Traction, and Manual therapy.  PLAN FOR NEXT SESSION: Review HEP.  Consider hip flexors, hamstrings and stretches for hip ER.  Postural and body mechanics education and practical work.  Hip abductors, scapular retractors and lumbar extensors strength work.   Cherlyn Cushing, PT, MPT 05/16/2022, 2:47 PM

## 2022-05-18 ENCOUNTER — Encounter (HOSPITAL_BASED_OUTPATIENT_CLINIC_OR_DEPARTMENT_OTHER): Payer: Self-pay

## 2022-05-18 ENCOUNTER — Emergency Department (HOSPITAL_BASED_OUTPATIENT_CLINIC_OR_DEPARTMENT_OTHER)
Admission: EM | Admit: 2022-05-18 | Discharge: 2022-05-18 | Disposition: A | Discharge status: Home / Self Care | Payer: Medicare Other | Attending: Emergency Medicine | Admitting: Emergency Medicine

## 2022-05-18 DIAGNOSIS — R42 Dizziness and giddiness: Secondary | ICD-10-CM | POA: Diagnosis not present

## 2022-05-18 DIAGNOSIS — R110 Nausea: Secondary | ICD-10-CM | POA: Insufficient documentation

## 2022-05-18 DIAGNOSIS — Z7901 Long term (current) use of anticoagulants: Secondary | ICD-10-CM | POA: Diagnosis not present

## 2022-05-18 LAB — CBC WITH DIFFERENTIAL/PLATELET
Abs Immature Granulocytes: 0.02 10*3/uL (ref 0.00–0.07)
Basophils Absolute: 0.1 10*3/uL (ref 0.0–0.1)
Basophils Relative: 1 %
Eosinophils Absolute: 0 10*3/uL (ref 0.0–0.5)
Eosinophils Relative: 0 %
HCT: 39.8 % (ref 36.0–46.0)
Hemoglobin: 13 g/dL (ref 12.0–15.0)
Immature Granulocytes: 0 %
Lymphocytes Relative: 15 %
Lymphs Abs: 1.2 10*3/uL (ref 0.7–4.0)
MCH: 26.7 pg (ref 26.0–34.0)
MCHC: 32.7 g/dL (ref 30.0–36.0)
MCV: 81.9 fL (ref 80.0–100.0)
Monocytes Absolute: 0.8 10*3/uL (ref 0.1–1.0)
Monocytes Relative: 9 %
Neutro Abs: 6.3 10*3/uL (ref 1.7–7.7)
Neutrophils Relative %: 75 %
Platelets: 273 10*3/uL (ref 150–400)
RBC: 4.86 MIL/uL (ref 3.87–5.11)
RDW: 15.9 % — ABNORMAL HIGH (ref 11.5–15.5)
WBC: 8.3 10*3/uL (ref 4.0–10.5)
nRBC: 0 % (ref 0.0–0.2)

## 2022-05-18 LAB — COMPREHENSIVE METABOLIC PANEL
ALT: 18 U/L (ref 0–44)
AST: 19 U/L (ref 15–41)
Albumin: 4.1 g/dL (ref 3.5–5.0)
Alkaline Phosphatase: 57 U/L (ref 38–126)
Anion gap: 11 (ref 5–15)
BUN: 15 mg/dL (ref 8–23)
CO2: 22 mmol/L (ref 22–32)
Calcium: 9.1 mg/dL (ref 8.9–10.3)
Chloride: 104 mmol/L (ref 98–111)
Creatinine, Ser: 0.72 mg/dL (ref 0.44–1.00)
GFR, Estimated: >60 mL/min (ref >60)
Glucose, Bld: 126 mg/dL — ABNORMAL HIGH (ref 70–99)
Potassium: 4 mmol/L (ref 3.5–5.1)
Sodium: 137 mmol/L (ref 135–145)
Total Bilirubin: 0.6 mg/dL (ref 0.3–1.2)
Total Protein: 6.9 g/dL (ref 6.5–8.1)

## 2022-05-18 MED ORDER — MECLIZINE HCL 25 MG PO TABS: 25.0000 mg | ORAL_TABLET | Freq: Three times a day (TID) | ORAL | 0 refills | Status: DC | PRN

## 2022-05-18 MED ORDER — DEXAMETHASONE 4 MG PO TABS
10.0000 mg | ORAL_TABLET | Freq: Once | ORAL | Status: AC
  Administered 2022-05-18: 10 mg via ORAL
  Filled 2022-05-18: qty 3

## 2022-05-18 MED ORDER — MECLIZINE HCL 25 MG PO TABS
25.0000 mg | ORAL_TABLET | Freq: Once | ORAL | Status: AC
  Administered 2022-05-18: 25 mg via ORAL
  Filled 2022-05-18: qty 1

## 2022-05-18 MED ORDER — SODIUM CHLORIDE 0.9 % IV BOLUS
1000.0000 mL | Freq: Once | INTRAVENOUS | Status: AC
  Administered 2022-05-18: 1000 mL via INTRAVENOUS

## 2022-05-18 NOTE — ED Triage Notes (Signed)
Onset while in church feeling dizzy, lightheaded and generalize weakness.  Denies SOB.  States has nausea no vomiting

## 2022-05-18 NOTE — Discharge Instructions (Signed)
Eat and drink as well as you can for the next couple days.  Please return for worsening sensation or inability to walk.  Also return for headache neck pain chest pain or difficulty breathing.

## 2022-05-18 NOTE — ED Provider Notes (Signed)
[place] EMERGENCY DEPARTMENT AT [facility] Ed Provider Note   CSN: [id number] Arrival date & time: 05/18/22  1128     History  Chief Complaint  Patient presents with   Dizziness    Bridget [name] is a 77 y.o. female.  77 yo F with a chief complaints of feeling like her head is heavy.  She told me that this happened while she was sitting in church.  She also felt like it was a little bit worse when she took communion.  Had some nausea at that time.  She denies headache denies neck pain denies chest pain denies difficulty breathing.  She has been able to eat and drink today without issue had a normal breakfast for her and some coffee.  She denies any recent medication changes.  Denies cough congestion or fever.  Denies nausea vomiting or diarrhea.   Dizziness      Home Medications Prior to Admission medications   Medication Sig Start Date End Date Taking? Authorizing Provider  meclizine (ANTIVERT) 25 MG tablet Take 1 tablet (25 mg total) by mouth 3 (three) times daily as needed for dizziness. 05/18/22  Yes Melene Plan, DO  benazepril (LOTENSIN) 40 MG tablet Take 1 tablet (40 mg total) by mouth daily. 11/14/21   Quintella Reichert, MD  calcium carbonate (OSCAL) 1500 (600 Ca) MG TABS tablet Take 600 mg of elemental calcium by mouth daily with breakfast.    [provider]  cholecalciferol (VITAMIN D3) 25 MCG (1000 UNIT) tablet Take 1,000 Units by mouth daily.    [provider]  diltiazem (CARDIZEM CD) 240 MG 24 hr capsule TAKE 1 CAPSULE BY MOUTH EVERY DAY 10/10/21   Turner, Cornelious Bryant, MD  ELIQUIS 5 MG TABS tablet TAKE 1 TABLET BY MOUTH TWICE A DAY 02/24/22   Quintella Reichert, MD  famotidine (PEPCID) 20 MG tablet Take 20 mg by mouth daily as needed (acid reflux).    [provider]  fluticasone (FLONASE) 50 MCG/ACT nasal spray Place 1 spray into both nostrils daily as needed for allergies.    [provider]  furosemide (LASIX) 20 MG tablet Take 1  tablet (20 mg total) by mouth daily. Take 2 tablets (40) mg for 2 days then 1 tablet (20) mg daily. 11/14/21   Quintella Reichert, MD  gabapentin (NEURONTIN) 100 MG capsule Take 1-3 capsules (100-300 mg total) by mouth 3 (three) times daily as needed. 05/06/22   Rodolph Bong, MD  loratadine (CLARITIN) 10 MG tablet Take 1 tablet by mouth daily as needed for allergies.    [provider]  Multiple Vitamin (MULTIVITAMIN WITH MINERALS) TABS tablet Take 1 tablet by mouth daily.    [provider]  ondansetron (ZOFRAN) 4 MG tablet Take 1 tablet (4 mg total) by mouth every 6 (six) hours as needed for nausea. 10/19/21   Kirtland Bouchard, PA-C  potassium chloride SA (KLOR-CON M20) 20 MEQ tablet Take 1 tablet (20 mEq total) by mouth daily. 02/04/22   Quintella Reichert, MD  pravastatin (PRAVACHOL) 20 MG tablet Take 20 mg by mouth at bedtime. 04/03/15   [provider]  predniSONE (DELTASONE) 50 MG tablet Take 1 pill daily for 5 days 05/06/22   Rodolph Bong, MD  promethazine (PHENERGAN) 12.5 MG tablet Take 1 tablet (12.5 mg total) by mouth every 8 (eight) hours as needed for nausea or vomiting. 10/23/21   Kathryne Hitch, MD  traMADol (ULTRAM) 50 MG tablet Take 1-2  tablets (50-100 mg total) by mouth every 6 (six) hours as needed. 12/09/21   Kathryne Hitch, MD      Allergies    Codeine    Review of Systems   Review of Systems  Neurological:  Positive for dizziness.    Physical Exam Updated Vital Signs BP (!) 111/93 (BP Location: Left Arm)   Pulse 67   Temp 98 F (36.7 C)   Resp 18   Ht 5\' 4"  (1.626 m)   Wt 105.6 kg   LMP 01/07/2003   SpO2 98%   BMI 39.96 kg/m  Physical Exam Vitals and nursing note reviewed.  Constitutional:      General: She is not in acute distress.    Appearance: She is well-developed. She is not diaphoretic.  HENT:     Head: Normocephalic and atraumatic.  Eyes:     Pupils: Pupils are equal, round, and reactive to light.   Cardiovascular:     Rate and Rhythm: Normal rate and regular rhythm.     Heart sounds: No murmur heard.    No friction rub. No gallop.  Pulmonary:     Effort: Pulmonary effort is normal.     Breath sounds: No wheezing or rales.  Abdominal:     General: There is no distension.     Palpations: Abdomen is soft.     Tenderness: There is no abdominal tenderness.  Musculoskeletal:        General: No tenderness.     Cervical back: Normal range of motion and neck supple.  Skin:    General: Skin is warm and dry.  Neurological:     Mental Status: She is alert and oriented to person, place, and time.     Cranial Nerves: Cranial nerves 2-12 are intact.     Sensory: Sensation is intact.     Motor: Motor function is intact.     Coordination: Coordination is intact.     Comments: Benign neurologic exam  Psychiatric:        Behavior: Behavior normal.     ED Results / Procedures / Treatments   Labs (all labs ordered are listed, but only abnormal results are displayed) Labs Reviewed  CBC WITH DIFFERENTIAL/PLATELET - Abnormal; Notable for the following components:      Result Value   RDW 15.9 (*)    All other components within normal limits  COMPREHENSIVE METABOLIC PANEL - Abnormal; Notable for the following components:   Glucose, Bld 126 (*)    All other components within normal limits    EKG EKG Interpretation  Date/Time:  Sunday May 18 2022 11:43:16 EDT Ventricular Rate:  68 PR Interval:  159 QRS Duration: 101 QT Interval:  430 QTC Calculation: 458 R Axis:   9 Text Interpretation: Sinus rhythm Low voltage, precordial leads No significant change since last tracing Confirmed by Melene Plan [id number]) on 05/18/2022 12:06:06 PM  Radiology No results found.  Procedures Procedures    Medications Ordered in ED Medications  sodium chloride 0.9 % bolus 1,000 mL (1,000 mLs Intravenous New Bag/Given 05/18/22 1156)  meclizine (ANTIVERT) tablet 25 mg (25 mg Oral Given 05/18/22 1204)   dexamethasone (DECADRON) tablet 10 mg (10 mg Oral Given 05/18/22 1203)    ED Course/ Medical Decision Making/ A&P                             Medical Decision Making Amount and/or Complexity of Data Reviewed Labs: ordered.  Risk Prescription drug management.   77 yo F with a chief complaints of feeling like her head is heavy.  This started while she was at church.  Has ongoing symptoms here though nonspecific for me.  Not specifically vertiginous, not necessarily presyncopal.  Will give a bolus of IV fluids.  Will check blood work.  Symptomatic therapy.  Reassess.  No anemia no significant electrolyte abnormality.  Patient feeling a bit better on repeat assessment.  Was able to ambulate to the bathroom and back without issue.  The patient tells me that she has had prescriptions for meclizine in the past.  Has follow-up with her PCP tomorrow.   1:08 PM:  I have discussed the diagnosis/risks/treatment options with the patient.  Evaluation and diagnostic testing in the emergency department does not suggest an emergent condition requiring admission or immediate intervention beyond what has been performed at this time.  They will follow up with PCP. We also discussed returning to the ED immediately if new or worsening sx occur. We discussed the sx which are most concerning (e.g., sudden worsening pain, fever, inability to tolerate by mouth) that necessitate immediate return. Medications administered to the patient during their visit and any new prescriptions provided to the patient are listed below.  Medications given during this visit Medications  sodium chloride 0.9 % bolus 1,000 mL (1,000 mLs Intravenous New Bag/Given 05/18/22 1156)  meclizine (ANTIVERT) tablet 25 mg (25 mg Oral Given 05/18/22 1204)  dexamethasone (DECADRON) tablet 10 mg (10 mg Oral Given 05/18/22 1203)     The patient appears reasonably screen and/or stabilized for discharge and I doubt any other medical condition or  other [facility] requiring further screening, evaluation, or treatment in the ED at this time prior to discharge.         Final Clinical Impression(s) / ED Diagnoses Final diagnoses:  Lightheaded    Rx / DC Orders ED Discharge Orders          Ordered    meclizine (ANTIVERT) 25 MG tablet  3 times daily PRN        05/18/22 1257              Melene Plan, DO 05/18/22 1308

## 2022-05-18 NOTE — Progress Notes (Signed)
RT ambulated the Pt on RA. SATS were 95-97%.

## 2022-05-19 DIAGNOSIS — I519 Heart disease, unspecified: Secondary | ICD-10-CM | POA: Diagnosis not present

## 2022-05-19 DIAGNOSIS — I4891 Unspecified atrial fibrillation: Secondary | ICD-10-CM | POA: Diagnosis not present

## 2022-05-19 DIAGNOSIS — D6869 Other thrombophilia: Secondary | ICD-10-CM | POA: Diagnosis not present

## 2022-05-19 DIAGNOSIS — E099 Drug or chemical induced diabetes mellitus without complications: Secondary | ICD-10-CM | POA: Diagnosis not present

## 2022-05-19 DIAGNOSIS — R300 Dysuria: Secondary | ICD-10-CM | POA: Diagnosis not present

## 2022-05-19 DIAGNOSIS — N390 Urinary tract infection, site not specified: Secondary | ICD-10-CM | POA: Diagnosis not present

## 2022-05-20 ENCOUNTER — Ambulatory Visit (INDEPENDENT_AMBULATORY_CARE_PROVIDER_SITE_OTHER): Payer: Medicare Other | Admitting: Physical Therapy

## 2022-05-20 ENCOUNTER — Encounter: Payer: Self-pay | Admitting: Physical Therapy

## 2022-05-20 DIAGNOSIS — M5459 Other low back pain: Secondary | ICD-10-CM

## 2022-05-20 DIAGNOSIS — R262 Difficulty in walking, not elsewhere classified: Secondary | ICD-10-CM | POA: Diagnosis not present

## 2022-05-20 DIAGNOSIS — M6281 Muscle weakness (generalized): Secondary | ICD-10-CM | POA: Diagnosis not present

## 2022-05-20 DIAGNOSIS — R293 Abnormal posture: Secondary | ICD-10-CM

## 2022-05-20 NOTE — Therapy (Signed)
OUTPATIENT PHYSICAL THERAPY TREATMENT  Patient Name: Bridget [name] MRN: [id number] DOB:01-08-1945, 77 y.o., female Today's Date: 05/20/2022  END OF SESSION:  PT End of Session - 05/20/22 1148     Visit Number 2    Date for PT Re-Evaluation 07/11/22    Authorization Type Medicare    Progress Note Due on Visit 10    PT Start Time 1145    PT Stop Time 1225    PT Time Calculation (min) 40 min    Activity Tolerance Patient tolerated treatment well;No increased pain    Behavior During Therapy WFL for tasks assessed/performed             Past Medical History:  Diagnosis Date   Arthritis    Cancer (HCC)    basal cell cancer removed on face   Hypertension    Seasonal allergies    Sleep apnea    Past Surgical History:  Procedure Laterality Date   BREAST EXCISIONAL BIOPSY Left    CHOLECYSTECTOMY  02/2010   KNEE SURGERY Right    Meniscal tear   OOPHORECTOMY  01/2003   RSO AT [facility]   TOTAL KNEE ARTHROPLASTY Left 10/18/2021   Procedure: LEFT TOTAL KNEE ARTHROPLASTY;  Surgeon: Bridget Hitch, MD;  Location: [facility] ORS;  Service: Orthopedics;  Laterality: Left;   VAGINAL HYSTERECTOMY  01/2003   [facility],RSO A&P REPAIR   WISDOM TOOTH EXTRACTION     Patient Active Problem List   Diagnosis Date Noted   Status post total left knee replacement 10/18/2021   Degenerative arthritis of left knee 11/24/2019   Secondary hypercoagulable state (HCC) 01/26/2019   Greater trochanteric bursitis of right hip 09/23/2018   Lumbar radiculopathy 08/12/2018   Gluteal tendonitis of right buttock 07/29/2018   Atrial fibrillation (HCC) 07/21/2018   Arthritis of left acromioclavicular joint 04/26/2018   Partial nontraumatic tear of left rotator cuff 04/26/2018   Hypertension    Seasonal allergies     PCP: Bridget Palmer, MD  REFERRING PROVIDER: Rodolph Bong, MD  REFERRING DIAG:  Diagnosis  M54.16 (ICD-10-CM) - Lumbar radiculopathy  G57.02 (ICD-10-CM) - Piriformis syndrome of left side     Rationale for Evaluation and Treatment: Rehabilitation  THERAPY DIAG:  Abnormal posture  Difficulty in walking, not elsewhere classified  Muscle weakness (generalized)  Other low back pain  ONSET DATE: Early April 2024  SUBJECTIVE:                                                                                                                                                                                           SUBJECTIVE STATEMENT: Bridget [name] notes the back pain  is not too bad today but is enough to be annoying.   PERTINENT HISTORY:  OA, skin cancer, HTN, previous Rt knee scope, atrial fibrillation, Lt TKA  PAIN:  Are you having pain? Yes: NPRS scale: 3/10 Pain location: Low back and posterior mid to upper left calf Pain description: Burning in leg and low back ache Aggravating factors: Standing Relieving factors: Walking, sitting, sleeping  PRECAUTIONS: Back  WEIGHT BEARING RESTRICTIONS: No  FALLS:  Has patient fallen in last 6 months? No  LIVING ENVIRONMENT: Lives with: lives with their family and lives with their spouse Lives in: House/apartment Stairs:  Can do reciprocally with hand rail Has following equipment at home: Single point cane, Environmental consultant - 2 wheeled, Tour manager, and Grab bars  OCCUPATION: Retired  PLOF: Independent  PATIENT GOALS: Gardening and go to her grandchildren's  events  NEXT MD VISIT: Later in month  OBJECTIVE:   DIAGNOSTIC FINDINGS:  No recent fracture is seen. There is first-degree anterolisthesis at the L4-L5 level. Degenerative changes are noted with bony spurs and facet hypertrophy. There is marked disc space narrowing at the L5-S1 level.  PATIENT SURVEYS:  FOTO 53 (Goal 65 in 10 visits)  SCREENING FOR RED FLAGS: Bowel or bladder incontinence: No Spinal tumors: No Cauda equina syndrome: No Compression fracture: No Abdominal aneurysm: No  COGNITION: Overall cognitive status: Within functional limits for tasks  assessed     SENSATION: Occasional left posterior thigh burning, more frequent left calf burning  MUSCLE LENGTH: Hamstrings: Right 40 deg; Left 40 deg  POSTURE: rounded shoulders, forward head, and decreased lumbar lordosis   AROM eval  Flexion   Extension 0 (less than 5 degrees)  Right lateral flexion   Left lateral flexion   Right rotation   Left rotation    (Blank rows = not tested)  LOWER EXTREMITY ROM:     Passive  Left/Right 05/16/2022   Hip flexion 90/90   Hip extension    Hip abduction    Hip adduction    Hip internal rotation 18/-3   Hip external rotation 22/35   Knee flexion    Knee extension    Ankle dorsiflexion    Ankle plantarflexion    Ankle inversion    Ankle eversion     (Blank rows = not tested)  LOWER EXTREMITY STRENGTH:    MMT Right eval Left eval  Hip flexion    Hip extension    Hip abduction    Hip adduction    Hip internal rotation    Hip external rotation    Knee flexion    Knee extension    Ankle dorsiflexion    Ankle plantarflexion    Ankle inversion    Ankle eversion    Spine strength Objective strength testing was deferred secondary to discomfort with test positioning    (Blank rows = not tested)  GAIT: Distance walked: 100 feet Assistive device utilized: None Level of assistance: Complete Independence Comments: Slight forward flexion at the hips and increasing pain with prolonged periods of standing and walking  TODAY'S TREATMENT:  DATE: 05/20/2022 Nu step L5 UE/LE seat #8 X 6 min Slantboard gastroc stretch 20 sec X 3 bilat Standing hip extension X 15 bilat Standing hip abduction X 10 bilat Shoulder Rows red X 15 Shoulder Extensions red X 15 Supine hamstring stretch 10 sec X 5 with strap Supine LTR 10 sec X 5 bilat Bridging 10 x 5 seconds Standing trunk extension AROM 10 x 3 seconds  DATE:  05/16/2022 Standing trunk extension AROM 10 x 3 seconds Shoulder blade pinches 10 x 5 seconds Bridging 10 x 5 seconds   PATIENT EDUCATION:  Education details: Reviewed exam findings and home exercise program Person educated: Patient Education method: Explanation, Demonstration, Tactile cues, Verbal cues, and Handouts Education comprehension: verbalized understanding, returned demonstration, verbal cues required, tactile cues required, and needs further education  HOME EXERCISE PROGRAM: [email or web address]  ASSESSMENT:  CLINICAL IMPRESSION: She showed good understanding of her HEP and I did add in hamstring stretch and figure 4 hip ER stretch today and printed out pictures for her. PT will attempt to progress her ROM and strength to improve function as tolerated.   OBJECTIVE IMPAIRMENTS: Abnormal gait, decreased activity tolerance, decreased endurance, decreased knowledge of condition, difficulty walking, decreased ROM, decreased strength, decreased safety awareness, impaired perceived functional ability, impaired flexibility, improper body mechanics, postural dysfunction, obesity, and pain.   ACTIVITY LIMITATIONS: lifting, bending, standing, bed mobility, and locomotion level  PARTICIPATION LIMITATIONS: interpersonal relationship and community activity  PERSONAL FACTORS: OA, skin cancer, HTN, previous Rt knee scope, atrial fibrillation, Lt TKA are also affecting patient's functional outcome.   REHAB POTENTIAL: Good  CLINICAL DECISION MAKING: Stable/uncomplicated  EVALUATION COMPLEXITY: Low   GOALS: Goals reviewed with patient? Yes  SHORT TERM GOALS: Target date: 06/13/2022   Improve radicular symptoms-free lumbar extension to at least 5 degrees Baseline: Radicular symptoms at < 5 degrees Goal status: INITIAL  2.  Mehlani will be independent with her day 1 HEP Baseline: Started 05/16/2022 Goal status: INITIAL  3.  Improve bilateral hamstrings flexibility to 45 degrees and hip ER to 40  degrees Baseline: 40 degrees hamstrings and 22/35 degrees hip ER Goal status: INITIAL   LONG TERM GOALS: Target date: 07/11/2022  Improve FOTO to 65 in 10 visits Baseline: 53 Goal status: INITIAL  2.  Uniquia will report low back pain consistently < 3/10 on the Numeric Pain Rating Scale with no complaints of peripheral symptoms Baseline: Low back and left posterior thigh/calf 1-4/10 Goal status: INITIAL  3.  Monicka will be able to perform ADLs around the house including bed mobility with correct spine/body mechanics Baseline: Education started 05/16/2022 Goal status: INITIAL  4.  Mckenize will be independent with her long-term HEP at DC Baseline: Started 05/16/2022 Goal status: INITIAL  PLAN:  PT FREQUENCY: 1-2x/week  PT DURATION: 8 weeks  PLANNED INTERVENTIONS: Therapeutic exercises, Therapeutic activity, Neuromuscular re-education, Gait training, Patient/Family education, Self Care, Joint mobilization, Dry Needling, Cryotherapy, Moist heat, Traction, and Manual therapy.  PLAN FOR NEXT SESSION: ROM for hip, Postural and body mechanics education and practical work.  Hip abductors, scapular retractors and lumbar extensors strength work.   April Manson, PT, DPT 05/20/2022, 12:25 PM

## 2022-05-27 ENCOUNTER — Ambulatory Visit (INDEPENDENT_AMBULATORY_CARE_PROVIDER_SITE_OTHER): Payer: Medicare Other | Admitting: Physical Therapy

## 2022-05-27 ENCOUNTER — Ambulatory Visit (HOSPITAL_COMMUNITY)
Admission: RE | Admit: 2022-05-27 | Discharge: 2022-05-27 | Disposition: A | Discharge status: Home / Self Care | Payer: Medicare Other | Source: Ambulatory Visit | Attending: Physician Assistant | Admitting: Physician Assistant

## 2022-05-27 VITALS — BP 146/80 | HR 69 | Ht 64.0 in | Wt 223.8 lb

## 2022-05-27 DIAGNOSIS — M6281 Muscle weakness (generalized): Secondary | ICD-10-CM | POA: Diagnosis not present

## 2022-05-27 DIAGNOSIS — M5459 Other low back pain: Secondary | ICD-10-CM

## 2022-05-27 DIAGNOSIS — R293 Abnormal posture: Secondary | ICD-10-CM | POA: Diagnosis not present

## 2022-05-27 DIAGNOSIS — I10 Essential (primary) hypertension: Secondary | ICD-10-CM | POA: Insufficient documentation

## 2022-05-27 DIAGNOSIS — G4733 Obstructive sleep apnea (adult) (pediatric): Secondary | ICD-10-CM | POA: Insufficient documentation

## 2022-05-27 DIAGNOSIS — D6869 Other thrombophilia: Secondary | ICD-10-CM | POA: Diagnosis not present

## 2022-05-27 DIAGNOSIS — R262 Difficulty in walking, not elsewhere classified: Secondary | ICD-10-CM | POA: Diagnosis not present

## 2022-05-27 DIAGNOSIS — Z7901 Long term (current) use of anticoagulants: Secondary | ICD-10-CM | POA: Insufficient documentation

## 2022-05-27 DIAGNOSIS — Z79899 Other long term (current) drug therapy: Secondary | ICD-10-CM | POA: Insufficient documentation

## 2022-05-27 DIAGNOSIS — I480 Paroxysmal atrial fibrillation: Secondary | ICD-10-CM | POA: Diagnosis not present

## 2022-05-27 MED ORDER — DILTIAZEM HCL 30 MG PO TABS: ORAL_TABLET | ORAL | 1 refills | Status: DC

## 2022-05-27 NOTE — Therapy (Signed)
OUTPATIENT PHYSICAL THERAPY TREATMENT  Patient Name: Bridget [name] MRN: [id number] DOB:Jun 22, 1945, 77 y.o., female Today's Date: 05/27/2022  END OF SESSION:  PT End of Session - 05/27/22 1501     Visit Number 3    Date for PT Re-Evaluation 07/11/22    Authorization Type Medicare    Progress Note Due on Visit 10    PT Start Time 1425    PT Stop Time 1506    PT Time Calculation (min) 41 min    Activity Tolerance Patient tolerated treatment well;No increased pain    Behavior During Therapy WFL for tasks assessed/performed              Past Medical History:  Diagnosis Date   Arthritis    Cancer (HCC)    basal cell cancer removed on face   Hypertension    Seasonal allergies    Sleep apnea    Past Surgical History:  Procedure Laterality Date   BREAST EXCISIONAL BIOPSY Left    CHOLECYSTECTOMY  02/2010   KNEE SURGERY Right    Meniscal tear   OOPHORECTOMY  01/2003   RSO AT [facility]   TOTAL KNEE ARTHROPLASTY Left 10/18/2021   Procedure: LEFT TOTAL KNEE ARTHROPLASTY;  Surgeon: Kathryne Hitch, MD;  Location: [facility];  Service: Orthopedics;  Laterality: Left;   VAGINAL HYSTERECTOMY  01/2003   [facility]O A&P REPAIR   WISDOM TOOTH EXTRACTION     Patient Active Problem List   Diagnosis Date Noted   Status post total left knee replacement 10/18/2021   Degenerative arthritis of left knee 11/24/2019   Hypercoagulable state due to paroxysmal atrial fibrillation (HCC) 01/26/2019   Greater trochanteric bursitis of right hip 09/23/2018   Lumbar radiculopathy 08/12/2018   Gluteal tendonitis of right buttock 07/29/2018   Atrial fibrillation (HCC) 07/21/2018   Arthritis of left acromioclavicular joint 04/26/2018   Partial nontraumatic tear of left rotator cuff 04/26/2018   Hypertension    Seasonal allergies     PCP: Mila Palmer, MD  REFERRING PROVIDER: Rodolph Bong, MD  REFERRING DIAG:  Diagnosis  M54.16 (ICD-10-CM) - Lumbar radiculopathy  G57.02 (ICD-10-CM) -  Piriformis syndrome of left side    Rationale for Evaluation and Treatment: Rehabilitation  THERAPY DIAG:  Abnormal posture  Difficulty in walking, not elsewhere classified  Muscle weakness (generalized)  Other low back pain  ONSET DATE: Early April 2024  SUBJECTIVE:                                                                                                                                                                                           SUBJECTIVE STATEMENT:  Bridget [name] notes the back pain is doing okay today, she has some tenderness in her calf as well today  PERTINENT HISTORY:  OA, skin cancer, HTN, previous Rt knee scope, atrial fibrillation, Lt TKA  PAIN:  Are you having pain? Yes: NPRS scale: 2/10 Pain location: Low back and posterior mid to upper left calf Pain description: Burning in leg and low back ache Aggravating factors: Standing Relieving factors: Walking, sitting, sleeping  PRECAUTIONS: Back  WEIGHT BEARING RESTRICTIONS: No  FALLS:  Has patient fallen in last 6 months? No  LIVING ENVIRONMENT: Lives with: lives with their family and lives with their spouse Lives in: House/apartment Stairs:  Can do reciprocally with hand rail Has following equipment at home: Single point cane, Environmental consultant - 2 wheeled, Tour manager, and Grab bars  OCCUPATION: Retired  PLOF: Independent  PATIENT GOALS: Gardening and go to her grandchildren's  events  NEXT MD VISIT: Later in month  OBJECTIVE:   DIAGNOSTIC FINDINGS:  No recent fracture is seen. There is first-degree anterolisthesis at the L4-L5 level. Degenerative changes are noted with bony spurs and facet hypertrophy. There is marked disc space narrowing at the L5-S1 level.  PATIENT SURVEYS:  FOTO 53 (Goal 65 in 10 visits)  SCREENING FOR RED FLAGS: Bowel or bladder incontinence: No Spinal tumors: No Cauda equina syndrome: No Compression fracture: No Abdominal aneurysm: No  COGNITION: Overall  cognitive status: Within functional limits for tasks assessed     SENSATION: Occasional left posterior thigh burning, more frequent left calf burning  MUSCLE LENGTH: Hamstrings: Right 40 deg; Left 40 deg  POSTURE: rounded shoulders, forward head, and decreased lumbar lordosis   AROM eval  Flexion   Extension 0 (less than 5 degrees)  Right lateral flexion   Left lateral flexion   Right rotation   Left rotation    (Blank rows = not tested)  LOWER EXTREMITY ROM:     Passive  Left/Right 05/16/2022   Hip flexion 90/90   Hip extension    Hip abduction    Hip adduction    Hip internal rotation 18/-3   Hip external rotation 22/35   Knee flexion    Knee extension    Ankle dorsiflexion    Ankle plantarflexion    Ankle inversion    Ankle eversion     (Blank rows = not tested)  LOWER EXTREMITY STRENGTH:    MMT Right eval Left eval  Hip flexion    Hip extension    Hip abduction    Hip adduction    Hip internal rotation    Hip external rotation    Knee flexion    Knee extension    Ankle dorsiflexion    Ankle plantarflexion    Ankle inversion    Ankle eversion    Spine strength Objective strength testing was deferred secondary to discomfort with test positioning    (Blank rows = not tested)  GAIT: Distance walked: 100 feet Assistive device utilized: None Level of assistance: Complete Independence Comments: Slight forward flexion at the hips and increasing pain with prolonged periods of standing and walking  TODAY'S TREATMENT:  DATE: 05/27/2022 Nu step L5 UE/LE seat #8 X 6 min Slantboard gastroc stretch 30 sec X 3 bilat Standing heel and toe raises X 15 Standing hip extension X 15 bilat with red band Standing hip abduction X 15 bilat with red band Seated lumbar flexion stretch pball roll outs 5 sec X 10 Shoulder Rows red X 15 Shoulder  Extensions red X 15 Supine hamstring stretch 10 sec X 5 with strap Supine LTR 10 sec X 5 bilat Bridging 10 x 5 seconds Standing trunk extension AROM 10 x 3 seconds  DATE: 05/20/2022 Nu step L5 UE/LE seat #8 X 6 min Slantboard gastroc stretch 20 sec X 3 bilat Standing hip extension X 15 bilat Standing hip abduction X 10 bilat Shoulder Rows red X 15 Shoulder Extensions red X 15 Supine hamstring stretch 10 sec X 5 with strap Supine LTR 10 sec X 5 bilat Bridging 10 x 5 seconds Standing trunk extension AROM 10 x 3 seconds  DATE: 05/16/2022 Standing trunk extension AROM 10 x 3 seconds Shoulder blade pinches 10 x 5 seconds Bridging 10 x 5 seconds   PATIENT EDUCATION:  Education details: Reviewed exam findings and home exercise program Person educated: Patient Education method: Explanation, Demonstration, Tactile cues, Verbal cues, and Handouts Education comprehension: verbalized understanding, returned demonstration, verbal cues required, tactile cues required, and needs further education  HOME EXERCISE PROGRAM: [id number]  ASSESSMENT:  CLINICAL IMPRESSION: She tolerated session well working on strength and stretching for legs and back today. She will continue to benefit from skilled PT.   OBJECTIVE IMPAIRMENTS: Abnormal gait, decreased activity tolerance, decreased endurance, decreased knowledge of condition, difficulty walking, decreased ROM, decreased strength, decreased safety awareness, impaired perceived functional ability, impaired flexibility, improper body mechanics, postural dysfunction, obesity, and pain.   ACTIVITY LIMITATIONS: lifting, bending, standing, bed mobility, and locomotion level  PARTICIPATION LIMITATIONS: interpersonal relationship and community activity  PERSONAL FACTORS: OA, skin cancer, HTN, previous Rt knee scope, atrial fibrillation, Lt TKA are also affecting patient's functional outcome.   REHAB POTENTIAL: Good  CLINICAL DECISION MAKING:  Stable/uncomplicated  EVALUATION COMPLEXITY: Low   GOALS: Goals reviewed with patient? Yes  SHORT TERM GOALS: Target date: 06/13/2022   Improve radicular symptoms-free lumbar extension to at least 5 degrees Baseline: Radicular symptoms at < 5 degrees Goal status: INITIAL  2.  Ase will be independent with her day 1 HEP Baseline: Started 05/16/2022 Goal status: INITIAL  3.  Improve bilateral hamstrings flexibility to 45 degrees and hip ER to 40 degrees Baseline: 40 degrees hamstrings and 22/35 degrees hip ER Goal status: INITIAL   LONG TERM GOALS: Target date: 07/11/2022  Improve FOTO to 65 in 10 visits Baseline: 53 Goal status: INITIAL  2.  Mkayla will report low back pain consistently < 3/10 on the Numeric Pain Rating Scale with no complaints of peripheral symptoms Baseline: Low back and left posterior thigh/calf 1-4/10 Goal status: INITIAL  3.  Tayiah will be able to perform ADLs around the house including bed mobility with correct spine/body mechanics Baseline: Education started 05/16/2022 Goal status: INITIAL  4.  Breslin will be independent with her long-term HEP at [place] Baseline: Started 05/16/2022 Goal status: INITIAL  PLAN:  PT FREQUENCY: 1-2x/week  PT DURATION: 8 weeks  PLANNED INTERVENTIONS: Therapeutic exercises, Therapeutic activity, Neuromuscular re-education, Gait training, Patient/Family education, Self Care, Joint mobilization, Dry Needling, Cryotherapy, Moist heat, Traction, and Manual therapy.  PLAN FOR NEXT SESSION: ROM for hip, Postural and body mechanics education and practical work.  Hip abductors, scapular  retractors and lumbar extensors strength work.   April Manson, PT, DPT 05/27/2022, 3:05 PM

## 2022-05-27 NOTE — Progress Notes (Signed)
Primary Care Physician: Mila Palmer, MD Referring Physician: [facility] Primary Cardiologist: Dr Arnetha Gula is a 77 y.o. female with a history of HTN, OSA, atrial fibrillation who presents for follow up in the [facility]. Patient was in her usual state of health until the early morning hours of 05/11/18 when she woke with heart racing, palpitations, and diaphoresis. No associated chest discomfort or SOB. She presented to the ER and was found to be in afib with RVR. Cardioversion was attempted x4 but was unsuccessful. Of note, her K+ was 2.9 and patient admits she had diarrhea for four days prior. She denies ever having symptoms like this before. She was started on Eliquis for a CHADS2VASC score of 4 and metoprolol and discharged in rate controlled afib. She spontaneously converted to SR prior to follow up. She denies significant alcohol use.  Patient was seen at the ED 05/10/22 with sudden onset tachypalpitations after waking that morning. ECG showed afib with RVR. She spontaneously converted to SR. Of note, she was being treated with prednisone for hip pain at the time. That was her first known episode of afib since 2022.   On follow up today, patient reports that she feels well. She is in SR. No bleeding issues on anticoagulation.   Today, she denies symptoms of palpitations, chest pain, shortness of breath, orthopnea, PND, dizziness, presyncope, syncope, bleeding, or neurologic sequela. The patient is tolerating medications without difficulties and is otherwise without complaint today.    Atrial Fibrillation Risk Factors:  she does have a diagnosis of sleep apnea. She is compliant with CPAP therapy.  she does not have a history of rheumatic fever. she does not have a history of alcohol use. The patient does not have a history of early familial atrial fibrillation or other arrhythmias.  she has a BMI of Body mass index is 38.42  kg/m.Marland Kitchen Filed Weights   05/27/22 1046  Weight: 101.5 kg   Family History  Problem Relation Age of Onset   Heart disease Mother    Diabetes Father        AODM   Hypertension Father    Cancer Father        LUNG   Cancer Brother 32       BLADDER   Breast cancer Sister 40     Atrial Fibrillation Management history:  Previous antiarrhythmic drugs: none Previous cardioversions: 2020 Previous ablations: none Anticoagulation history: Eliquis   Past Medical History:  Diagnosis Date   Arthritis    Cancer (HCC)    basal cell cancer removed on face   Hypertension    Seasonal allergies    Sleep apnea    Past Surgical History:  Procedure Laterality Date   BREAST EXCISIONAL BIOPSY Left    CHOLECYSTECTOMY  02/2010   KNEE SURGERY Right    Meniscal tear   OOPHORECTOMY  01/2003   RSO AT [facility]   TOTAL KNEE ARTHROPLASTY Left 10/18/2021   Procedure: LEFT TOTAL KNEE ARTHROPLASTY;  Surgeon: Kathryne Hitch, MD;  Location: [facility];  Service: Orthopedics;  Laterality: Left;   VAGINAL HYSTERECTOMY  01/2003   TVH,RSO A&P REPAIR   WISDOM TOOTH EXTRACTION      Current Outpatient Medications  Medication Sig Dispense Refill   alendronate (FOSAMAX) 70 MG tablet Take 70 mg by mouth once a week.     benazepril (LOTENSIN) 40 MG tablet Take 1 tablet (40 mg total) by mouth daily. 90 tablet 3  calcium carbonate (OSCAL) 1500 (600 Ca) MG TABS tablet Take 600 mg of elemental calcium by mouth daily with breakfast.     cholecalciferol (VITAMIN D3) 25 MCG (1000 UNIT) tablet Take 1,000 Units by mouth daily.     diltiazem (CARDIZEM CD) 240 MG 24 hr capsule TAKE 1 CAPSULE BY MOUTH EVERY DAY 90 capsule 3   ELIQUIS 5 MG TABS tablet TAKE 1 TABLET BY MOUTH TWICE A DAY 180 tablet 1   famotidine (PEPCID) 20 MG tablet Take 20 mg by mouth daily as needed (acid reflux).     fluticasone (FLONASE) 50 MCG/ACT nasal spray Place 1 spray into both nostrils daily as needed for allergies.     furosemide (LASIX) 20  MG tablet Take 1 tablet (20 mg total) by mouth daily. Take 2 tablets (40) mg for 2 days then 1 tablet (20) mg daily. 90 tablet 3   loratadine (CLARITIN) 10 MG tablet Take 1 tablet by mouth daily as needed for allergies.     meclizine (ANTIVERT) 25 MG tablet Take 1 tablet (25 mg total) by mouth 3 (three) times daily as needed for dizziness. 30 tablet 0   Multiple Vitamin (MULTIVITAMIN WITH MINERALS) TABS tablet Take 1 tablet by mouth daily.     nitrofurantoin (MACRODANTIN) 100 MG capsule Take 100 mg by mouth 2 (two) times daily.     potassium chloride SA (KLOR-CON M20) 20 MEQ tablet Take 1 tablet (20 mEq total) by mouth daily. 90 tablet 3   pravastatin (PRAVACHOL) 20 MG tablet Take 20 mg by mouth at bedtime.     gabapentin (NEURONTIN) 100 MG capsule Take 1-3 capsules (100-300 mg total) by mouth 3 (three) times daily as needed. (Patient not taking: Reported on 05/27/2022) 30 capsule 3   No current facility-administered medications for this encounter.    Allergies  Allergen Reactions   Codeine Nausea Only    Social History   Socioeconomic History   Marital status: Married    Spouse name: Not on file   Number of children: Not on file   Years of education: Not on file   Highest education level: Not on file  Occupational History   Not on file  Tobacco Use   Smoking status: Never   Smokeless tobacco: Never  Vaping Use   Vaping Use: Never used  Substance and Sexual Activity   Alcohol use: Yes    Comment: SELDOM   Drug use: No   Sexual activity: Not Currently    Comment: HYST  Other Topics Concern   Not on file  Social History Narrative   Not on file   Social Determinants of Health   Financial Resource Strain: Not on file  Food Insecurity: No Food Insecurity (10/18/2021)   Hunger Vital Sign    Worried About Running Out of Food in the Last Year: Never true    Ran Out of Food in the Last Year: Never true  Transportation Needs: No Transportation Needs (10/18/2021)   PRAPARE -  Administrator, Civil Service (Medical): No    Lack of Transportation (Non-Medical): No  Physical Activity: Not on file  Stress: Not on file  Social Connections: Not on file  Intimate Partner Violence: Not At Risk (10/18/2021)   Humiliation, Afraid, Rape, and Kick questionnaire    Fear of Current or Ex-Partner: No    Emotionally Abused: No    Physically Abused: No    Sexually Abused: No     ROS- All systems are reviewed and negative  except as per the HPI above.  Physical Exam: Vitals:   05/27/22 1046  BP: (!) 146/80  Pulse: 69  Weight: 101.5 kg  Height: 5\' 4"  (1.626 m)     GEN- The patient is a well appearing elderly female, alert and oriented x 3 today.   HEENT-head normocephalic, atraumatic, sclera clear, conjunctiva pink, hearing intact, trachea midline. Lungs- Clear to ausculation bilaterally, normal work of breathing Heart- Regular rate and rhythm, no murmurs, rubs or gallops  GI- soft, NT, ND, + BS Extremities- no clubbing, cyanosis, or edema MS- no significant deformity or atrophy Skin- no rash or lesion Psych- euthymic mood, full affect Neuro- strength and sensation are intact   Wt Readings from Last 3 Encounters:  05/27/22 101.5 kg  05/18/22 105.6 kg  05/06/22 105.7 kg    EKG today demonstrates  SR Vent. rate 69 BPM PR interval 154 ms QRS duration 82 ms QT/QTcB 400/428 ms   Echo 06/10/18 1. The left ventricle has normal systolic function with an ejection fraction of 60-65%. The cavity size was normal. Left ventricular diastolic Doppler parameters are indeterminate. No evidence of left ventricular regional wall motion abnormalities.  2. The right ventricle has normal systolic function. The cavity was normal. There is no increase in right ventricular wall thickness.  3. No evidence of mitral valve stenosis.  4. The aortic valve is tricuspid. No stenosis of the aortic valve.  5. The aortic root, ascending aorta and aortic arch are normal in  size and structure.   Epic records are reviewed at length today  CHA2DS2-VASc Score = 4  The patient's score is based upon: CHF History: 0 HTN History: 1 Diabetes History: 0 Stroke History: 0 Vascular Disease History: 0 Age Score: 2 Gender Score: 1       ASSESSMENT AND PLAN: 1. Paroxysmal Atrial Fibrillation (ICD10:  I48.0) The patient's CHA2DS2-VASc score is 4, indicating a 4.8% annual risk of stroke.   Patient spontaneously converted to SR, possible related to steroid use.  Will start diltiazem 30 mg q 4 hours PRN for heart racing.  Can consider AAD if her afib becomes more frequent.  Continue diltiazem 240 mg daily Continue Eliquis 5 mg BID  2. Secondary Hypercoagulable State (ICD10:  D68.69) The patient is at significant risk for stroke/thromboembolism based upon her CHA2DS2-VASc Score of 4.  Continue Apixaban (Eliquis).   3. Obesity Body mass index is 38.42 kg/m. Lifestyle modification was discussed and encouraged including regular physical activity and weight reduction.  4. OSA Followed by Dr Mayford Knife. Encouraged compliance with CPAP therapy.  5. HTN Stable, no changes today.   Follow up in the AF clinic in 6 months.    Jorja Loa PA-C [facility] [address] [place], [place] [address] [phone] 05/27/2022 10:58 AM

## 2022-05-29 ENCOUNTER — Ambulatory Visit (INDEPENDENT_AMBULATORY_CARE_PROVIDER_SITE_OTHER): Payer: Medicare Other | Admitting: Rehabilitative and Restorative Service Providers"

## 2022-05-29 ENCOUNTER — Encounter: Payer: Self-pay | Admitting: Rehabilitative and Restorative Service Providers"

## 2022-05-29 DIAGNOSIS — R262 Difficulty in walking, not elsewhere classified: Secondary | ICD-10-CM | POA: Diagnosis not present

## 2022-05-29 DIAGNOSIS — M5459 Other low back pain: Secondary | ICD-10-CM

## 2022-05-29 DIAGNOSIS — R293 Abnormal posture: Secondary | ICD-10-CM | POA: Diagnosis not present

## 2022-05-29 DIAGNOSIS — M6281 Muscle weakness (generalized): Secondary | ICD-10-CM

## 2022-05-29 NOTE — Therapy (Signed)
OUTPATIENT PHYSICAL THERAPY TREATMENT  Patient Name: Bridget [name] MRN: [id number] DOB:01-08-1945, 77 y.o., female Today's Date: 05/29/2022  END OF SESSION:  PT End of Session - 05/29/22 1434     Visit Number 4    Date for PT Re-Evaluation 07/11/22    Authorization Type Medicare    Authorization - Visit Number 4    Progress Note Due on Visit 10    PT Start Time 1346    PT Stop Time 1429    PT Time Calculation (min) 43 min    Activity Tolerance Patient tolerated treatment well;No increased pain    Behavior During Therapy WFL for tasks assessed/performed              Past Medical History:  Diagnosis Date   Arthritis    Cancer (HCC)    basal cell cancer removed on face   Hypertension    Seasonal allergies    Sleep apnea    Past Surgical History:  Procedure Laterality Date   BREAST EXCISIONAL BIOPSY Left    CHOLECYSTECTOMY  02/2010   KNEE SURGERY Right    Meniscal tear   OOPHORECTOMY  01/2003   RSO AT [facility]   TOTAL KNEE ARTHROPLASTY Left 10/18/2021   Procedure: LEFT TOTAL KNEE ARTHROPLASTY;  Surgeon: Kathryne Hitch, MD;  Location: [facility];  Service: Orthopedics;  Laterality: Left;   VAGINAL HYSTERECTOMY  01/2003   [facility]O A&P REPAIR   WISDOM TOOTH EXTRACTION     Patient Active Problem List   Diagnosis Date Noted   Status post total left knee replacement 10/18/2021   Degenerative arthritis of left knee 11/24/2019   Hypercoagulable state due to paroxysmal atrial fibrillation (HCC) 01/26/2019   Greater trochanteric bursitis of right hip 09/23/2018   Lumbar radiculopathy 08/12/2018   Gluteal tendonitis of right buttock 07/29/2018   Atrial fibrillation (HCC) 07/21/2018   Arthritis of left acromioclavicular joint 04/26/2018   Partial nontraumatic tear of left rotator cuff 04/26/2018   Hypertension    Seasonal allergies     PCP: Mila Palmer, MD  REFERRING PROVIDER: Rodolph Bong, MD  REFERRING DIAG:  Diagnosis  M54.16 (ICD-10-CM) - Lumbar  radiculopathy  G57.02 (ICD-10-CM) - Piriformis syndrome of left side    Rationale for Evaluation and Treatment: Rehabilitation  THERAPY DIAG:  Abnormal posture  Difficulty in walking, not elsewhere classified  Muscle weakness (generalized)  Other low back pain  ONSET DATE: Early April 2024  SUBJECTIVE:  SUBJECTIVE STATEMENT: [name] notes "some" HEP compliance.  Low back and left leg pain as distal as the calf affects function, particularly with prolonged standing.  PERTINENT HISTORY:  OA, skin cancer, HTN, previous Rt knee scope, atrial fibrillation, Lt TKA  PAIN:  Are you having pain? Yes: NPRS scale: 1-4/10 Pain location: Low back and posterior mid to upper left calf Pain description: Burning in leg and low back ache Aggravating factors: Standing Relieving factors: Walking, sitting, sleeping  PRECAUTIONS: Back  WEIGHT BEARING RESTRICTIONS: No  FALLS:  Has patient fallen in last 6 months? No  LIVING ENVIRONMENT: Lives with: lives with their family and lives with their spouse Lives in: House/apartment Stairs:  Can do reciprocally with hand rail Has following equipment at home: Single point cane, Environmental consultant - 2 wheeled, Tour manager, and Grab bars  OCCUPATION: Retired  PLOF: Independent  PATIENT GOALS: Gardening and go to her grandchildren's  events  NEXT MD VISIT: Later in month  OBJECTIVE:   DIAGNOSTIC FINDINGS:  No recent fracture is seen. There is first-degree anterolisthesis at the L4-L5 level. Degenerative changes are noted with bony spurs and facet hypertrophy. There is marked disc space narrowing at the L5-S1 level.  PATIENT SURVEYS:  FOTO 53 (Goal 65 in 10 visits)  SCREENING FOR RED FLAGS: Bowel or bladder incontinence: No Spinal tumors: No Cauda equina syndrome:  No Compression fracture: No Abdominal aneurysm: No  COGNITION: Overall cognitive status: Within functional limits for tasks assessed     SENSATION: Occasional left posterior thigh burning, more frequent left calf burning  MUSCLE LENGTH: Hamstrings: Right 40 deg; Left 40 deg  POSTURE: rounded shoulders, forward head, and decreased lumbar lordosis   AROM eval  Flexion   Extension 0 (less than 5 degrees)  Right lateral flexion   Left lateral flexion   Right rotation   Left rotation    (Blank rows = not tested)  LOWER EXTREMITY ROM:     Passive  Left/Right 05/16/2022   Hip flexion 90/90   Hip extension    Hip abduction    Hip adduction    Hip internal rotation 18/-3   Hip external rotation 22/35   Knee flexion    Knee extension    Ankle dorsiflexion    Ankle plantarflexion    Ankle inversion    Ankle eversion     (Blank rows = not tested)  LOWER EXTREMITY STRENGTH:    MMT Right eval Left eval  Hip flexion    Hip extension    Hip abduction    Hip adduction    Hip internal rotation    Hip external rotation    Knee flexion    Knee extension    Ankle dorsiflexion    Ankle plantarflexion    Ankle inversion    Ankle eversion    Spine strength Objective strength testing was deferred secondary to discomfort with test positioning    (Blank rows = not tested)  GAIT: Distance walked: 100 feet Assistive device utilized: None Level of assistance: Complete Independence Comments: Slight forward flexion at the hips and increasing pain with prolonged periods of standing and walking  TODAY'S TREATMENT:  DATE:  05/29/2022 Lumbar extension AROM 2 sets of 10 for 3 seconds Shoulder blade pinches 2 sets of 10 for 5 seconds Bridging 10X 5 seconds Figure 4 stretch 5X 20 seconds Alternating hip hike 2 sets of 10 for 3 seconds Alternating standing hip  extensions 2 sets of 10 for 3 seconds Rows with Blue Thera Band 20X 3 seconds  Functional Activities: Log roll, walking prescription and postural education (taking breaks during the day and at grandchildren events to change position and incorporate exercises)   05/27/2022 Nu step L5 UE/LE seat #8 X 6 min Slantboard gastroc stretch 30 sec X 3 bilat Standing heel and toe raises X 15 Standing hip extension X 15 bilat with red band Standing hip abduction X 15 bilat with red band Seated lumbar flexion stretch pball roll outs 5 sec X 10 Shoulder Rows red X 15 Shoulder Extensions red X 15 Supine hamstring stretch 10 sec X 5 with strap Supine LTR 10 sec X 5 bilat Bridging 10 x 5 seconds Standing trunk extension AROM 10 x 3 seconds   DATE: 05/20/2022 Nu step L5 UE/LE seat #8 X 6 min Slantboard gastroc stretch 20 sec X 3 bilat Standing hip extension X 15 bilat Standing hip abduction X 10 bilat Shoulder Rows red X 15 Shoulder Extensions red X 15 Supine hamstring stretch 10 sec X 5 with strap Supine LTR 10 sec X 5 bilat Bridging 10 x 5 seconds Standing trunk extension AROM 10 x 3 seconds   DATE: 05/16/2022 Standing trunk extension AROM 10 x 3 seconds Shoulder blade pinches 10 x 5 seconds Bridging 10 x 5 seconds   PATIENT EDUCATION:  Education details: Reviewed exam findings and home exercise program Person educated: Patient Education method: Explanation, Demonstration, Tactile cues, Verbal cues, and Handouts Education comprehension: verbalized understanding, returned demonstration, verbal cues required, tactile cues required, and needs further education  HOME EXERCISE PROGRAM: [id number]  ASSESSMENT:  CLINICAL IMPRESSION: Aarohi will benefit from increased home exercise compliance.  Low back, gluteal and left leg symptoms limit prolonged standing.  Her current program is addressing this and her prognosis to improve is good with continued work.  Emphasis remains postural, lumbar  extensors, hip abductors and quadratus lumborum strengthening along with continued practical and body mechanics work.  OBJECTIVE IMPAIRMENTS: Abnormal gait, decreased activity tolerance, decreased endurance, decreased knowledge of condition, difficulty walking, decreased ROM, decreased strength, decreased safety awareness, impaired perceived functional ability, impaired flexibility, improper body mechanics, postural dysfunction, obesity, and pain.   ACTIVITY LIMITATIONS: lifting, bending, standing, bed mobility, and locomotion level  PARTICIPATION LIMITATIONS: interpersonal relationship and community activity  PERSONAL FACTORS: OA, skin cancer, HTN, previous Rt knee scope, atrial fibrillation, Lt TKA are also affecting patient's functional outcome.   REHAB POTENTIAL: Good  CLINICAL DECISION MAKING: Stable/uncomplicated  EVALUATION COMPLEXITY: Low   GOALS: Goals reviewed with patient? Yes  SHORT TERM GOALS: Target date: 06/13/2022   Improve radicular symptoms-free lumbar extension to at least 5 degrees Baseline: Radicular symptoms at < 5 degrees Goal status: On Going 05/29/2022  2.  Aymar will be independent with her day 1 HEP Baseline: Started 05/16/2022 Goal status: On Going 05/29/2022  3.  Improve bilateral hamstrings flexibility to 45 degrees and hip ER to 40 degrees Baseline: 40 degrees hamstrings and 22/35 degrees hip ER Goal status: INITIAL   LONG TERM GOALS: Target date: 07/11/2022  Improve FOTO to 65 in 10 visits Baseline: 53 Goal status: INITIAL  2.  Tajanique will report low back pain consistently <  3/10 on the Numeric Pain Rating Scale with no complaints of peripheral symptoms Baseline: Low back and left posterior thigh/calf 1-4/10 Goal status: On Going 05/29/2022  3.  Gerline will be able to perform ADLs around the house including bed mobility with correct spine/body mechanics Baseline: Education started 05/16/2022 Goal status: On Going 05/29/2022  4.  Keelia will be  independent with her long-term HEP at DC Baseline: Started 05/16/2022 Goal status: INITIAL  PLAN:  PT FREQUENCY: 1-2x/week  PT DURATION: 8 weeks  PLANNED INTERVENTIONS: Therapeutic exercises, Therapeutic activity, Neuromuscular re-education, Gait training, Patient/Family education, Self Care, Joint mobilization, Dry Needling, Cryotherapy, Moist heat, Traction, and Manual therapy.  PLAN FOR NEXT SESSION: Review HEP and hamstrings stretch, Postural and body mechanics education and practical work.  Hip abductors, scapular retractors and lumbar extensors strength work.   Cherlyn Cushing, PT, MPT 05/29/2022, 3:39 PM

## 2022-05-30 ENCOUNTER — Telehealth: Payer: Self-pay | Admitting: Pharmacist

## 2022-05-30 NOTE — Telephone Encounter (Signed)
Patient has pre diabetes but recently took round of prednisone and blood glucose increased to > 200. A1c was checked by Dr Paulino Rily and was 6.1%.  Patient has been instructed to test blood glucose at home but she not been able to get glucometer.  [facility] has AccuChek Guide test strips ready for pick up but glucometer was not covered and no Rx for lancets per [facility].  Instructed patient that she can buy glucometer without Rx for about $20 to 30.  We can also provide an Accu-Chek Glucometer sample to her.   Notified Dr Ali Lowe office that patient will get Accu-Chek Guide Me glucometer sample. Requested Rx to be sent to [facility] for Accu-Chek softclix lancets.

## 2022-06-04 ENCOUNTER — Ambulatory Visit (INDEPENDENT_AMBULATORY_CARE_PROVIDER_SITE_OTHER): Payer: Medicare Other | Admitting: Rehabilitative and Restorative Service Providers"

## 2022-06-04 ENCOUNTER — Encounter: Payer: Self-pay | Admitting: Rehabilitative and Restorative Service Providers"

## 2022-06-04 DIAGNOSIS — R262 Difficulty in walking, not elsewhere classified: Secondary | ICD-10-CM

## 2022-06-04 DIAGNOSIS — M5459 Other low back pain: Secondary | ICD-10-CM

## 2022-06-04 DIAGNOSIS — M6281 Muscle weakness (generalized): Secondary | ICD-10-CM | POA: Diagnosis not present

## 2022-06-04 DIAGNOSIS — R293 Abnormal posture: Secondary | ICD-10-CM | POA: Diagnosis not present

## 2022-06-04 NOTE — Therapy (Signed)
OUTPATIENT PHYSICAL THERAPY TREATMENT  Patient Name: Bridget [name] MRN: [id number] DOB:Nov 12, 1945, 77 y.o., female Today's Date: 06/04/2022  END OF SESSION:  PT End of Session - 06/04/22 1058     Visit Number 5    Date for PT Re-Evaluation 07/11/22    Authorization Type Medicare    Authorization - Visit Number 5    Progress Note Due on Visit 10    PT Start Time 1057    PT Stop Time 1143    PT Time Calculation (min) 46 min    Activity Tolerance Patient tolerated treatment well;No increased pain;Patient limited by pain    Behavior During Therapy [facility] for tasks assessed/performed               Past Medical History:  Diagnosis Date   Arthritis    Cancer (HCC)    basal cell cancer removed on face   Hypertension    Seasonal allergies    Sleep apnea    Past Surgical History:  Procedure Laterality Date   BREAST EXCISIONAL BIOPSY Left    CHOLECYSTECTOMY  02/2010   KNEE SURGERY Right    Meniscal tear   OOPHORECTOMY  01/2003   RSO AT [facility]   TOTAL KNEE ARTHROPLASTY Left 10/18/2021   Procedure: LEFT TOTAL KNEE ARTHROPLASTY;  Surgeon: Bridget Hitch, MD;  Location: [facility];  Service: Orthopedics;  Laterality: Left;   VAGINAL HYSTERECTOMY  01/2003   TVH,RSO A&P REPAIR   WISDOM TOOTH EXTRACTION     Patient Active Problem List   Diagnosis Date Noted   Status post total left knee replacement 10/18/2021   Degenerative arthritis of left knee 11/24/2019   Hypercoagulable state due to paroxysmal atrial fibrillation (HCC) 01/26/2019   Greater trochanteric bursitis of right hip 09/23/2018   Lumbar radiculopathy 08/12/2018   Gluteal tendonitis of right buttock 07/29/2018   Atrial fibrillation (HCC) 07/21/2018   Arthritis of left acromioclavicular joint 04/26/2018   Partial nontraumatic tear of left rotator cuff 04/26/2018   Hypertension    Seasonal allergies     PCP: Bridget Palmer, MD  REFERRING PROVIDER: Rodolph Bong, MD  REFERRING DIAG:  Diagnosis  M54.16  (ICD-10-CM) - Lumbar radiculopathy  G57.02 (ICD-10-CM) - Piriformis syndrome of left side    Rationale for Evaluation and Treatment: Rehabilitation  THERAPY DIAG:  Abnormal posture  Difficulty in walking, not elsewhere classified  Muscle weakness (generalized)  Other low back pain  ONSET DATE: Early April 2024  SUBJECTIVE:  SUBJECTIVE STATEMENT: Bridget [name] notes low back and left leg pain as distal as the calf has been significant the past 5 days.  This is consistent with extra time flexed over the sink washing dishes and trimming hedges.  PERTINENT HISTORY:  OA, skin cancer, HTN, previous Rt knee scope, atrial fibrillation, Lt TKA  PAIN:  Are you having pain? Yes: NPRS scale: 2-5/10 Pain location: Low back and posterior mid to middle left calf Pain description: Burning in leg and low back ache Aggravating factors: Standing, prolonged postures Relieving factors: Walking, sleeping  PRECAUTIONS: Back  WEIGHT BEARING RESTRICTIONS: No  FALLS:  Has patient fallen in last 6 months? No  LIVING ENVIRONMENT: Lives with: lives with their family and lives with their spouse Lives in: House/apartment Stairs:  Can do reciprocally with hand rail Has following equipment at home: Single point cane, Environmental consultant - 2 wheeled, Tour manager, and Grab bars  OCCUPATION: Retired  PLOF: Independent  PATIENT GOALS: Gardening and go to her grandchildren's  events  NEXT MD VISIT: Later in month  OBJECTIVE:   DIAGNOSTIC FINDINGS:  No recent fracture is seen. There is first-degree anterolisthesis at the L4-L5 level. Degenerative changes are noted with bony spurs and facet hypertrophy. There is marked disc space narrowing at the L5-S1 level.  PATIENT SURVEYS:  FOTO 53 (Goal 65 in 10 visits)  SCREENING FOR RED  FLAGS: Bowel or bladder incontinence: No Spinal tumors: No Cauda equina syndrome: No Compression fracture: No Abdominal aneurysm: No  COGNITION: Overall cognitive status: Within functional limits for tasks assessed     SENSATION: Occasional left posterior thigh burning, more frequent left calf burning  MUSCLE LENGTH: Hamstrings: Right 40 deg; Left 40 deg  POSTURE: rounded shoulders, forward head, and decreased lumbar lordosis   AROM eval  Flexion   Extension 0 (less than 5 degrees)  Right lateral flexion   Left lateral flexion   Right rotation   Left rotation    (Blank rows = not tested)  LOWER EXTREMITY ROM:     Passive  Left/Right 05/16/2022   Hip flexion 90/90   Hip extension    Hip abduction    Hip adduction    Hip internal rotation 18/-3   Hip external rotation 22/35   Knee flexion    Knee extension    Ankle dorsiflexion    Ankle plantarflexion    Ankle inversion    Ankle eversion     (Blank rows = not tested)  LOWER EXTREMITY STRENGTH:    MMT EVAL   Hip flexion    Hip extension    Hip abduction    Hip adduction    Hip internal rotation    Hip external rotation    Knee flexion    Knee extension    Ankle dorsiflexion    Ankle plantarflexion    Ankle inversion    Ankle eversion    Spine strength Objective strength testing was deferred secondary to discomfort with test positioning    (Blank rows = not tested)  GAIT: Distance walked: 100 feet Assistive device utilized: None Level of assistance: Complete Independence Comments: Slight forward flexion at the hips and increasing pain with prolonged periods of standing and walking  TODAY'S TREATMENT:  DATE:  06/04/2022 Lumbar extension AROM 10 for 3 seconds (limited range to avoid radiculopathy) Shoulder blade pinches 10 for 5 seconds Bridging 10X 5 seconds (limited range to  avoid radiculopathy) Figure 4 stretch 5X 20 seconds Alternating hip hike 2 sets of 10 for 3 seconds (needed feedback and correction) Alternating standing hip extensions 2 sets of 10 for 3 seconds (needed feedback and correction)  Functional Activities: Log roll, walking prescription review and postural education (washing dishes and trimming bushes mechanics, reviewed spine anatomy and related to imaging/degenerative changes, disc pressures in various postures)   05/29/2022 Lumbar extension AROM 2 sets of 10 for 3 seconds Shoulder blade pinches 2 sets of 10 for 5 seconds Bridging 10X 5 seconds Figure 4 stretch 5X 20 seconds Alternating hip hike 2 sets of 10 for 3 seconds Alternating standing hip extensions 2 sets of 10 for 3 seconds Rows with Blue Thera Band 20X 3 seconds  Functional Activities: Log roll, walking prescription and postural education (taking breaks during the day and at grandchildren events to change position and incorporate exercises)   05/27/2022 Nu step L5 UE/LE seat #8 X 6 min Slantboard gastroc stretch 30 sec X 3 bilat Standing heel and toe raises X 15 Standing hip extension X 15 bilat with red band Standing hip abduction X 15 bilat with red band Seated lumbar flexion stretch pball roll outs 5 sec X 10 Shoulder Rows red X 15 Shoulder Extensions red X 15 Supine hamstring stretch 10 sec X 5 with strap Supine LTR 10 sec X 5 bilat Bridging 10 x 5 seconds Standing trunk extension AROM 10 x 3 seconds   PATIENT EDUCATION:  Education details: Reviewed exam findings and home exercise program Person educated: Patient Education method: Explanation, Demonstration, Tactile cues, Verbal cues, and Handouts Education comprehension: verbalized understanding, returned demonstration, verbal cues required, tactile cues required, and needs further education  HOME EXERCISE PROGRAM: [id number]  ASSESSMENT:  CLINICAL IMPRESSION: Terrill had a rough past few days due to poor body  mechanics with washing dishes at church and with trimming hedges around her home.  We reviewed spine anatomy and correlated it to her imaging findings.  We reviewed the importance of avoiding flexion both for lumbar muscle strain and pressure on the disc.  We reviewed the disc pressure in various positions chart to reinforce the importance of maintaining good posture and avoiding slouched, flexed postures.  Bharti did a much better job with her exercises after correction and is aware she will need to maintain excellent postural awareness to facilitate meeting long-term goals.  OBJECTIVE IMPAIRMENTS: Abnormal gait, decreased activity tolerance, decreased endurance, decreased knowledge of condition, difficulty walking, decreased ROM, decreased strength, decreased safety awareness, impaired perceived functional ability, impaired flexibility, improper body mechanics, postural dysfunction, obesity, and pain.   ACTIVITY LIMITATIONS: lifting, bending, standing, bed mobility, and locomotion level  PARTICIPATION LIMITATIONS: interpersonal relationship and community activity  PERSONAL FACTORS: OA, skin cancer, HTN, previous Rt knee scope, atrial fibrillation, Lt TKA are also affecting patient's functional outcome.   REHAB POTENTIAL: Good  CLINICAL DECISION MAKING: Stable/uncomplicated  EVALUATION COMPLEXITY: Low   GOALS: Goals reviewed with patient? Yes  SHORT TERM GOALS: Target date: 06/13/2022   Improve radicular symptoms-free lumbar extension to at least 5 degrees Baseline: Radicular symptoms at < 5 degrees Goal status: On Going 06/04/2022  2.  Chrystal will be independent with her day 1 HEP Baseline: Started 05/16/2022 Goal status: On Going 06/04/2022  3.  Improve bilateral hamstrings flexibility to 45 degrees and  hip ER to 40 degrees Baseline: 40 degrees hamstrings and 22/35 degrees hip ER Goal status: INITIAL   LONG TERM GOALS: Target date: 07/11/2022  Improve FOTO to 65 in 10  visits Baseline: 53 Goal status: INITIAL  2.  Miryam will report low back pain consistently < 3/10 on the Numeric Pain Rating Scale with no complaints of peripheral symptoms Baseline: Low back and left posterior thigh/calf 1-4/10 Goal status: On Going 06/04/2022  3.  Miriya will be able to perform ADLs around the house including bed mobility with correct spine/body mechanics Baseline: Education started 05/16/2022 Goal status: On Going 06/04/2022  4.  Audrei will be independent with her long-term HEP at [place] Baseline: Started 05/16/2022 Goal status: INITIAL  PLAN:  PT FREQUENCY: 1-2x/week  PT DURATION: 8 weeks  PLANNED INTERVENTIONS: Therapeutic exercises, Therapeutic activity, Neuromuscular re-education, Gait training, Patient/Family education, Self Care, Joint mobilization, Dry Needling, Cryotherapy, Moist heat, Traction, and Manual therapy.  PLAN FOR NEXT SESSION: Review HEP, add hamstrings stretch, Postural and body mechanics education and practical work.  Hip abductors, scapular retractors and lumbar extensors strength work progressions as appropriate.   Cherlyn Cushing, PT, MPT 06/04/2022, 11:52 AM

## 2022-06-05 ENCOUNTER — Ambulatory Visit: Payer: Medicare Other | Admitting: Family Medicine

## 2022-06-11 ENCOUNTER — Encounter: Payer: Self-pay | Admitting: Rehabilitative and Restorative Service Providers"

## 2022-06-11 ENCOUNTER — Ambulatory Visit (INDEPENDENT_AMBULATORY_CARE_PROVIDER_SITE_OTHER): Payer: Medicare Other | Admitting: Rehabilitative and Restorative Service Providers"

## 2022-06-11 DIAGNOSIS — M6281 Muscle weakness (generalized): Secondary | ICD-10-CM | POA: Diagnosis not present

## 2022-06-11 DIAGNOSIS — H5203 Hypermetropia, bilateral: Secondary | ICD-10-CM | POA: Diagnosis not present

## 2022-06-11 DIAGNOSIS — M5459 Other low back pain: Secondary | ICD-10-CM

## 2022-06-11 DIAGNOSIS — R262 Difficulty in walking, not elsewhere classified: Secondary | ICD-10-CM

## 2022-06-11 DIAGNOSIS — H35373 Puckering of macula, bilateral: Secondary | ICD-10-CM | POA: Diagnosis not present

## 2022-06-11 DIAGNOSIS — H524 Presbyopia: Secondary | ICD-10-CM | POA: Diagnosis not present

## 2022-06-11 DIAGNOSIS — H2513 Age-related nuclear cataract, bilateral: Secondary | ICD-10-CM | POA: Diagnosis not present

## 2022-06-11 DIAGNOSIS — H43813 Vitreous degeneration, bilateral: Secondary | ICD-10-CM | POA: Diagnosis not present

## 2022-06-11 DIAGNOSIS — R293 Abnormal posture: Secondary | ICD-10-CM | POA: Diagnosis not present

## 2022-06-11 DIAGNOSIS — H52223 Regular astigmatism, bilateral: Secondary | ICD-10-CM | POA: Diagnosis not present

## 2022-06-11 NOTE — Therapy (Signed)
OUTPATIENT PHYSICAL THERAPY TREATMENT  Patient Name: Bridget [name] MRN: [id number] DOB:17-Mar-1945, 77 y.o., female Today's Date: 06/11/2022  END OF SESSION:  PT End of Session - 06/11/22 1253     Visit Number 6    Date for PT Re-Evaluation 07/11/22    Authorization Type Medicare    Authorization - Visit Number 6    Progress Note Due on Visit 10    PT Start Time 1252    PT Stop Time 1332    PT Time Calculation (min) 40 min    Activity Tolerance Patient tolerated treatment well;No increased pain    Behavior During Therapy WFL for tasks assessed/performed                Past Medical History:  Diagnosis Date   Arthritis    Cancer (HCC)    basal cell cancer removed on face   Hypertension    Seasonal allergies    Sleep apnea    Past Surgical History:  Procedure Laterality Date   BREAST EXCISIONAL BIOPSY Left    CHOLECYSTECTOMY  02/2010   KNEE SURGERY Right    Meniscal tear   OOPHORECTOMY  01/2003   RSO AT [facility]   TOTAL KNEE ARTHROPLASTY Left 10/18/2021   Procedure: LEFT TOTAL KNEE ARTHROPLASTY;  Surgeon: Kathryne Hitch, MD;  Location: [facility];  Service: Orthopedics;  Laterality: Left;   VAGINAL HYSTERECTOMY  01/2003   [facility]O A&P REPAIR   WISDOM TOOTH EXTRACTION     Patient Active Problem List   Diagnosis Date Noted   Status post total left knee replacement 10/18/2021   Degenerative arthritis of left knee 11/24/2019   Hypercoagulable state due to paroxysmal atrial fibrillation (HCC) 01/26/2019   Greater trochanteric bursitis of right hip 09/23/2018   Lumbar radiculopathy 08/12/2018   Gluteal tendonitis of right buttock 07/29/2018   Atrial fibrillation (HCC) 07/21/2018   Arthritis of left acromioclavicular joint 04/26/2018   Partial nontraumatic tear of left rotator cuff 04/26/2018   Hypertension    Seasonal allergies     PCP: Mila Palmer, MD  REFERRING PROVIDER: Rodolph Bong, MD  REFERRING DIAG:  Diagnosis  M54.16 (ICD-10-CM) - Lumbar  radiculopathy  G57.02 (ICD-10-CM) - Piriformis syndrome of left side    Rationale for Evaluation and Treatment: Rehabilitation  THERAPY DIAG:  Abnormal posture  Difficulty in walking, not elsewhere classified  Muscle weakness (generalized)  Other low back pain  ONSET DATE: Early April 2024  SUBJECTIVE:  SUBJECTIVE STATEMENT: Bridget [name] notes low back and left leg pain as distal as the calf has been significant the past 5 days.  This is consistent with extra time flexed over the sink washing dishes and trimming hedges.  PERTINENT HISTORY:  OA, skin cancer, HTN, previous Rt knee scope, atrial fibrillation, Lt TKA  PAIN:  Are you having pain? Yes: NPRS scale: 2-5/10 Pain location: Low back and posterior mid to middle left calf Pain description: Burning in leg and low back ache Aggravating factors: Standing, prolonged postures Relieving factors: Walking, sleeping  PRECAUTIONS: Back  WEIGHT BEARING RESTRICTIONS: No  FALLS:  Has patient fallen in last 6 months? No  LIVING ENVIRONMENT: Lives with: lives with their family and lives with their spouse Lives in: House/apartment Stairs:  Can do reciprocally with hand rail Has following equipment at home: Single point cane, Environmental consultant - 2 wheeled, Tour manager, and Grab bars  OCCUPATION: Retired  PLOF: Independent  PATIENT GOALS: Gardening and go to her grandchildren's  events  NEXT MD VISIT: Later in month  OBJECTIVE:   DIAGNOSTIC FINDINGS:  No recent fracture is seen. There is first-degree anterolisthesis at the L4-L5 level. Degenerative changes are noted with bony spurs and facet hypertrophy. There is marked disc space narrowing at the L5-S1 level.  PATIENT SURVEYS:  FOTO 53 (Goal 65 in 10 visits)  SCREENING FOR RED FLAGS: Bowel or  bladder incontinence: No Spinal tumors: No Cauda equina syndrome: No Compression fracture: No Abdominal aneurysm: No  COGNITION: Overall cognitive status: Within functional limits for tasks assessed     SENSATION: Occasional left posterior thigh burning, more frequent left calf burning  MUSCLE LENGTH: Hamstrings: Right 40 deg; Left 40 deg  POSTURE: rounded shoulders, forward head, and decreased lumbar lordosis   AROM eval  Flexion   Extension 0 (less than 5 degrees)  Right lateral flexion   Left lateral flexion   Right rotation   Left rotation    (Blank rows = not tested)  LOWER EXTREMITY ROM:     Passive  Left/Right 05/16/2022   Hip flexion 90/90   Hip extension    Hip abduction    Hip adduction    Hip internal rotation 18/-3   Hip external rotation 22/35   Knee flexion    Knee extension    Ankle dorsiflexion    Ankle plantarflexion    Ankle inversion    Ankle eversion     (Blank rows = not tested)  LOWER EXTREMITY STRENGTH:    MMT EVAL   Hip flexion    Hip extension    Hip abduction    Hip adduction    Hip internal rotation    Hip external rotation    Knee flexion    Knee extension    Ankle dorsiflexion    Ankle plantarflexion    Ankle inversion    Ankle eversion    Spine strength Objective strength testing was deferred secondary to discomfort with test positioning    (Blank rows = not tested)  GAIT: Distance walked: 100 feet Assistive device utilized: None Level of assistance: Complete Independence Comments: Slight forward flexion at the hips and increasing pain with prolonged periods of standing and walking  TODAY'S TREATMENT:  DATE:  06/11/2022 Lumbar extension AROM 10 for 3 seconds (limited range to avoid radiculopathy) Shoulder blade pinches 10 for 5 seconds Bridging 2 sets of 10 5 seconds (limited range to avoid  radiculopathy) Figure 4 stretch 5X 20 seconds Hamstrings stretch 5X 20 seconds (other leg straight) Alternating hip hike 2 sets of 10 for 3 seconds (needed feedback and correction) Alternating standing hip extensions 2 sets of 10 for 3 seconds (needed feedback and correction)   06/04/2022 Lumbar extension AROM 10 for 3 seconds (limited range to avoid radiculopathy) Shoulder blade pinches 10 for 5 seconds Bridging 10X 5 seconds (limited range to avoid radiculopathy) Figure 4 stretch 5X 20 seconds Alternating hip hike 2 sets of 10 for 3 seconds (needed feedback and correction) Alternating standing hip extensions 2 sets of 10 for 3 seconds (needed feedback and correction)  Functional Activities: Log roll, walking prescription review and postural education (washing dishes and trimming bushes mechanics, reviewed spine anatomy and related to imaging/degenerative changes, disc pressures in various postures)   05/29/2022 Lumbar extension AROM 2 sets of 10 for 3 seconds Shoulder blade pinches 2 sets of 10 for 5 seconds Bridging 10X 5 seconds Figure 4 stretch 5X 20 seconds Alternating hip hike 2 sets of 10 for 3 seconds Alternating standing hip extensions 2 sets of 10 for 3 seconds Rows with Blue Thera Band 20X 3 seconds  Functional Activities: Log roll, walking prescription and postural education (taking breaks during the day and at grandchildren events to change position and incorporate exercises)   PATIENT EDUCATION:  Education details: Reviewed exam findings and home exercise program Person educated: Patient Education method: Explanation, Demonstration, Tactile cues, Verbal cues, and Handouts Education comprehension: verbalized understanding, returned demonstration, verbal cues required, tactile cues required, and needs further education  HOME EXERCISE PROGRAM: [id number]  ASSESSMENT:  CLINICAL IMPRESSION: Bridget [name] reports a decrease in the intensity and range of her peripheral symptoms  over the past week.  She reports being very aware of her posture and working very hard to avoid prolonged sitting and flexion.  She notes that she has been walking 4 times for 15-20 minutes over the past week and has noted her symptoms do not go as far down her leg, nor are they as intense as they were the week prior.  We progressed her strengthening today and noted weakness on the left side with many of her strength activities.  Bridget [name] is still on track to meet long-term goals established at evaluation.  OBJECTIVE IMPAIRMENTS: Abnormal gait, decreased activity tolerance, decreased endurance, decreased knowledge of condition, difficulty walking, decreased ROM, decreased strength, decreased safety awareness, impaired perceived functional ability, impaired flexibility, improper body mechanics, postural dysfunction, obesity, and pain.   ACTIVITY LIMITATIONS: lifting, bending, standing, bed mobility, and locomotion level  PARTICIPATION LIMITATIONS: interpersonal relationship and community activity  PERSONAL FACTORS: OA, skin cancer, HTN, previous Rt knee scope, atrial fibrillation, Lt TKA are also affecting patient's functional outcome.   REHAB POTENTIAL: Good  CLINICAL DECISION MAKING: Stable/uncomplicated  EVALUATION COMPLEXITY: Low   GOALS: Goals reviewed with patient? Yes  SHORT TERM GOALS: Target date: 06/13/2022   Improve radicular symptoms-free lumbar extension to at least 5 degrees Baseline: Radicular symptoms at < 5 degrees Goal status: On Going 06/11/2022  2.  Bridget [name] will be independent with her day 1 HEP Baseline: Started 05/16/2022 Goal status: Met 06/11/2022  3.  Improve bilateral hamstrings flexibility to 45 degrees and hip ER to 40 degrees Baseline: 40 degrees hamstrings and 22/35 degrees hip ER Goal  status: INITIAL   LONG TERM GOALS: Target date: 07/11/2022  Improve FOTO to 65 in 10 visits Baseline: 53 Goal status: INITIAL  2.  Bridget [name] will report low back pain consistently  < 3/10 on the Numeric Pain Rating Scale with no complaints of peripheral symptoms Baseline: Low back and left posterior thigh/calf 1-4/10 Goal status: On Going 06/11/2022  3.  Bridget [name] will be able to perform ADLs around the house including bed mobility with correct spine/body mechanics Baseline: Education started 05/16/2022 Goal status: Partially Met 06/11/2022  4.  Bridget [name] will be independent with her long-term HEP at DC Baseline: Started 05/16/2022 Goal status: INITIAL  PLAN:  PT FREQUENCY: 1-2x/week  PT DURATION: 8 weeks  PLANNED INTERVENTIONS: Therapeutic exercises, Therapeutic activity, Neuromuscular re-education, Gait training, Patient/Family education, Self Care, Joint mobilization, Dry Needling, Cryotherapy, Moist heat, Traction, and Manual therapy.  PLAN FOR NEXT SESSION: FOTO and progress note.  Hip abductors, scapular retractors and lumbar extensors strength work progressions as appropriate.   Cherlyn Cushing, PT, MPT 06/11/2022, 1:42 PM

## 2022-06-13 ENCOUNTER — Ambulatory Visit (INDEPENDENT_AMBULATORY_CARE_PROVIDER_SITE_OTHER): Payer: Medicare Other | Admitting: Rehabilitative and Restorative Service Providers"

## 2022-06-13 ENCOUNTER — Encounter: Payer: Self-pay | Admitting: Rehabilitative and Restorative Service Providers"

## 2022-06-13 DIAGNOSIS — R262 Difficulty in walking, not elsewhere classified: Secondary | ICD-10-CM

## 2022-06-13 DIAGNOSIS — R293 Abnormal posture: Secondary | ICD-10-CM

## 2022-06-13 DIAGNOSIS — M6281 Muscle weakness (generalized): Secondary | ICD-10-CM

## 2022-06-13 DIAGNOSIS — M5459 Other low back pain: Secondary | ICD-10-CM

## 2022-06-13 NOTE — Therapy (Signed)
OUTPATIENT PHYSICAL THERAPY TREATMENT/PROGRESS NOTE  Patient Name: Bridget [name] MRN: [id number] DOB:1945-06-16, 77 y.o., female Today's Date: 06/13/2022  END OF SESSION:  PT End of Session - 06/13/22 1144     Visit Number 7    Date for PT Re-Evaluation 07/11/22    Authorization Type Medicare    Authorization - Visit Number 7    Progress Note Due on Visit 10    PT Start Time 1143    PT Stop Time 1230    PT Time Calculation (min) 47 min    Activity Tolerance Patient tolerated treatment well;No increased pain    Behavior During Therapy [facility] for tasks assessed/performed            Progress Note Reporting Period 05/16/2022 to 06/13/2022  See note below for Objective Data and Assessment of Progress/Goals.     Past Medical History:  Diagnosis Date   Arthritis    Cancer (HCC)    basal cell cancer removed on face   Hypertension    Seasonal allergies    Sleep apnea    Past Surgical History:  Procedure Laterality Date   BREAST EXCISIONAL BIOPSY Left    CHOLECYSTECTOMY  02/2010   KNEE SURGERY Right    Meniscal tear   OOPHORECTOMY  01/2003   RSO AT [facility]   TOTAL KNEE ARTHROPLASTY Left 10/18/2021   Procedure: LEFT TOTAL KNEE ARTHROPLASTY;  Surgeon: Kathryne Hitch, MD;  Location: [facility];  Service: Orthopedics;  Laterality: Left;   VAGINAL HYSTERECTOMY  01/2003   TVH,RSO A&P REPAIR   WISDOM TOOTH EXTRACTION     Patient Active Problem List   Diagnosis Date Noted   Status post total left knee replacement 10/18/2021   Degenerative arthritis of left knee 11/24/2019   Hypercoagulable state due to paroxysmal atrial fibrillation (HCC) 01/26/2019   Greater trochanteric bursitis of right hip 09/23/2018   Lumbar radiculopathy 08/12/2018   Gluteal tendonitis of right buttock 07/29/2018   Atrial fibrillation (HCC) 07/21/2018   Arthritis of left acromioclavicular joint 04/26/2018   Partial nontraumatic tear of left rotator cuff 04/26/2018   Hypertension    Seasonal  allergies     PCP: Mila Palmer, MD  REFERRING PROVIDER: Rodolph Bong, MD  REFERRING DIAG:  Diagnosis  M54.16 (ICD-10-CM) - Lumbar radiculopathy  G57.02 (ICD-10-CM) - Piriformis syndrome of left side    Rationale for Evaluation and Treatment: Rehabilitation  THERAPY DIAG:  Abnormal posture  Difficulty in walking, not elsewhere classified  Muscle weakness (generalized)  Other low back pain  ONSET DATE: Early April 2024  SUBJECTIVE:  SUBJECTIVE STATEMENT: Joel notes low back and left leg pain as distal as the knee (was calf) has been significant the past few days.  This is consistent with extra time flexed over the sink washing dishes, vacuuming and trimming hedges.  PERTINENT HISTORY:  OA, skin cancer, HTN, previous Rt knee scope, atrial fibrillation, Lt TKA  PAIN:  Are you having pain? Yes: NPRS scale: 2-5/10 Pain location: Low back and posterior thigh (was mid to middle left calf) Pain description: Burning in leg and low back ache Aggravating factors: Standing, prolonged postures Relieving factors: Walking, sleeping  PRECAUTIONS: Back  WEIGHT BEARING RESTRICTIONS: No  FALLS:  Has patient fallen in last 6 months? No  LIVING ENVIRONMENT: Lives with: lives with their family and lives with their spouse Lives in: House/apartment Stairs:  Can do reciprocally with hand rail Has following equipment at home: Single point cane, Environmental consultant - 2 wheeled, Tour manager, and Grab bars  OCCUPATION: Retired  PLOF: Independent  PATIENT GOALS: Gardening and go to her grandchildren's  events  NEXT MD VISIT: Later in month  OBJECTIVE:   DIAGNOSTIC FINDINGS:  No recent fracture is seen. There is first-degree anterolisthesis at the L4-L5 level. Degenerative changes are noted with bony spurs  and facet hypertrophy. There is marked disc space narrowing at the L5-S1 level.  PATIENT SURVEYS:  06/13/2022 FOTO 58  Eval FOTO 53 (Goal 65 in 10 visits)  SCREENING FOR RED FLAGS: Bowel or bladder incontinence: No Spinal tumors: No Cauda equina syndrome: No Compression fracture: No Abdominal aneurysm: No  COGNITION: Overall cognitive status: Within functional limits for tasks assessed     SENSATION: Occasional left posterior thigh burning, more frequent left calf burning  MUSCLE LENGTH: 06/13/2022 Hamstrings: Right 50 deg; Left 50 deg  Eval Hamstrings: Right 40 deg; Left 40 deg  POSTURE: rounded shoulders, forward head, and decreased lumbar lordosis   AROM eval 06/13/2022  Flexion    Extension 0 (less than 5 degrees) 5 degrees  Right lateral flexion    Left lateral flexion    Right rotation    Left rotation     (Blank rows = not tested)  LOWER EXTREMITY ROM:     Passive  Left/Right 05/16/2022 Left/Right 06/13/2022  Hip flexion 90/90 95/95  Hip extension    Hip abduction    Hip adduction    Hip internal rotation 18/-3 23/3  Hip external rotation 22/35 25/40  Knee flexion    Knee extension    Ankle dorsiflexion    Ankle plantarflexion    Ankle inversion    Ankle eversion     (Blank rows = not tested)  LOWER EXTREMITY STRENGTH:    MMT EVAL   Hip flexion    Hip extension    Hip abduction    Hip adduction    Hip internal rotation    Hip external rotation    Knee flexion    Knee extension    Ankle dorsiflexion    Ankle plantarflexion    Ankle inversion    Ankle eversion    Spine strength Objective strength testing was deferred secondary to discomfort with test positioning    (Blank rows = not tested)  GAIT: Distance walked: 100 feet Assistive device utilized: None Level of assistance: Complete Independence Comments: Slight forward flexion at the hips and increasing pain with prolonged periods of standing and walking  TODAY'S TREATMENT:  DATE:  06/13/2022 Lumbar extension AROM 10 for 3 seconds (limit range PRN to avoid radiculopathy) Shoulder blade pinches 10 for 5 seconds Bridging 2 sets of 10 5 seconds (limit range PRN to avoid radiculopathy) Figure 4 stretch 5X 20 seconds Hamstrings stretch 5X 20 seconds (other leg straight) Alternating hip hike 2 sets of 10 for 3 seconds  Alternating standing hip extensions 2 sets of 10 for 3 seconds (needs feedback and correction)  Functional Activities: Practical log roll; practical golfer's lift; review of posture with household chores (vacuum, dishes, hedge trimming)   06/11/2022 Lumbar extension AROM 10 for 3 seconds (limited range to avoid radiculopathy) Shoulder blade pinches 10 for 5 seconds Bridging 2 sets of 10 5 seconds (limited range to avoid radiculopathy) Figure 4 stretch 5X 20 seconds Hamstrings stretch 5X 20 seconds (other leg straight) Alternating hip hike 2 sets of 10 for 3 seconds (needed feedback and correction) Alternating standing hip extensions 2 sets of 10 for 3 seconds (needed feedback and correction)   06/04/2022 Lumbar extension AROM 10 for 3 seconds (limited range to avoid radiculopathy) Shoulder blade pinches 10 for 5 seconds Bridging 10X 5 seconds (limited range to avoid radiculopathy) Figure 4 stretch 5X 20 seconds Alternating hip hike 2 sets of 10 for 3 seconds (needed feedback and correction) Alternating standing hip extensions 2 sets of 10 for 3 seconds (needed feedback and correction)  Functional Activities: Log roll, walking prescription review and postural education (washing dishes and trimming bushes mechanics, reviewed spine anatomy and related to imaging/degenerative changes, disc pressures in various postures)   PATIENT EDUCATION:  Education details: Reviewed exam findings and home exercise program Person educated:  Patient Education method: Explanation, Demonstration, Tactile cues, Verbal cues, and Handouts Education comprehension: verbalized understanding, returned demonstration, verbal cues required, tactile cues required, and needs further education  HOME EXERCISE PROGRAM: [id number]  ASSESSMENT:  CLINICAL IMPRESSION: Overall, Kyung reports a decrease in the intensity and range of her peripheral symptoms.  Symptoms have been severe over the past week due to some poor body mechanics with household chores (vacuuming, hedge trimming, washing dishes) that involved flexion.  We reviewed practical body mechanics and her strengthening today. Weakness on the left side is noted with many of her strength activities.  Libbie is still on track to meet long-term goals established at evaluation by the date established at evaluation as long as she continues supervised PT, has good HEP compliance and she pays careful attention to her body mechanics.  OBJECTIVE IMPAIRMENTS: Abnormal gait, decreased activity tolerance, decreased endurance, decreased knowledge of condition, difficulty walking, decreased ROM, decreased strength, decreased safety awareness, impaired perceived functional ability, impaired flexibility, improper body mechanics, postural dysfunction, obesity, and pain.   ACTIVITY LIMITATIONS: lifting, bending, standing, bed mobility, and locomotion level  PARTICIPATION LIMITATIONS: interpersonal relationship and community activity  PERSONAL FACTORS: OA, skin cancer, HTN, previous Rt knee scope, atrial fibrillation, Lt TKA are also affecting patient's functional outcome.   REHAB POTENTIAL: Good  CLINICAL DECISION MAKING: Stable/uncomplicated  EVALUATION COMPLEXITY: Low   GOALS: Goals reviewed with patient? Yes  SHORT TERM GOALS: Target date: 06/13/2022   Improve radicular symptoms-free lumbar extension to at least 5 degrees Baseline: Radicular symptoms at < 5 degrees Goal status: Met 06/13/2022  2.   Avereigh will be independent with her day 1 HEP Baseline: Started 05/16/2022 Goal status: Met 06/11/2022  3.  Improve bilateral hamstrings flexibility to 45 degrees and hip ER to 40 degrees Baseline: 40 degrees hamstrings and 22/35 degrees hip ER Goal  status: Met 06/13/2022   LONG TERM GOALS: Target date: 07/11/2022  Improve FOTO to 65 in 10 visits Baseline: 53 Goal status: On Going 06/13/2022  2.  Monnica will report low back pain consistently < 3/10 on the Numeric Pain Rating Scale with no complaints of peripheral symptoms Baseline: Low back and left posterior thigh/calf 1-4/10 Goal status: On Going 06/13/2022  3.  Hisae will be able to perform ADLs around the house including bed mobility with correct spine/body mechanics Baseline: Education started 05/16/2022 Goal status: Partially Met 06/13/2022  4.  Katanya will be independent with her long-term HEP at [place] Baseline: Started 05/16/2022 Goal status: On Going 06/13/2022  PLAN:  PT FREQUENCY: 1-2x/week  PT DURATION: 4 weeks  PLANNED INTERVENTIONS: Therapeutic exercises, Therapeutic activity, Neuromuscular re-education, Gait training, Patient/Family education, Self Care, Joint mobilization, Dry Needling, Cryotherapy, Moist heat, Traction, and Manual therapy.  PLAN FOR NEXT SESSION: Practical body mechanics work.  Hip abductors, scapular retractors and lumbar extensors strength work progressions as appropriate.   Cherlyn Cushing, PT, MPT 06/13/2022, 12:56 PM

## 2022-06-18 ENCOUNTER — Encounter: Payer: Self-pay | Admitting: Rehabilitative and Restorative Service Providers"

## 2022-06-18 ENCOUNTER — Ambulatory Visit (INDEPENDENT_AMBULATORY_CARE_PROVIDER_SITE_OTHER): Payer: Medicare Other | Admitting: Rehabilitative and Restorative Service Providers"

## 2022-06-18 DIAGNOSIS — M5459 Other low back pain: Secondary | ICD-10-CM

## 2022-06-18 DIAGNOSIS — R262 Difficulty in walking, not elsewhere classified: Secondary | ICD-10-CM

## 2022-06-18 DIAGNOSIS — R293 Abnormal posture: Secondary | ICD-10-CM | POA: Diagnosis not present

## 2022-06-18 DIAGNOSIS — M6281 Muscle weakness (generalized): Secondary | ICD-10-CM

## 2022-06-18 NOTE — Therapy (Signed)
OUTPATIENT PHYSICAL THERAPY TREATMENT NOTE  Patient Name: Bridget [name] MRN: [id number] DOB:[birth year], 77 y.o., female Today's Date: 06/18/2022  END OF SESSION:  PT End of Session - 06/18/22 1256     Visit Number 8    Date for PT Re-Evaluation 07/11/22    Authorization Type Medicare    Authorization - Visit Number 8    Progress Note Due on Visit 17    PT Start Time 1255    PT Stop Time 1336    PT Time Calculation (min) 41 min    Activity Tolerance Patient tolerated treatment well;No increased pain    Behavior During Therapy WFL for tasks assessed/performed              Past Medical History:  Diagnosis Date   Arthritis    Cancer (HCC)    basal cell cancer removed on face   Hypertension    Seasonal allergies    Sleep apnea    Past Surgical History:  Procedure Laterality Date   BREAST EXCISIONAL BIOPSY Left    CHOLECYSTECTOMY  02/2010   KNEE SURGERY Right    Meniscal tear   OOPHORECTOMY  01/2003   RSO AT [facility]   TOTAL KNEE ARTHROPLASTY Left 10/18/2021   Procedure: LEFT TOTAL KNEE ARTHROPLASTY;  Surgeon: Kathryne Hitch, MD;  Location: [facility];  Service: Orthopedics;  Laterality: Left;   VAGINAL HYSTERECTOMY  01/2003   [facility] A&P REPAIR   WISDOM TOOTH EXTRACTION     Patient Active Problem List   Diagnosis Date Noted   Status post total left knee replacement 10/18/2021   Degenerative arthritis of left knee 11/24/2019   Hypercoagulable state due to paroxysmal atrial fibrillation (HCC) 01/26/2019   Greater trochanteric bursitis of right hip 09/23/2018   Lumbar radiculopathy 08/12/2018   Gluteal tendonitis of right buttock 07/29/2018   Atrial fibrillation (HCC) 07/21/2018   Arthritis of left acromioclavicular joint 04/26/2018   Partial nontraumatic tear of left rotator cuff 04/26/2018   Hypertension    Seasonal allergies     PCP: Mila Palmer, MD  REFERRING PROVIDER: Rodolph Bong, MD  REFERRING DIAG:  Diagnosis  M54.16 (ICD-10-CM) - Lumbar  radiculopathy  G57.02 (ICD-10-CM) - Piriformis syndrome of left side    Rationale for Evaluation and Treatment: Rehabilitation  THERAPY DIAG:  Abnormal posture  Difficulty in walking, not elsewhere classified  Muscle weakness (generalized)  Other low back pain  ONSET DATE: Early April 2024  SUBJECTIVE:  SUBJECTIVE STATEMENT: Felicitas notes improved low back and left leg pain as distal as the knee (was calf) due to avoiding flexed postures.  She reports "A-B" HEP compliance.  PERTINENT HISTORY:  OA, skin cancer, HTN, previous Rt knee scope, atrial fibrillation, Lt TKA  PAIN:  Are you having pain? Yes: NPRS scale: 0-4/10 Pain location: Low back and posterior thigh (was mid to middle left calf) Pain description: Burning in leg and low back ache Aggravating factors: Standing, prolonged postures Relieving factors: Walking, sleeping  PRECAUTIONS: Back  WEIGHT BEARING RESTRICTIONS: No  FALLS:  Has patient fallen in last 6 months? No  LIVING ENVIRONMENT: Lives with: lives with their family and lives with their spouse Lives in: House/apartment Stairs:  Can do reciprocally with hand rail Has following equipment at home: Single point cane, Environmental consultant - 2 wheeled, Tour manager, and Grab bars  OCCUPATION: Retired  PLOF: Independent  PATIENT GOALS: Gardening and go to her grandchildren's  events  NEXT MD VISIT: Later in month  OBJECTIVE:   DIAGNOSTIC FINDINGS:  No recent fracture is seen. There is first-degree anterolisthesis at the L4-L5 level. Degenerative changes are noted with bony spurs and facet hypertrophy. There is marked disc space narrowing at the L5-S1 level.  PATIENT SURVEYS:  06/13/2022 FOTO 58  Eval FOTO 53 (Goal 65 in 10 visits)  SCREENING FOR RED FLAGS: Bowel or bladder  incontinence: No Spinal tumors: No Cauda equina syndrome: No Compression fracture: No Abdominal aneurysm: No  COGNITION: Overall cognitive status: Within functional limits for tasks assessed     SENSATION: Occasional left posterior thigh burning, more frequent left calf burning  MUSCLE LENGTH: 06/13/2022 Hamstrings: Right 50 deg; Left 50 deg  Eval Hamstrings: Right 40 deg; Left 40 deg  POSTURE: rounded shoulders, forward head, and decreased lumbar lordosis   AROM eval 06/13/2022  Flexion    Extension 0 (less than 5 degrees) 5 degrees  Right lateral flexion    Left lateral flexion    Right rotation    Left rotation     (Blank rows = not tested)  LOWER EXTREMITY ROM:     Passive  Left/Right 05/16/2022 Left/Right 06/13/2022  Hip flexion 90/90 95/95  Hip extension    Hip abduction    Hip adduction    Hip internal rotation 18/-3 23/3  Hip external rotation 22/35 25/40  Knee flexion    Knee extension    Ankle dorsiflexion    Ankle plantarflexion    Ankle inversion    Ankle eversion     (Blank rows = not tested)  LOWER EXTREMITY STRENGTH:    MMT EVAL   Hip flexion    Hip extension    Hip abduction    Hip adduction    Hip internal rotation    Hip external rotation    Knee flexion    Knee extension    Ankle dorsiflexion    Ankle plantarflexion    Ankle inversion    Ankle eversion    Spine strength Objective strength testing was deferred secondary to discomfort with test positioning    (Blank rows = not tested)  GAIT: Distance walked: 100 feet Assistive device utilized: None Level of assistance: Complete Independence Comments: Slight forward flexion at the hips and increasing pain with prolonged periods of standing and walking  TODAY'S TREATMENT:  DATE:  06/18/2022 Lumbar extension AROM 10 for 3 seconds (limit range PRN to avoid  radiculopathy) Shoulder blade pinches 10 for 5 seconds Bridging 2 sets of 10 5 seconds (limit range PRN to avoid radiculopathy) Figure 4 stretch 5X 20 seconds Hamstrings stretch 5X 20 seconds (other leg straight) Alternating hip hike 2 sets of 10 for 3 seconds  Alternating standing hip extensions 2 sets of 10 for 3 seconds (needs feedback and correction)  Functional Activities: Practical change of position when watching her grandchild's track meet; log roll; practical golfer's and diagonal squat lift; review of posture with household chores (vacuum, dishes, hedge trimming)   06/13/2022 Lumbar extension AROM 10 for 3 seconds (limit range PRN to avoid radiculopathy) Shoulder blade pinches 10 for 5 seconds Bridging 2 sets of 10 5 seconds (limit range PRN to avoid radiculopathy) Figure 4 stretch 5X 20 seconds Hamstrings stretch 5X 20 seconds (other leg straight) Alternating hip hike 2 sets of 10 for 3 seconds  Alternating standing hip extensions 2 sets of 10 for 3 seconds (needs feedback and correction)  Functional Activities: Practical log roll; practical golfer's lift; review of posture with household chores (vacuum, dishes, hedge trimming)   06/11/2022 Lumbar extension AROM 10 for 3 seconds (limited range to avoid radiculopathy) Shoulder blade pinches 10 for 5 seconds Bridging 2 sets of 10 5 seconds (limited range to avoid radiculopathy) Figure 4 stretch 5X 20 seconds Hamstrings stretch 5X 20 seconds (other leg straight) Alternating hip hike 2 sets of 10 for 3 seconds (needed feedback and correction) Alternating standing hip extensions 2 sets of 10 for 3 seconds (needed feedback and correction)   PATIENT EDUCATION:  Education details: Reviewed exam findings and home exercise program Person educated: Patient Education method: Explanation, Demonstration, Tactile cues, Verbal cues, and Handouts Education comprehension: verbalized understanding, returned demonstration, verbal cues  required, tactile cues required, and needs further education  HOME EXERCISE PROGRAM: [id number]  ASSESSMENT:  CLINICAL IMPRESSION: Lahela's peripheral symptoms continue to decrease in intensity and range.  Symptoms have been better over the past few days due to more careful attention to body mechanics with household chores (vacuuming, hedge trimming, washing dishes) that previously involved flexion.  We spent a good chunk of time reviewing practical body mechanics today. Weakness on the left side is still noted with many of her strength activities.  Cartier remains on track to meet long-term goals established at evaluation by the date established at evaluation as long as she continues supervised PT, has good HEP compliance and she pays careful attention to her body mechanics.  OBJECTIVE IMPAIRMENTS: Abnormal gait, decreased activity tolerance, decreased endurance, decreased knowledge of condition, difficulty walking, decreased ROM, decreased strength, decreased safety awareness, impaired perceived functional ability, impaired flexibility, improper body mechanics, postural dysfunction, obesity, and pain.   ACTIVITY LIMITATIONS: lifting, bending, standing, bed mobility, and locomotion level  PARTICIPATION LIMITATIONS: interpersonal relationship and community activity  PERSONAL FACTORS: OA, skin cancer, HTN, previous Rt knee scope, atrial fibrillation, Lt TKA are also affecting patient's functional outcome.   REHAB POTENTIAL: Good  CLINICAL DECISION MAKING: Stable/uncomplicated  EVALUATION COMPLEXITY: Low   GOALS: Goals reviewed with patient? Yes  SHORT TERM GOALS: Target date: 06/13/2022   Improve radicular symptoms-free lumbar extension to at least 5 degrees Baseline: Radicular symptoms at < 5 degrees Goal status: Met 06/13/2022  2.  Khania will be independent with her day 1 HEP Baseline: Started 05/16/2022 Goal status: Met 06/11/2022  3.  Improve bilateral hamstrings flexibility to 45  degrees and  hip ER to 40 degrees Baseline: 40 degrees hamstrings and 22/35 degrees hip ER Goal status: Met 06/13/2022   LONG TERM GOALS: Target date: 07/11/2022  Improve FOTO to 65 in 10 visits Baseline: 53 Goal status: On Going 06/13/2022  2.  Aleene will report low back pain consistently < 3/10 on the Numeric Pain Rating Scale with no complaints of peripheral symptoms Baseline: Low back and left posterior thigh/calf 1-4/10 Goal status: On Going 06/18/2022  3.  Raziel will be able to perform ADLs around the house including bed mobility with correct spine/body mechanics Baseline: Education started 05/16/2022 Goal status: Partially Met 06/18/2022  4.  Aalaiyah will be independent with her long-term HEP at [place] Baseline: Started 05/16/2022 Goal status: On Going 06/18/2022  PLAN:  PT FREQUENCY: 1-2x/week  PT DURATION: 4 weeks  PLANNED INTERVENTIONS: Therapeutic exercises, Therapeutic activity, Neuromuscular re-education, Gait training, Patient/Family education, Self Care, Joint mobilization, Dry Needling, Cryotherapy, Moist heat, Traction, and Manual therapy.  PLAN FOR NEXT SESSION: Practical body mechanics work.  Progress hip abductors, scapular retractors and lumbar extensors strength as appropriate.   Cherlyn Cushing, PT, MPT 06/18/2022, 1:39 PM

## 2022-06-20 ENCOUNTER — Encounter: Payer: Medicare Other | Admitting: Rehabilitative and Restorative Service Providers"

## 2022-06-23 ENCOUNTER — Encounter: Payer: Self-pay | Admitting: Rehabilitative and Restorative Service Providers"

## 2022-06-23 ENCOUNTER — Ambulatory Visit (INDEPENDENT_AMBULATORY_CARE_PROVIDER_SITE_OTHER): Payer: Medicare Other | Admitting: Rehabilitative and Restorative Service Providers"

## 2022-06-23 DIAGNOSIS — M5459 Other low back pain: Secondary | ICD-10-CM | POA: Diagnosis not present

## 2022-06-23 DIAGNOSIS — R262 Difficulty in walking, not elsewhere classified: Secondary | ICD-10-CM

## 2022-06-23 DIAGNOSIS — R293 Abnormal posture: Secondary | ICD-10-CM

## 2022-06-23 DIAGNOSIS — M6281 Muscle weakness (generalized): Secondary | ICD-10-CM

## 2022-06-23 NOTE — Therapy (Signed)
OUTPATIENT PHYSICAL THERAPY TREATMENT NOTE  Patient Name: Bridget [name] MRN: [id number] DOB:[birth year], 77 y.o., female Today's Date: 06/23/2022  END OF SESSION:  PT End of Session - 06/23/22 1259     Visit Number 9    Date for PT Re-Evaluation 07/11/22    Authorization Type Medicare    Authorization - Visit Number 9    Progress Note Due on Visit 17    PT Start Time 1257    PT Stop Time 1337    PT Time Calculation (min) 40 min    Activity Tolerance Patient tolerated treatment well;No increased pain    Behavior During Therapy WFL for tasks assessed/performed               Past Medical History:  Diagnosis Date   Arthritis    Cancer (HCC)    basal cell cancer removed on face   Hypertension    Seasonal allergies    Sleep apnea    Past Surgical History:  Procedure Laterality Date   BREAST EXCISIONAL BIOPSY Left    CHOLECYSTECTOMY  02/2010   KNEE SURGERY Right    Meniscal tear   OOPHORECTOMY  01/2003   RSO AT [facility]   TOTAL KNEE ARTHROPLASTY Left 10/18/2021   Procedure: LEFT TOTAL KNEE ARTHROPLASTY;  Surgeon: Kathryne Hitch, MD;  Location: [facility];  Service: Orthopedics;  Laterality: Left;   VAGINAL HYSTERECTOMY  01/2003   [facility],RSO A&P REPAIR   WISDOM TOOTH EXTRACTION     Patient Active Problem List   Diagnosis Date Noted   Status post total left knee replacement 10/18/2021   Degenerative arthritis of left knee 11/24/2019   Hypercoagulable state due to paroxysmal atrial fibrillation (HCC) 01/26/2019   Greater trochanteric bursitis of right hip 09/23/2018   Lumbar radiculopathy 08/12/2018   Gluteal tendonitis of right buttock 07/29/2018   Atrial fibrillation (HCC) 07/21/2018   Arthritis of left acromioclavicular joint 04/26/2018   Partial nontraumatic tear of left rotator cuff 04/26/2018   Hypertension    Seasonal allergies     PCP: Mila Palmer, MD  REFERRING PROVIDER: Rodolph Bong, MD  REFERRING DIAG:  Diagnosis  M54.16 (ICD-10-CM) - Lumbar  radiculopathy  G57.02 (ICD-10-CM) - Piriformis syndrome of left side    Rationale for Evaluation and Treatment: Rehabilitation  THERAPY DIAG:  Abnormal posture  Difficulty in walking, not elsewhere classified  Muscle weakness (generalized)  Other low back pain  ONSET DATE: Early April 2024  SUBJECTIVE:  SUBJECTIVE STATEMENT: Malesha notes she "took my advice" and took short, frequent breaks to rest and stretch and was able to "vacuum (her) whole house without pain."  Symptoms were also not as distal.    PERTINENT HISTORY:  OA, skin cancer, HTN, previous Rt knee scope, atrial fibrillation, Lt TKA  PAIN:  Are you having pain? Yes: NPRS scale: 0-2/10 Pain location: Low back and posterior thigh (was mid to middle left calf) Pain description: Burning in leg and low back ache Aggravating factors: Standing, prolonged postures Relieving factors: Walking, sleeping  PRECAUTIONS: Back  WEIGHT BEARING RESTRICTIONS: No  FALLS:  Has patient fallen in last 6 months? No  LIVING ENVIRONMENT: Lives with: lives with their family and lives with their spouse Lives in: House/apartment Stairs:  Can do reciprocally with hand rail Has following equipment at home: Single point cane, Environmental consultant - 2 wheeled, Tour manager, and Grab bars  OCCUPATION: Retired  PLOF: Independent  PATIENT GOALS: Gardening and go to her grandchildren's  events  NEXT MD VISIT: Later in month  OBJECTIVE:   DIAGNOSTIC FINDINGS:  No recent fracture is seen. There is first-degree anterolisthesis at the L4-L5 level. Degenerative changes are noted with bony spurs and facet hypertrophy. There is marked disc space narrowing at the L5-S1 level.  PATIENT SURVEYS:  06/13/2022 FOTO 58  Eval FOTO 53 (Goal 65 in 10 visits)  SCREENING FOR RED  FLAGS: Bowel or bladder incontinence: No Spinal tumors: No Cauda equina syndrome: No Compression fracture: No Abdominal aneurysm: No  COGNITION: Overall cognitive status: Within functional limits for tasks assessed     SENSATION: Occasional left posterior thigh burning, more frequent left calf burning  MUSCLE LENGTH: 06/13/2022 Hamstrings: Right 50 deg; Left 50 deg  Eval Hamstrings: Right 40 deg; Left 40 deg  POSTURE: rounded shoulders, forward head, and decreased lumbar lordosis   AROM eval 06/13/2022  Flexion    Extension 0 (less than 5 degrees) 5 degrees  Right lateral flexion    Left lateral flexion    Right rotation    Left rotation     (Blank rows = not tested)  LOWER EXTREMITY ROM:     Passive  Left/Right 05/16/2022 Left/Right 06/13/2022  Hip flexion 90/90 95/95  Hip extension    Hip abduction    Hip adduction    Hip internal rotation 18/-3 23/3  Hip external rotation 22/35 25/40  Knee flexion    Knee extension    Ankle dorsiflexion    Ankle plantarflexion    Ankle inversion    Ankle eversion     (Blank rows = not tested)  LOWER EXTREMITY STRENGTH:    MMT EVAL   Hip flexion    Hip extension    Hip abduction    Hip adduction    Hip internal rotation    Hip external rotation    Knee flexion    Knee extension    Ankle dorsiflexion    Ankle plantarflexion    Ankle inversion    Ankle eversion    Spine strength Objective strength testing was deferred secondary to discomfort with test positioning    (Blank rows = not tested)  GAIT: Distance walked: 100 feet Assistive device utilized: None Level of assistance: Complete Independence Comments: Slight forward flexion at the hips and increasing pain with prolonged periods of standing and walking  TODAY'S TREATMENT:  DATE:  06/23/2022 Lumbar extension AROM 5 for 3 seconds (limit range  PRN to avoid radiculopathy) Shoulder blade pinches 5 for 5 seconds Bridging 2 sets of 10 5 seconds (limit range PRN to avoid radiculopathy) Figure 4 stretch 5 x 20 seconds Hamstrings stretch 5 x 20 seconds (other leg straight) Supine IT Band stretch left 5 x 20 seconds Alternating hip hike 2 sets of 10 for 3 seconds  Alternating standing hip extensions 2 sets of 10 for 3 seconds (needs feedback and correction)   06/18/2022 Lumbar extension AROM 10 for 3 seconds (limit range PRN to avoid radiculopathy) Shoulder blade pinches 10 for 5 seconds Bridging 2 sets of 10 5 seconds (limit range PRN to avoid radiculopathy) Figure 4 stretch 5X 20 seconds Hamstrings stretch 5X 20 seconds (other leg straight) Alternating hip hike 2 sets of 10 for 3 seconds  Alternating standing hip extensions 2 sets of 10 for 3 seconds (needs feedback and correction)  Functional Activities: Practical change of position when watching her grandchild's track meet; log roll; practical golfer's and diagonal squat lift; review of posture with household chores (vacuum, dishes, hedge trimming)   06/13/2022 Lumbar extension AROM 10 for 3 seconds (limit range PRN to avoid radiculopathy) Shoulder blade pinches 10 for 5 seconds Bridging 2 sets of 10 5 seconds (limit range PRN to avoid radiculopathy) Figure 4 stretch 5X 20 seconds Hamstrings stretch 5X 20 seconds (other leg straight) Alternating hip hike 2 sets of 10 for 3 seconds  Alternating standing hip extensions 2 sets of 10 for 3 seconds (needs feedback and correction)  Functional Activities: Practical log roll; practical golfer's lift; review of posture with household chores (vacuum, dishes, hedge trimming)   PATIENT EDUCATION:  Education details: Reviewed exam findings and home exercise program Person educated: Patient Education method: Explanation, Demonstration, Tactile cues, Verbal cues, and Handouts Education comprehension: verbalized understanding, returned  demonstration, verbal cues required, tactile cues required, and needs further education  HOME EXERCISE PROGRAM: [id number]  ASSESSMENT:  CLINICAL IMPRESSION: Arien notes that symptoms continue to improve due to careful attention to body mechanics with household chores (vacuuming, hedge trimming, washing dishes) and recreational activities (watching grand kids' sporting events).  We added an IT Band stretch to address lateral knee tightness possibly more related to her TKA than her spine.  Dalyla remains on track to meet long-term goals established at evaluation by the date established at evaluation as long as she continues supervised PT, has good HEP compliance and she continues to pay careful attention to her body mechanics.  OBJECTIVE IMPAIRMENTS: Abnormal gait, decreased activity tolerance, decreased endurance, decreased knowledge of condition, difficulty walking, decreased ROM, decreased strength, decreased safety awareness, impaired perceived functional ability, impaired flexibility, improper body mechanics, postural dysfunction, obesity, and pain.   ACTIVITY LIMITATIONS: lifting, bending, standing, bed mobility, and locomotion level  PARTICIPATION LIMITATIONS: interpersonal relationship and community activity  PERSONAL FACTORS: OA, skin cancer, HTN, previous Rt knee scope, atrial fibrillation, Lt TKA are also affecting patient's functional outcome.   REHAB POTENTIAL: Good  CLINICAL DECISION MAKING: Stable/uncomplicated  EVALUATION COMPLEXITY: Low   GOALS: Goals reviewed with patient? Yes  SHORT TERM GOALS: Target date: 06/13/2022   Improve radicular symptoms-free lumbar extension to at least 5 degrees Baseline: Radicular symptoms at < 5 degrees Goal status: Met 06/13/2022  2.  Kortnie will be independent with her day 1 HEP Baseline: Started 05/16/2022 Goal status: Met 06/11/2022  3.  Improve bilateral hamstrings flexibility to 45 degrees and hip ER to 40 degrees  Baseline: 40  degrees hamstrings and 22/35 degrees hip ER Goal status: Met 06/13/2022   LONG TERM GOALS: Target date: 07/11/2022  Improve FOTO to 65 in 10 visits Baseline: 53 Goal status: On Going 06/13/2022  2.  Alandra will report low back pain consistently < 3/10 on the Numeric Pain Rating Scale with no complaints of peripheral symptoms Baseline: Low back and left posterior thigh/calf 1-4/10 Goal status: Partially Met 06/23/2022  3.  Bristyn will be able to perform ADLs around the house including bed mobility with correct spine/body mechanics Baseline: Education started 05/16/2022 Goal status: Met 06/23/2022  4.  Lettie will be independent with her long-term HEP at [place] Baseline: Started 05/16/2022 Goal status: On Going 06/23/2022  PLAN:  PT FREQUENCY: 1-2x/week  PT DURATION: 4 weeks  PLANNED INTERVENTIONS: Therapeutic exercises, Therapeutic activity, Neuromuscular re-education, Gait training, Patient/Family education, Self Care, Joint mobilization, Dry Needling, Cryotherapy, Moist heat, Traction, and Manual therapy.  PLAN FOR NEXT SESSION: Practical body mechanics work as needed.  Progress hip abductors, scapular retractors and lumbar extensors strength as appropriate.  How has compliance with strength activities been?   Cherlyn Cushing, PT, MPT 06/23/2022, 1:41 PM

## 2022-06-25 ENCOUNTER — Encounter: Payer: Medicare Other | Admitting: Physical Therapy

## 2022-06-27 ENCOUNTER — Encounter: Payer: Self-pay | Admitting: Cardiology

## 2022-06-27 ENCOUNTER — Ambulatory Visit: Payer: Medicare Other | Attending: Cardiology | Admitting: Cardiology

## 2022-06-27 VITALS — BP 122/80 | HR 74 | Ht 64.0 in | Wt 224.6 lb

## 2022-06-27 DIAGNOSIS — G4733 Obstructive sleep apnea (adult) (pediatric): Secondary | ICD-10-CM | POA: Insufficient documentation

## 2022-06-27 DIAGNOSIS — I480 Paroxysmal atrial fibrillation: Secondary | ICD-10-CM | POA: Insufficient documentation

## 2022-06-27 DIAGNOSIS — R600 Localized edema: Secondary | ICD-10-CM | POA: Diagnosis not present

## 2022-06-27 DIAGNOSIS — I10 Essential (primary) hypertension: Secondary | ICD-10-CM | POA: Diagnosis not present

## 2022-06-27 NOTE — Patient Instructions (Signed)
Medication Instructions:  Your physician recommends that you continue on your current medications as directed. Please refer to the Current Medication list given to you today.  *If you need a refill on your cardiac medications before your next appointment, please call your pharmacy*   Lab Work: None.  If you have labs (blood work) drawn today and your tests are completely normal, you will receive your results only by: MyChart Message (if you have MyChart) OR A paper copy in the mail If you have any lab test that is abnormal or we need to change your treatment, we will call you to review the results.   Testing/Procedures: None.   Follow-Up: At [facility], you and your health needs are our priority.  As part of our continuing mission to provide you with exceptional heart care, we have created designated Provider Care Teams.  These Care Teams include your primary Cardiologist (physician) and Advanced Practice Providers (APPs -  Physician Assistants and Nurse Practitioners) who all work together to provide you with the care you need, when you need it.  We recommend signing up for the patient portal called "MyChart".  Sign up information is provided on this After Visit Summary.  MyChart is used to connect with patients for Virtual Visits (Telemedicine).  Patients are able to view lab/test results, encounter notes, upcoming appointments, etc.  Non-urgent messages can be sent to your provider as well.   To learn more about what you can do with MyChart, go to [email or web address].    Your next appointment:   1 year(s)  Provider:   Traci Turner, MD     

## 2022-06-27 NOTE — Progress Notes (Signed)
Date:  06/27/2022   ID:  Bridget [name], DOB June 17, 1945, MRN [id number]   PCP:  Bridget Palmer, MD  Sleep Medicine:  Bridget Magic, MD Electrophysiologist:  None   Chief Complaint:  OSA  History of Present Illness:    Bridget [name] is a 77 y.o. female with a hx of PAF.   She has had some excessive daytime sleepiness as well as snoring.  She underwent sleep study showing mild OSA with an AHI of 11.1/hr but severe during REM sleep with an AHi of 49/hr.  O2 sats were as low as 89%.  She underwent CPAP titration to 6 cm H2O. Unfortunately she had a reoccurrence of atrial fibrillation May 2024 after taking steroids for neuropathy.  She was at [facility] ER and spontaneously converted to NSR while there.  Seen back in afib clinic and was started on diltiazem 30mg  q6 hours PRN for breakthrough palpitations.   She is here today for followup and is doing well.  She denies any chest pain or pressure, SOB, DOE, PND, orthopnea, dizziness or syncope. She has chronic LE edema stable on diuretics. She has not had any further palpitations since her ER visit last month. She is compliant with her meds and is tolerating meds with no SE.    She is doing well with her PAP device and thinks that she has gotten used to it.  She tolerates the mask and feels the pressure is adequate.  Since going on PAP she feels rested in the am and has no significant daytime sleepiness.  She denies any significant mouth or nasal dryness or nasal congestion.  She does not think that he snores.      Prior CV studies:   The following studies were reviewed today:  None  Past Medical History:  Diagnosis Date   Arthritis    Cancer (HCC)    basal cell cancer removed on face   Hypertension    Seasonal allergies    Sleep apnea    Past Surgical History:  Procedure Laterality Date   BREAST EXCISIONAL BIOPSY Left    CHOLECYSTECTOMY  02/2010   KNEE SURGERY Right    Meniscal tear   OOPHORECTOMY  01/2003   RSO AT [facility]   TOTAL  KNEE ARTHROPLASTY Left 10/18/2021   Procedure: LEFT TOTAL KNEE ARTHROPLASTY;  Surgeon: Kathryne Hitch, MD;  Location: [facility]S;  Service: Orthopedics;  Laterality: Left;   VAGINAL HYSTERECTOMY  01/2003   [facility],RSO A&P REPAIR   WISDOM TOOTH EXTRACTION       No outpatient medications have been marked as taking for the 06/27/22 encounter (Office Visit) with Quintella Reichert, MD.     Allergies:   Codeine   Social History   Tobacco Use   Smoking status: Never   Smokeless tobacco: Never  Vaping Use   Vaping Use: Never used  Substance Use Topics   Alcohol use: Yes    Comment: SELDOM   Drug use: No     Family Hx: The patient's family history includes Breast cancer (age of onset: 65) in her sister; Cancer in her father; Cancer (age of onset: 31) in her brother; Diabetes in her father; Heart disease in her mother; Hypertension in her father.  ROS:   Please see the history of present illness.     All other systems reviewed and are negative.   Labs/Other Tests and Data Reviewed:    Recent Labs: 05/10/2022: B Natriuretic Peptide 224.9; Magnesium 2.1 05/18/2022: ALT 18;  BUN 15; Creatinine, Ser 0.72; Hemoglobin 13.0; Platelets 273; Potassium 4.0; Sodium 137   Recent Lipid Panel No results found for: "CHOL", "TRIG", "HDL", "CHOLHDL", "LDLCALC", "LDLDIRECT"  Wt Readings from Last 3 Encounters:  06/27/22 224 lb 9.6 oz (101.9 kg)  05/27/22 223 lb 12.8 oz (101.5 kg)  05/18/22 232 lb 12.9 oz (105.6 kg)     Objective:    Vital Signs:  BP 122/80   Pulse 74   Ht 5\' 4"  (1.626 m)   Wt 224 lb 9.6 oz (101.9 kg)   LMP 01/07/2003   SpO2 96%   BMI 38.55 kg/m    GEN: Well nourished, well developed in no acute distress HEENT: Normal NECK: No JVD; No carotid bruits LYMPHATICS: No lymphadenopathy CARDIAC:RRR, no murmurs, rubs, gallops RESPIRATORY:  Clear to auscultation without rales, wheezing or rhonchi  ABDOMEN: Soft, non-tender, non-distended MUSCULOSKELETAL:  trace edema; No  deformity  SKIN: Warm and dry NEUROLOGIC:  Alert and oriented x 3 PSYCHIATRIC:  Normal affect    ASSESSMENT & PLAN:    OSA - The patient is tolerating PAP therapy well without any problems. The PAP download performed by his DME was personally reviewed and interpreted by me today and showed an AHI of 4.1 /hr on 6 cm H2O with 100% compliance in using more than 4 hours nightly.  The patient has been using and benefiting from PAP use and will continue to benefit from therapy.   HTN -BP is adequately controlled on exam today -Continue prescription drug management with benazepril 40 mg daily, Cardizem CD 240 mg daily with as needed refills  Chronic LE edema -She has chronic lower remedy edema which is stable on diuretics -Continue drug management with Lasix 20 mg daily with as needed refills -I have personally reviewed and interpreted outside labs performed by patient's PCP which showed serum creatinine 0.84 and potassium 4 on 05/19/2022  PAF -She remains in normal his rhythm and denies any palpitations or bleeding problems on DOAC -I have personally reviewed and interpreted outside labs performed by patient's PCP which showed hemoglobin 13 on 05/18/2022 -Continue prescription drug management with Cardizem CD 240 mg daily and Eliquis 5 mg twice daily with as needed refills  Medication Adjustments/Labs and Tests Ordered: Current medicines are reviewed at length with the patient today.  Concerns regarding medicines are outlined above.  Tests Ordered: No orders of the defined types were placed in this encounter.   Medication Changes: No orders of the defined types were placed in this encounter.    Disposition:  Follow up in 1 year(s)  Signed, Bridget Magic, MD  06/27/2022 9:59 AM    [facility]

## 2022-07-01 DIAGNOSIS — I519 Heart disease, unspecified: Secondary | ICD-10-CM | POA: Diagnosis not present

## 2022-07-01 DIAGNOSIS — E099 Drug or chemical induced diabetes mellitus without complications: Secondary | ICD-10-CM | POA: Diagnosis not present

## 2022-07-01 DIAGNOSIS — I4891 Unspecified atrial fibrillation: Secondary | ICD-10-CM | POA: Diagnosis not present

## 2022-07-03 ENCOUNTER — Ambulatory Visit (INDEPENDENT_AMBULATORY_CARE_PROVIDER_SITE_OTHER): Payer: Medicare Other | Admitting: Rehabilitative and Restorative Service Providers"

## 2022-07-03 ENCOUNTER — Encounter: Payer: Self-pay | Admitting: Rehabilitative and Restorative Service Providers"

## 2022-07-03 DIAGNOSIS — M6281 Muscle weakness (generalized): Secondary | ICD-10-CM

## 2022-07-03 DIAGNOSIS — R293 Abnormal posture: Secondary | ICD-10-CM | POA: Diagnosis not present

## 2022-07-03 DIAGNOSIS — M5459 Other low back pain: Secondary | ICD-10-CM | POA: Diagnosis not present

## 2022-07-03 DIAGNOSIS — R262 Difficulty in walking, not elsewhere classified: Secondary | ICD-10-CM

## 2022-07-03 NOTE — Therapy (Signed)
OUTPATIENT PHYSICAL THERAPY TREATMENT NOTE  Patient Name: Bridget [name] MRN: [id number] DOB:September 06, 1945, 77 y.o., female Today's Date: 07/03/2022  END OF SESSION:  PT End of Session - 07/03/22 1626     Visit Number 10    Date for PT Re-Evaluation 07/11/22    Authorization Type Medicare    Authorization - Visit Number 10    Progress Note Due on Visit 17    PT Start Time 1430    PT Stop Time 1515    PT Time Calculation (min) 45 min    Activity Tolerance Patient tolerated treatment well;No increased pain    Behavior During Therapy WFL for tasks assessed/performed              Past Medical History:  Diagnosis Date   Arthritis    Cancer (HCC)    basal cell cancer removed on face   Hypertension    Seasonal allergies    Sleep apnea    Past Surgical History:  Procedure Laterality Date   BREAST EXCISIONAL BIOPSY Left    CHOLECYSTECTOMY  02/2010   KNEE SURGERY Right    Meniscal tear   OOPHORECTOMY  01/2003   RSO AT [facility]   TOTAL KNEE ARTHROPLASTY Left 10/18/2021   Procedure: LEFT TOTAL KNEE ARTHROPLASTY;  Surgeon: Kathryne Hitch, MD;  Location: [facility];  Service: Orthopedics;  Laterality: Left;   VAGINAL HYSTERECTOMY  01/2003   TVH,RSO A&P REPAIR   WISDOM TOOTH EXTRACTION     Patient Active Problem List   Diagnosis Date Noted   Status post total left knee replacement 10/18/2021   Degenerative arthritis of left knee 11/24/2019   Hypercoagulable state due to paroxysmal atrial fibrillation (HCC) 01/26/2019   Greater trochanteric bursitis of right hip 09/23/2018   Lumbar radiculopathy 08/12/2018   Gluteal tendonitis of right buttock 07/29/2018   Atrial fibrillation (HCC) 07/21/2018   Arthritis of left acromioclavicular joint 04/26/2018   Partial nontraumatic tear of left rotator cuff 04/26/2018   Hypertension    Seasonal allergies     PCP: Mila Palmer, MD  REFERRING PROVIDER: Rodolph Bong, MD  REFERRING DIAG:  Diagnosis  M54.16 (ICD-10-CM) - Lumbar  radiculopathy  G57.02 (ICD-10-CM) - Piriformis syndrome of left side    Rationale for Evaluation and Treatment: Rehabilitation  THERAPY DIAG:  Abnormal posture  Difficulty in walking, not elsewhere classified  Muscle weakness (generalized)  Other low back pain  ONSET DATE: Early April 2024  SUBJECTIVE:  SUBJECTIVE STATEMENT: Mileah notes symptoms just distal to the knee only 1 time this week.  They appeared after prolonged sitting with paying bills.  Otherwise, symptoms have been more proximal over the past 2 weeks.  PERTINENT HISTORY:  OA, skin cancer, HTN, previous Rt knee scope, atrial fibrillation, Lt TKA  PAIN:  Are you having pain? Yes: NPRS scale: 0-3/10 Pain location: Low back and posterior thigh (was mid to middle left calf) Pain description: Burning in leg and low back ache Aggravating factors: Standing, prolonged postures Relieving factors: Walking, sleeping  PRECAUTIONS: Back  WEIGHT BEARING RESTRICTIONS: No  FALLS:  Has patient fallen in last 6 months? No  LIVING ENVIRONMENT: Lives with: lives with their family and lives with their spouse Lives in: House/apartment Stairs:  Can do reciprocally with hand rail Has following equipment at home: Single point cane, Environmental consultant - 2 wheeled, Tour manager, and Grab bars  OCCUPATION: Retired  PLOF: Independent  PATIENT GOALS: Gardening and go to her grandchildren's  events  NEXT MD VISIT: Later in month  OBJECTIVE:   DIAGNOSTIC FINDINGS:  No recent fracture is seen. There is first-degree anterolisthesis at the L4-L5 level. Degenerative changes are noted with bony spurs and facet hypertrophy. There is marked disc space narrowing at the L5-S1 level.  PATIENT SURVEYS:  06/13/2022 FOTO 58  Eval FOTO 53 (Goal 65 in 10  visits)  SCREENING FOR RED FLAGS: Bowel or bladder incontinence: No Spinal tumors: No Cauda equina syndrome: No Compression fracture: No Abdominal aneurysm: No  COGNITION: Overall cognitive status: Within functional limits for tasks assessed     SENSATION: Occasional left posterior thigh burning, more frequent left calf burning  MUSCLE LENGTH: 06/13/2022 Hamstrings: Right 50 deg; Left 50 deg  Eval Hamstrings: Right 40 deg; Left 40 deg  POSTURE: rounded shoulders, forward head, and decreased lumbar lordosis   AROM eval 06/13/2022  Flexion    Extension 0 (less than 5 degrees) 5 degrees  Right lateral flexion    Left lateral flexion    Right rotation    Left rotation     (Blank rows = not tested)  LOWER EXTREMITY ROM:     Passive  Left/Right 05/16/2022 Left/Right 06/13/2022  Hip flexion 90/90 95/95  Hip extension    Hip abduction    Hip adduction    Hip internal rotation 18/-3 23/3  Hip external rotation 22/35 25/40  Knee flexion    Knee extension    Ankle dorsiflexion    Ankle plantarflexion    Ankle inversion    Ankle eversion     (Blank rows = not tested)  LOWER EXTREMITY STRENGTH:    MMT EVAL   Hip flexion    Hip extension    Hip abduction    Hip adduction    Hip internal rotation    Hip external rotation    Knee flexion    Knee extension    Ankle dorsiflexion    Ankle plantarflexion    Ankle inversion    Ankle eversion    Spine strength Objective strength testing was deferred secondary to discomfort with test positioning    (Blank rows = not tested)  GAIT: Distance walked: 100 feet Assistive device utilized: None Level of assistance: Complete Independence Comments: Slight forward flexion at the hips and increasing pain with prolonged periods of standing and walking  TODAY'S TREATMENT:  DATE:  07/03/2022 Lumbar extension AROM  10 for 3 seconds Shoulder blade pinches 10 for 5 seconds Bridging 2 sets of 10 5 seconds (limit range to avoid radiculopathy) Figure 4 stretch 5 x 20 seconds Hamstrings stretch 5 x 20 seconds (other leg straight) Supine IT Band stretch left 5 x 20 seconds Alternating hip hike 2 sets of 10 for 3 seconds  Alternating standing hip extensions 2 sets of 10 for 3 seconds (needs feedback and correction)  Discussion on the importance of avoiding prolonged sitting, maintaining proper body mechanics with activities around the house including bending and lifting and brief review of spine anatomy   06/23/2022 Lumbar extension AROM 5 for 3 seconds (limit range PRN to avoid radiculopathy) Shoulder blade pinches 5 for 5 seconds Bridging 2 sets of 10 5 seconds (limit range PRN to avoid radiculopathy) Figure 4 stretch 5 x 20 seconds Hamstrings stretch 5 x 20 seconds (other leg straight) Supine IT Band stretch left 5 x 20 seconds Alternating hip hike 2 sets of 10 for 3 seconds  Alternating standing hip extensions 2 sets of 10 for 3 seconds (needs feedback and correction)   06/18/2022 Lumbar extension AROM 10 for 3 seconds (limit range PRN to avoid radiculopathy) Shoulder blade pinches 10 for 5 seconds Bridging 2 sets of 10 5 seconds (limit range PRN to avoid radiculopathy) Figure 4 stretch 5X 20 seconds Hamstrings stretch 5X 20 seconds (other leg straight) Alternating hip hike 2 sets of 10 for 3 seconds  Alternating standing hip extensions 2 sets of 10 for 3 seconds (needs feedback and correction)  Functional Activities: Practical change of position when watching her grandchild's track meet; log roll; practical golfer's and diagonal squat lift; review of posture with household chores (vacuum, dishes, hedge trimming)    PATIENT EDUCATION:  Education details: Reviewed exam findings and home exercise program Person educated: Patient Education method: Explanation, Demonstration, Tactile cues, Verbal  cues, and Handouts Education comprehension: verbalized understanding, returned demonstration, verbal cues required, tactile cues required, and needs further education  HOME EXERCISE PROGRAM: [id number]  ASSESSMENT:  CLINICAL IMPRESSION: Braylon notes that overall, symptoms continue to improve.  Due to a slight lapse in body mechanics with prolonged sitting, she did have an exacerbation this week, although she was able to resolve it quickly with her exercises and postural correction.  She is doing better with household chores (vacuuming, hedge trimming, washing dishes) and recreational activities (watching grand kids' sporting events).  We discussed doing another reassessment next week to determine whether she is remaining on track and possibly ready for independent rehabilitation, versus needing to get authorization for a few more visits to make sure she is able to meet all long-term goals established at evaluation.  OBJECTIVE IMPAIRMENTS: Abnormal gait, decreased activity tolerance, decreased endurance, decreased knowledge of condition, difficulty walking, decreased ROM, decreased strength, decreased safety awareness, impaired perceived functional ability, impaired flexibility, improper body mechanics, postural dysfunction, obesity, and pain.   ACTIVITY LIMITATIONS: lifting, bending, standing, bed mobility, and locomotion level  PARTICIPATION LIMITATIONS: interpersonal relationship and community activity  PERSONAL FACTORS: OA, skin cancer, HTN, previous Rt knee scope, atrial fibrillation, Lt TKA are also affecting patient's functional outcome.   REHAB POTENTIAL: Good  CLINICAL DECISION MAKING: Stable/uncomplicated  EVALUATION COMPLEXITY: Low   GOALS: Goals reviewed with patient? Yes  SHORT TERM GOALS: Target date: 06/13/2022   Improve radicular symptoms-free lumbar extension to at least 5 degrees Baseline: Radicular symptoms at < 5 degrees Goal status: Met 06/13/2022  2.  Dois Davenport  will be  independent with her day 1 HEP Baseline: Started 05/16/2022 Goal status: Met 06/11/2022  3.  Improve bilateral hamstrings flexibility to 45 degrees and hip ER to 40 degrees Baseline: 40 degrees hamstrings and 22/35 degrees hip ER Goal status: Met 06/13/2022   LONG TERM GOALS: Target date: 07/11/2022  Improve FOTO to 65 in 10 visits Baseline: 53 Goal status: On Going 06/13/2022  2.  Majorie will report low back pain consistently < 3/10 on the Numeric Pain Rating Scale with no complaints of peripheral symptoms Baseline: Low back and left posterior thigh/calf 1-4/10 Goal status: Partially Met 07/03/2022  3.  Tristian will be able to perform ADLs around the house including bed mobility with correct spine/body mechanics Baseline: Education started 05/16/2022 Goal status: Met 06/23/2022  4.  Mareesa will be independent with her long-term HEP at [place] Baseline: Started 05/16/2022 Goal status: On Going 07/03/2022  PLAN:  PT FREQUENCY: 1-2x/week  PT DURATION: 4 weeks  PLANNED INTERVENTIONS: Therapeutic exercises, Therapeutic activity, Neuromuscular re-education, Gait training, Patient/Family education, Self Care, Joint mobilization, Dry Needling, Cryotherapy, Moist heat, Traction, and Manual therapy.  PLAN FOR NEXT SESSION: Practical body mechanics work as needed.  Progress hip abductors, scapular retractors and lumbar extensors strength as appropriate.  Progress note.  DC or reauthorization?   Cherlyn Cushing, PT, MPT 07/03/2022, 4:31 PM

## 2022-07-08 ENCOUNTER — Encounter: Payer: Self-pay | Admitting: Rehabilitative and Restorative Service Providers"

## 2022-07-08 ENCOUNTER — Ambulatory Visit (INDEPENDENT_AMBULATORY_CARE_PROVIDER_SITE_OTHER): Payer: Medicare Other | Admitting: Rehabilitative and Restorative Service Providers"

## 2022-07-08 DIAGNOSIS — M6281 Muscle weakness (generalized): Secondary | ICD-10-CM

## 2022-07-08 DIAGNOSIS — R293 Abnormal posture: Secondary | ICD-10-CM | POA: Diagnosis not present

## 2022-07-08 DIAGNOSIS — R262 Difficulty in walking, not elsewhere classified: Secondary | ICD-10-CM | POA: Diagnosis not present

## 2022-07-08 DIAGNOSIS — M5459 Other low back pain: Secondary | ICD-10-CM | POA: Diagnosis not present

## 2022-07-08 NOTE — Therapy (Signed)
OUTPATIENT PHYSICAL THERAPY TREATMENT/DISCHARGE NOTE  Patient Name: TIANI BENVENUTI MRN: [id number] DOB:1945-09-22, 77 y.o., female Today's Date: 07/08/2022  END OF SESSION:  PT End of Session - 07/08/22 1108     Visit Number 11    Date for PT Re-Evaluation 07/11/22    Authorization Type Medicare    Authorization - Visit Number 11    Progress Note Due on Visit 21    PT Start Time 1102    PT Stop Time 1145    PT Time Calculation (min) 43 min    Activity Tolerance Patient tolerated treatment well;No increased pain    Behavior During Therapy WFL for tasks assessed/performed            PHYSICAL THERAPY DISCHARGE SUMMARY  Visits from Start of Care: 11  Current functional level related to goals / functional outcomes: See note   Remaining deficits: See note   Education / Equipment: Updated HEP   Patient agrees to discharge. Patient goals were met. Patient is being discharged due to being pleased with the current functional level.   Past Medical History:  Diagnosis Date   Arthritis    Cancer (HCC)    basal cell cancer removed on face   Hypertension    Seasonal allergies    Sleep apnea    Past Surgical History:  Procedure Laterality Date   BREAST EXCISIONAL BIOPSY Left    CHOLECYSTECTOMY  02/2010   KNEE SURGERY Right    Meniscal tear   OOPHORECTOMY  01/2003   RSO AT [facility]   TOTAL KNEE ARTHROPLASTY Left 10/18/2021   Procedure: LEFT TOTAL KNEE ARTHROPLASTY;  Surgeon: Kathryne Hitch, MD;  Location: [facility];  Service: Orthopedics;  Laterality: Left;   VAGINAL HYSTERECTOMY  01/2003   [facility],RSO A&P REPAIR   WISDOM TOOTH EXTRACTION     Patient Active Problem List   Diagnosis Date Noted   Status post total left knee replacement 10/18/2021   Degenerative arthritis of left knee 11/24/2019   Hypercoagulable state due to paroxysmal atrial fibrillation (HCC) 01/26/2019   Greater trochanteric bursitis of right hip 09/23/2018   Lumbar radiculopathy 08/12/2018    Gluteal tendonitis of right buttock 07/29/2018   Atrial fibrillation (HCC) 07/21/2018   Arthritis of left acromioclavicular joint 04/26/2018   Partial nontraumatic tear of left rotator cuff 04/26/2018   Hypertension    Seasonal allergies     PCP: Mila Palmer, MD  REFERRING PROVIDER: Rodolph Bong, MD  REFERRING DIAG:  Diagnosis  M54.16 (ICD-10-CM) - Lumbar radiculopathy  G57.02 (ICD-10-CM) - Piriformis syndrome of left side    Rationale for Evaluation and Treatment: Rehabilitation  THERAPY DIAG:  Abnormal posture  Difficulty in walking, not elsewhere classified  Muscle weakness (generalized)  Other low back pain  ONSET DATE: Early April 2024  SUBJECTIVE:  SUBJECTIVE STATEMENT: Crosley notes symptoms just distal to the knee only 1 time this week.  They appeared after prolonged sitting with paying bills.  Otherwise, symptoms have been more proximal over the past 2 weeks.  PERTINENT HISTORY:  OA, skin cancer, HTN, previous Rt knee scope, atrial fibrillation, Lt TKA  PAIN:  Are you having pain? Yes: NPRS scale: 0-3/10 Pain location: Low back and posterior thigh (was mid to middle left calf) Pain description: Burning in leg and low back ache Aggravating factors: Standing, prolonged postures Relieving factors: Walking, sleeping  PRECAUTIONS: Back  WEIGHT BEARING RESTRICTIONS: No  FALLS:  Has patient fallen in last 6 months? No  LIVING ENVIRONMENT: Lives with: lives with their family and lives with their spouse Lives in: House/apartment Stairs:  Can do reciprocally with hand rail Has following equipment at home: Single point cane, Environmental consultant - 2 wheeled, Tour manager, and Grab bars  OCCUPATION: Retired  PLOF: Independent  PATIENT GOALS: Gardening and go to her grandchildren's   events  NEXT MD VISIT: Later in month  OBJECTIVE:   DIAGNOSTIC FINDINGS:  No recent fracture is seen. There is first-degree anterolisthesis at the L4-L5 level. Degenerative changes are noted with bony spurs and facet hypertrophy. There is marked disc space narrowing at the L5-S1 level.  PATIENT SURVEYS:  07/08/2022 FOTO 68 (Goal Met)  06/13/2022 FOTO 58  Eval FOTO 53 (Goal 65 in 10 visits)  SCREENING FOR RED FLAGS: Bowel or bladder incontinence: No Spinal tumors: No Cauda equina syndrome: No Compression fracture: No Abdominal aneurysm: No  COGNITION: Overall cognitive status: Within functional limits for tasks assessed     SENSATION: Occasional left posterior thigh burning, more frequent left calf burning  MUSCLE LENGTH: 07/08/2022 Hamstrings: Right 50 deg; Left 50 deg  06/13/2022 Hamstrings: Right 50 deg; Left 50 deg  Eval Hamstrings: Right 40 deg; Left 40 deg  POSTURE: rounded shoulders, forward head, and decreased lumbar lordosis   AROM eval 06/13/2022 07/08/2022  Flexion     Extension 0 (less than 5 degrees) 5 degrees 5 degrees (between 5 and 10 degrees)  Right lateral flexion     Left lateral flexion     Right rotation     Left rotation      (Blank rows = not tested)  LOWER EXTREMITY ROM:     Passive  Left/Right 05/16/2022 Left/Right 06/13/2022 Left/Right 07/08/2022  Hip flexion 90/90 95/95 95/95   Hip extension     Hip abduction     Hip adduction     Hip internal rotation 18/-3 23/3 21/5  Hip external rotation 22/35 25/40 28/40   Knee flexion     Knee extension     Ankle dorsiflexion     Ankle plantarflexion     Ankle inversion     Ankle eversion      (Blank rows = not tested)  LOWER EXTREMITY STRENGTH:    MMT EVAL   Hip flexion    Hip extension    Hip abduction    Hip adduction    Hip internal rotation    Hip external rotation    Knee flexion    Knee extension    Ankle dorsiflexion    Ankle plantarflexion    Ankle inversion    Ankle eversion     Spine strength Objective strength testing was deferred secondary to discomfort with test positioning    (Blank rows = not tested)  GAIT: Distance walked: 100 feet Assistive device utilized: None Level of assistance: Complete Independence Comments: Slight forward  flexion at the hips and increasing pain with prolonged periods of standing and walking  TODAY'S TREATMENT:                                                                                                                              DATE:  07/08/2022 Lumbar extension AROM 10 for 3 seconds Shoulder blade pinches 10 for 5 seconds Bridging 2 sets of 10 5 seconds (limit range to avoid radiculopathy) Figure 4 stretch 5 x 20 seconds Hamstrings stretch 5 x 20 seconds (other leg straight) Supine IT Band stretch left 5 x 20 seconds Alternating hip hike 2 sets of 10 for 3 seconds   Reviewed progress notes findings, body mechanics for long-term maintenance and maintenance home exercise program   07/03/2022 Lumbar extension AROM 10 for 3 seconds Shoulder blade pinches 10 for 5 seconds Bridging 2 sets of 10 5 seconds (limit range to avoid radiculopathy) Figure 4 stretch 5 x 20 seconds Hamstrings stretch 5 x 20 seconds (other leg straight) Supine IT Band stretch left 5 x 20 seconds Alternating hip hike 2 sets of 10 for 3 seconds  Alternating standing hip extensions 2 sets of 10 for 3 seconds (needs feedback and correction)  Discussion on the importance of avoiding prolonged sitting, maintaining proper body mechanics with activities around the house including bending and lifting and brief review of spine anatomy   06/23/2022 Lumbar extension AROM 5 for 3 seconds (limit range PRN to avoid radiculopathy) Shoulder blade pinches 5 for 5 seconds Bridging 2 sets of 10 5 seconds (limit range PRN to avoid radiculopathy) Figure 4 stretch 5 x 20 seconds Hamstrings stretch 5 x 20 seconds (other leg straight) Supine IT Band stretch left 5 x 20  seconds Alternating hip hike 2 sets of 10 for 3 seconds  Alternating standing hip extensions 2 sets of 10 for 3 seconds (needs feedback and correction)   PATIENT EDUCATION:  Education details: Reviewed exam findings and home exercise program Person educated: Patient Education method: Explanation, Demonstration, Tactile cues, Verbal cues, and Handouts Education comprehension: verbalized understanding, returned demonstration, verbal cues required, tactile cues required, and needs further education  HOME EXERCISE PROGRAM: [id number]  ASSESSMENT:  CLINICAL IMPRESSION: Surya notes that overall, symptoms have made significant progress since starting physical therapy.   With continued careful attention to correct body mechanics and with continued consistent home exercise program compliance, Airika appears ready for transfer into independent rehabilitation.  She is aware she can return at any time to get any questions answered or if progress with her independent rehabilitation is not as expected.  OBJECTIVE IMPAIRMENTS: Abnormal gait, decreased activity tolerance, decreased endurance, decreased knowledge of condition, difficulty walking, decreased ROM, decreased strength, decreased safety awareness, impaired perceived functional ability, impaired flexibility, improper body mechanics, postural dysfunction, obesity, and pain.   ACTIVITY LIMITATIONS: lifting, bending, standing, bed mobility, and locomotion level  PARTICIPATION LIMITATIONS: interpersonal relationship and community activity  PERSONAL FACTORS: OA, skin cancer, HTN,  previous Rt knee scope, atrial fibrillation, Lt TKA are also affecting patient's functional outcome.   REHAB POTENTIAL: Good  CLINICAL DECISION MAKING: Stable/uncomplicated  EVALUATION COMPLEXITY: Low   GOALS: Goals reviewed with patient? Yes  SHORT TERM GOALS: Target date: 06/13/2022   Improve radicular symptoms-free lumbar extension to at least 5 degrees Baseline:  Radicular symptoms at < 5 degrees Goal status: Met 06/13/2022  2.  Anarely will be independent with her day 1 HEP Baseline: Started 05/16/2022 Goal status: Met 06/11/2022  3.  Improve bilateral hamstrings flexibility to 45 degrees and hip ER to 40 degrees Baseline: 40 degrees hamstrings and 22/35 degrees hip ER Goal status: Met 06/13/2022   LONG TERM GOALS: Target date: 07/11/2022  Improve FOTO to 65 in 10 visits Baseline: 53 Goal status: Met 07/08/2022  2.  Meshayla will report low back pain consistently < 3/10 on the Numeric Pain Rating Scale with no complaints of peripheral symptoms Baseline: Low back and left posterior thigh/calf 1-4/10 Goal status: Partially met 07/08/2022  3.  Liller will be able to perform ADLs around the house including bed mobility with correct spine/body mechanics Baseline: Education started 05/16/2022 Goal status: Met 06/23/2022  4.  Jessalyn will be independent with her long-term HEP at DC Baseline: Started 05/16/2022 Goal status: Met 07/08/2022  PLAN:  PT FREQUENCY: DC  PT DURATION: DC  PLANNED INTERVENTIONS: Therapeutic exercises, Therapeutic activity, Neuromuscular re-education, Gait training, Patient/Family education, Self Care, Joint mobilization, Dry Needling, Cryotherapy, Moist heat, Traction, and Manual therapy.  PLAN FOR NEXT SESSION: DC   Cherlyn Cushing, PT, MPT 07/08/2022, 6:20 PM

## 2022-07-11 ENCOUNTER — Encounter: Payer: Medicare Other | Admitting: Rehabilitative and Restorative Service Providers"

## 2022-07-22 DIAGNOSIS — E668 Other obesity: Secondary | ICD-10-CM | POA: Diagnosis not present

## 2022-07-22 DIAGNOSIS — Z6838 Body mass index (BMI) 38.0-38.9, adult: Secondary | ICD-10-CM | POA: Diagnosis not present

## 2022-07-22 DIAGNOSIS — E099 Drug or chemical induced diabetes mellitus without complications: Secondary | ICD-10-CM | POA: Diagnosis not present

## 2022-08-26 ENCOUNTER — Other Ambulatory Visit: Payer: Self-pay | Admitting: Cardiology

## 2022-09-03 DIAGNOSIS — R220 Localized swelling, mass and lump, head: Secondary | ICD-10-CM | POA: Diagnosis not present

## 2022-09-03 DIAGNOSIS — H6692 Otitis media, unspecified, left ear: Secondary | ICD-10-CM | POA: Diagnosis not present

## 2022-09-12 ENCOUNTER — Other Ambulatory Visit: Payer: Self-pay | Admitting: Family Medicine

## 2022-09-12 DIAGNOSIS — Z1231 Encounter for screening mammogram for malignant neoplasm of breast: Secondary | ICD-10-CM

## 2022-09-30 ENCOUNTER — Other Ambulatory Visit: Payer: Self-pay | Admitting: Cardiology

## 2022-09-30 DIAGNOSIS — I480 Paroxysmal atrial fibrillation: Secondary | ICD-10-CM

## 2022-09-30 NOTE — Telephone Encounter (Signed)
Prescription refill request for Eliquis received. Indication: Afib  Last office visit: 06/27/22 Mayford Knife)  Scr: 0.72 (05/18/22)  Age: 77 Weight: 101.9kg  Appropriate dose. Refill sent.

## 2022-10-06 ENCOUNTER — Ambulatory Visit: Payer: Medicare Other

## 2022-10-22 DIAGNOSIS — Z23 Encounter for immunization: Secondary | ICD-10-CM | POA: Diagnosis not present

## 2022-10-25 ENCOUNTER — Other Ambulatory Visit: Payer: Self-pay | Admitting: Cardiology

## 2022-11-06 ENCOUNTER — Ambulatory Visit
Admission: RE | Admit: 2022-11-06 | Discharge: 2022-11-06 | Disposition: A | Discharge status: Home / Self Care | Payer: Medicare Other | Source: Ambulatory Visit | Attending: Family Medicine | Admitting: Family Medicine

## 2022-11-06 DIAGNOSIS — Z1231 Encounter for screening mammogram for malignant neoplasm of breast: Secondary | ICD-10-CM | POA: Diagnosis not present

## 2022-11-27 ENCOUNTER — Ambulatory Visit (HOSPITAL_COMMUNITY)
Admission: RE | Admit: 2022-11-27 | Discharge: 2022-11-27 | Disposition: A | Discharge status: Home / Self Care | Payer: Medicare Other | Source: Ambulatory Visit | Attending: Physician Assistant | Admitting: Physician Assistant

## 2022-11-27 ENCOUNTER — Encounter (HOSPITAL_COMMUNITY): Payer: Self-pay | Admitting: Physician Assistant

## 2022-11-27 VITALS — BP 138/72 | HR 62 | Ht 64.0 in | Wt 219.8 lb

## 2022-11-27 DIAGNOSIS — G4733 Obstructive sleep apnea (adult) (pediatric): Secondary | ICD-10-CM | POA: Diagnosis not present

## 2022-11-27 DIAGNOSIS — I10 Essential (primary) hypertension: Secondary | ICD-10-CM | POA: Diagnosis not present

## 2022-11-27 DIAGNOSIS — I480 Paroxysmal atrial fibrillation: Secondary | ICD-10-CM | POA: Diagnosis not present

## 2022-11-27 DIAGNOSIS — Z6837 Body mass index (BMI) 37.0-37.9, adult: Secondary | ICD-10-CM | POA: Diagnosis not present

## 2022-11-27 DIAGNOSIS — E669 Obesity, unspecified: Secondary | ICD-10-CM | POA: Diagnosis not present

## 2022-11-27 DIAGNOSIS — Z7901 Long term (current) use of anticoagulants: Secondary | ICD-10-CM | POA: Insufficient documentation

## 2022-11-27 DIAGNOSIS — D6869 Other thrombophilia: Secondary | ICD-10-CM | POA: Insufficient documentation

## 2022-11-27 NOTE — Progress Notes (Signed)
Primary Care Physician: Mila Palmer, MD Referring Physician: [name] Primary Cardiologist: Dr Bridget [name] is a 77 y.o. female with a history of HTN, OSA, atrial fibrillation who presents for follow up in the [facility]. Patient was in her usual state of health until the early morning hours of 05/11/18 when she woke with heart racing, palpitations, and diaphoresis. No associated chest discomfort or SOB. She presented to the ER and was found to be in afib with RVR. Cardioversion was attempted x4 but was unsuccessful. Of note, her K+ was 2.9 and patient admits she had diarrhea for four days prior. She denies ever having symptoms like this before. She was started on Eliquis for a CHADS2VASC score of 4 and metoprolol and discharged in rate controlled afib. She spontaneously converted to SR prior to follow up. She denies significant alcohol use.  Patient was seen at the ED 05/10/22 with sudden onset tachypalpitations after waking that morning. ECG showed afib with RVR. She spontaneously converted to SR. Of note, she was being treated with prednisone for hip pain at the time. That was her first known episode of afib since 2022.   On follow up today, patient reports that she has done well since her last visit. She does report one interim episode of elevated heart rates. One evening, she did not take her antacid medication and was having indigestion and she noted her heart rate was ~120 bpm. She took her medication and her symptoms resolved and her heart rate normalized.   Today, she denies symptoms of palpitations, chest pain, shortness of breath, orthopnea, PND, dizziness, presyncope, syncope, bleeding, or neurologic sequela. The patient is tolerating medications without difficulties and is otherwise without complaint today.    Atrial Fibrillation Risk Factors:  she does have a diagnosis of sleep apnea. she does not have a history of rheumatic  fever. she does not have a history of alcohol use. The patient does not have a history of early familial atrial fibrillation or other arrhythmias.   Atrial Fibrillation Management history:  Previous antiarrhythmic drugs: none Previous cardioversions: 2020 Previous ablations: none Anticoagulation history: Eliquis   Past Medical History:  Diagnosis Date   Arthritis    Cancer (HCC)    basal cell cancer removed on face   Hypertension    Seasonal allergies    Sleep apnea     Current Outpatient Medications  Medication Sig Dispense Refill   alendronate (FOSAMAX) 70 MG tablet Take 70 mg by mouth once a week.     benazepril (LOTENSIN) 40 MG tablet TAKE 1 TABLET BY MOUTH EVERY DAY 90 tablet 3   calcium carbonate (OSCAL) 1500 (600 Ca) MG TABS tablet Take 600 mg of elemental calcium by mouth daily with breakfast.     cholecalciferol (VITAMIN D3) 25 MCG (1000 UNIT) tablet Take 1,000 Units by mouth daily.     diltiazem (CARDIZEM CD) 240 MG 24 hr capsule TAKE 1 CAPSULE BY MOUTH EVERY DAY 90 capsule 3   diltiazem (CARDIZEM) 30 MG tablet Take 1 tablet every 4 hours AS NEEDED for AFIB heart rate >100 as long as top BP >100. 30 tablet 1   ELIQUIS 5 MG TABS tablet TAKE 1 TABLET BY MOUTH TWICE A DAY 180 tablet 1   famotidine (PEPCID) 20 MG tablet Take 20 mg by mouth daily as needed (acid reflux).     fluticasone (FLONASE) 50 MCG/ACT nasal spray Place 1 spray into both nostrils daily as  needed for allergies.     furosemide (LASIX) 20 MG tablet TAKE 2 TABLETS (40) MG FOR 2 DAYS THEN 1 TABLET (20) MG DAILY. 90 tablet 3   Glucose Blood (BLOOD GLUCOSE TEST STRIPS) STRP Check once a day In Vitro , lancet, meter for 90 days     loratadine (CLARITIN) 10 MG tablet Take 1 tablet by mouth daily as needed for allergies.     meclizine (ANTIVERT) 25 MG tablet Take 1 tablet (25 mg total) by mouth 3 (three) times daily as needed for dizziness. 30 tablet 0   Multiple Vitamin (MULTIVITAMIN WITH MINERALS) TABS tablet  Take 1 tablet by mouth daily.     potassium chloride SA (KLOR-CON M20) 20 MEQ tablet Take 1 tablet (20 mEq total) by mouth daily. 90 tablet 3   pravastatin (PRAVACHOL) 20 MG tablet Take 20 mg by mouth at bedtime.     No current facility-administered medications for this encounter.    ROS- All systems are reviewed and negative except as per the HPI above.  Physical Exam: Vitals:   11/27/22 1027  BP: 138/72  Pulse: 62  Weight: 99.7 kg  Height: 5\' 4"  (1.626 m)     GEN: Well nourished, well developed in no acute distress NECK: No JVD; No carotid bruits CARDIAC: Regular rate and rhythm, no murmurs, rubs, gallops RESPIRATORY:  Clear to auscultation without rales, wheezing or rhonchi  ABDOMEN: Soft, non-tender, non-distended EXTREMITIES:  No edema; No deformity    Wt Readings from Last 3 Encounters:  11/27/22 99.7 kg  06/27/22 101.9 kg  05/27/22 101.5 kg    EKG today demonstrates  SR Vent. rate 62 BPM PR interval 166 ms QRS duration 84 ms QT/QTcB 418/424 ms   Echo 06/10/18 1. The left ventricle has normal systolic function with an ejection fraction of 60-65%. The cavity size was normal. Left ventricular diastolic Doppler parameters are indeterminate. No evidence of left ventricular regional wall motion abnormalities.  2. The right ventricle has normal systolic function. The cavity was normal. There is no increase in right ventricular wall thickness.  3. No evidence of mitral valve stenosis.  4. The aortic valve is tricuspid. No stenosis of the aortic valve.  5. The aortic root, ascending aorta and aortic arch are normal in size and structure.   Epic records are reviewed at length today  CHA2DS2-VASc Score = 4  The patient's score is based upon: CHF History: 0 HTN History: 1 Diabetes History: 0 Stroke History: 0 Vascular Disease History: 0 Age Score: 2 Gender Score: 1         ASSESSMENT AND PLAN: Paroxysmal Atrial Fibrillation (ICD10:  I48.0) The patient's  CHA2DS2-VASc score is 4, indicating a 4.8% annual risk of stroke.   Patient appears to be maintaining SR Can consider AAD if her afib becomes more frequent.  Continue diltiazem 240 mg daily with 30 mg q 4 hours PRN for heart racing. Continue Eliquis 5 mg BID  Secondary Hypercoagulable State (ICD10:  D68.69) The patient is at significant risk for stroke/thromboembolism based upon her CHA2DS2-VASc Score of 4.  Continue Apixaban (Eliquis).   Obesity Body mass index is 37.73 kg/m.  Encouraged lifestyle modification  OSA  Encouraged nightly CPAP Followed by Dr Mayford Knife.  HTN Stable on current regimen   Follow up in the AF clinic in one year.    Bridget Loa PA-C [facility] [address] [place], [place] [address] [phone] 11/27/2022 10:52 AM

## 2022-12-08 DIAGNOSIS — E7800 Pure hypercholesterolemia, unspecified: Secondary | ICD-10-CM | POA: Diagnosis not present

## 2022-12-08 DIAGNOSIS — Z79899 Other long term (current) drug therapy: Secondary | ICD-10-CM | POA: Diagnosis not present

## 2022-12-08 DIAGNOSIS — E559 Vitamin D deficiency, unspecified: Secondary | ICD-10-CM | POA: Diagnosis not present

## 2022-12-08 DIAGNOSIS — E099 Drug or chemical induced diabetes mellitus without complications: Secondary | ICD-10-CM | POA: Diagnosis not present

## 2022-12-08 LAB — LAB REPORT - SCANNED
A1c: 5.8
EGFR: 89

## 2022-12-09 DIAGNOSIS — E7800 Pure hypercholesterolemia, unspecified: Secondary | ICD-10-CM | POA: Diagnosis not present

## 2022-12-09 DIAGNOSIS — E099 Drug or chemical induced diabetes mellitus without complications: Secondary | ICD-10-CM | POA: Diagnosis not present

## 2022-12-09 DIAGNOSIS — Z79899 Other long term (current) drug therapy: Secondary | ICD-10-CM | POA: Diagnosis not present

## 2022-12-09 DIAGNOSIS — Z0000 Encounter for general adult medical examination without abnormal findings: Secondary | ICD-10-CM | POA: Diagnosis not present

## 2022-12-09 DIAGNOSIS — I10 Essential (primary) hypertension: Secondary | ICD-10-CM | POA: Diagnosis not present

## 2022-12-09 DIAGNOSIS — E559 Vitamin D deficiency, unspecified: Secondary | ICD-10-CM | POA: Diagnosis not present

## 2022-12-09 DIAGNOSIS — M810 Age-related osteoporosis without current pathological fracture: Secondary | ICD-10-CM | POA: Diagnosis not present

## 2022-12-10 DIAGNOSIS — H524 Presbyopia: Secondary | ICD-10-CM | POA: Diagnosis not present

## 2022-12-10 DIAGNOSIS — H52223 Regular astigmatism, bilateral: Secondary | ICD-10-CM | POA: Diagnosis not present

## 2022-12-10 DIAGNOSIS — H5203 Hypermetropia, bilateral: Secondary | ICD-10-CM | POA: Diagnosis not present

## 2022-12-10 DIAGNOSIS — H43813 Vitreous degeneration, bilateral: Secondary | ICD-10-CM | POA: Diagnosis not present

## 2022-12-10 DIAGNOSIS — H35373 Puckering of macula, bilateral: Secondary | ICD-10-CM | POA: Diagnosis not present

## 2022-12-10 DIAGNOSIS — H2513 Age-related nuclear cataract, bilateral: Secondary | ICD-10-CM | POA: Diagnosis not present

## 2023-01-28 ENCOUNTER — Other Ambulatory Visit: Payer: Self-pay | Admitting: Cardiology

## 2023-03-20 DIAGNOSIS — H2513 Age-related nuclear cataract, bilateral: Secondary | ICD-10-CM | POA: Diagnosis not present

## 2023-03-20 DIAGNOSIS — H43813 Vitreous degeneration, bilateral: Secondary | ICD-10-CM | POA: Diagnosis not present

## 2023-03-20 DIAGNOSIS — H35373 Puckering of macula, bilateral: Secondary | ICD-10-CM | POA: Diagnosis not present

## 2023-04-07 ENCOUNTER — Other Ambulatory Visit: Payer: Self-pay | Admitting: Cardiology

## 2023-04-07 DIAGNOSIS — I480 Paroxysmal atrial fibrillation: Secondary | ICD-10-CM

## 2023-04-07 NOTE — Telephone Encounter (Signed)
 Prescription refill request for Eliquis received. Indication:afib Last office visit:11/24 Scr:0.68  12/24 Age: 78 Weight:99.7  kg  Prescription refilled

## 2023-06-19 DIAGNOSIS — E7800 Pure hypercholesterolemia, unspecified: Secondary | ICD-10-CM | POA: Diagnosis not present

## 2023-06-19 DIAGNOSIS — E099 Drug or chemical induced diabetes mellitus without complications: Secondary | ICD-10-CM | POA: Diagnosis not present

## 2023-06-19 DIAGNOSIS — N3000 Acute cystitis without hematuria: Secondary | ICD-10-CM | POA: Diagnosis not present

## 2023-06-19 DIAGNOSIS — Z79899 Other long term (current) drug therapy: Secondary | ICD-10-CM | POA: Diagnosis not present

## 2023-06-19 DIAGNOSIS — K219 Gastro-esophageal reflux disease without esophagitis: Secondary | ICD-10-CM | POA: Diagnosis not present

## 2023-06-26 DIAGNOSIS — R051 Acute cough: Secondary | ICD-10-CM | POA: Diagnosis not present

## 2023-06-26 DIAGNOSIS — J029 Acute pharyngitis, unspecified: Secondary | ICD-10-CM | POA: Diagnosis not present

## 2023-06-26 DIAGNOSIS — R0981 Nasal congestion: Secondary | ICD-10-CM | POA: Diagnosis not present

## 2023-07-02 DIAGNOSIS — R300 Dysuria: Secondary | ICD-10-CM | POA: Diagnosis not present

## 2023-07-14 NOTE — Progress Notes (Unsigned)
 Date:  07/15/2023   ID:  Bridget [name], DOB [birth year], MRN [id number]   PCP:  Verena Mems, MD  Sleep Medicine:  Wilbert Bihari, MD Electrophysiologist:  None   Chief Complaint:  OSA, PAF  History of Present Illness:    Bridget [name] is a 78 y.o. female with a hx of PAF.   She has had some excessive daytime sleepiness as well as snoring.  She underwent sleep study showing mild OSA with an AHI of 11.1/hr but severe during REM sleep with an AHi of 49/hr.  O2 sats were as low as 89%.  She underwent CPAP titration to 6 cm H2O. Unfortunately she had a reoccurrence of atrial fibrillation May 2024 after taking steroids for neuropathy.  She was at [facility] and spontaneously converted to NSR while there.  Seen back in afib clinic and was started on diltiazem  30mg  q6 hours PRN for breakthrough palpitations.   She is here today for followup and is doing well.  She denies any chest pain or pressure, SOB, DOE, PND, orthopnea,  dizziness, palpitations or syncope. Occasionally she will have some mild LE edema in the summer. She is compliant with her meds and is tolerating meds with no SE.        She is doing well with her PAP device and thinks that she has gotten used to it.  She tolerates the mask and feels the pressure is adequate.  Since going on PAP she feels rested in the am and has no significant daytime sleepiness but sometimes will nap during the day.  She denies any significant mouth or nasal dryness or nasal congestion.  She does not think that she snores.     Prior CV studies:   The following studies were reviewed today:  None  Past Medical History:  Diagnosis Date   Arthritis    Cancer (HCC)    basal cell cancer removed on face   Hypertension    Seasonal allergies    Sleep apnea    Past Surgical History:  Procedure Laterality Date   BREAST EXCISIONAL BIOPSY Left    CHOLECYSTECTOMY  02/2010   KNEE SURGERY Right    Meniscal tear   OOPHORECTOMY  01/2003   RSO AT [facility]    TOTAL KNEE ARTHROPLASTY Left 10/18/2021   Procedure: LEFT TOTAL KNEE ARTHROPLASTY;  Surgeon: Vernetta Lonni GRADE, MD;  Location: [facility] ORS;  Service: Orthopedics;  Laterality: Left;   VAGINAL HYSTERECTOMY  01/2003   TVH,RSO A&P REPAIR   WISDOM TOOTH EXTRACTION       Current Meds  Medication Sig   alendronate (FOSAMAX) 70 MG tablet Take 70 mg by mouth once a week.   benazepril  (LOTENSIN ) 40 MG tablet TAKE 1 TABLET BY MOUTH EVERY DAY   calcium  carbonate (OSCAL) 1500 (600 Ca) MG TABS tablet Take 600 mg of elemental calcium  by mouth daily with breakfast.   cholecalciferol  (VITAMIN D3) 25 MCG (1000 UNIT) tablet Take 1,000 Units by mouth daily.   diltiazem  (CARDIZEM  CD) 240 MG 24 hr capsule TAKE 1 CAPSULE BY MOUTH EVERY DAY   diltiazem  (CARDIZEM ) 30 MG tablet Take 1 tablet every 4 hours AS NEEDED for AFIB heart rate >100 as long as top BP >100.   ELIQUIS  5 MG TABS tablet TAKE 1 TABLET BY MOUTH TWICE A DAY   fluticasone (FLONASE) 50 MCG/ACT nasal spray Place 1 spray into both nostrils daily as needed for allergies.   furosemide  (LASIX ) 20 MG tablet TAKE 2 TABLETS (  40) MG FOR 2 DAYS THEN 1 TABLET (20) MG DAILY.   Glucose Blood (BLOOD GLUCOSE TEST STRIPS) STRP Check once a day In Vitro , lancet, meter for 90 days   loratadine (CLARITIN) 10 MG tablet Take 1 tablet by mouth daily as needed for allergies.   meclizine  (ANTIVERT ) 25 MG tablet Take 1 tablet (25 mg total) by mouth 3 (three) times daily as needed for dizziness.   Multiple Vitamin (MULTIVITAMIN WITH MINERALS) TABS tablet Take 1 tablet by mouth daily.   omeprazole (PRILOSEC) 40 MG capsule    potassium chloride  SA (KLOR-CON  M20) 20 MEQ tablet TAKE 1 TABLET BY MOUTH EVERY DAY   pravastatin  (PRAVACHOL ) 20 MG tablet Take 20 mg by mouth at bedtime.     Allergies:   Codeine   Social History   Tobacco Use   Smoking status: Never   Smokeless tobacco: Never   Tobacco comments:    Never smoked 11/27/22  Vaping Use   Vaping status: Never  Used  Substance Use Topics   Alcohol use: Yes    Comment: SELDOM   Drug use: No     Family Hx: The patient's family history includes Breast cancer (age of onset: 67) in her sister; Cancer in her father; Cancer (age of onset: 80) in her brother; Diabetes in her father; Heart disease in her mother; Hypertension in her father.  ROS: What he mean Please see the history of present illness.     All other systems reviewed and are negative.   Labs/Other Tests and Data Reviewed:    Recent Labs: No results found for requested labs within last 365 days.   Recent Lipid Panel No results found for: CHOL, TRIG, HDL, CHOLHDL, LDLCALC, LDLDIRECT  Wt Readings from Last 3 Encounters:  07/15/23 224 lb 3.2 oz (101.7 kg)  11/27/22 219 lb 12.8 oz (99.7 kg)  06/27/22 224 lb 9.6 oz (101.9 kg)     Objective:    Vital Signs:  BP 126/64   Pulse 65   Ht 5' 4 (1.626 m)   Wt 224 lb 3.2 oz (101.7 kg)   LMP 01/07/2003   SpO2 99%   BMI 38.48 kg/m    GEN: Well nourished, well developed in no acute distress HEENT: Normal NECK: No JVD; No carotid bruits LYMPHATICS: No lymphadenopathy CARDIAC:RRR, no  rubs, gallops 1/6 SM at RUSB RESPIRATORY:  Clear to auscultation without rales, wheezing or rhonchi  ABDOMEN: Soft, non-tender, non-distended MUSCULOSKELETAL:  1+ BLE edema; No deformity  SKIN: Warm and dry NEUROLOGIC:  Alert and oriented x 3 PSYCHIATRIC:  Normal affect   ASSESSMENT/PLAN:  OSA - The patient is tolerating PAP therapy well without any problems. The PAP download performed by his DME was personally reviewed and interpreted by me today and showed an AHI of 2.7 /hr on 6 cm H2O with 97% compliance in using more than 4 hours nightly.  The patient has been using and benefiting from PAP use and will continue to benefit from therapy.   HTN - BP is controlled on exam today - Continue benazepril  40 mg daily, Cardizem  CD 240 mg daily with as needed refills - I will get a copy of  last BMET from PCP  Chronic LE edema - As long as she takes her diuretics her lower extremity edema is well-controlled -Continue Lasix  20 mg daily with as needed refills as well as K-Dur 20 mEq daily -Encouraged to follow a less than 2 g sodium diet and keep legs elevated when they  swell  PAF - She is maintaining normal sinus rhythm on exam today - No bleeding problems on DOAC and denies palpitations - I have personally reviewed and interpreted outside labs performed by patient's PCP which showed Hbg 12.4 on 12/08/2022 - Continue Cardizem  CD 240 mg daily and Cardizem  short acting 30 mg every 4 hours as needed for palpitations with heart rate greater in the 100 bpm, Eliquis  5 mg twice daily with as needed refills  Heart Murmur -check 2D echo  Medication Adjustments/Labs and Tests Ordered: Current medicines are reviewed at length with the patient today.  Concerns regarding medicines are outlined above.  Tests Ordered: No orders of the defined types were placed in this encounter.   Medication Changes: No orders of the defined types were placed in this encounter.    Disposition:  Follow up in 1 year(s)  Signed, Wilbert Bihari, MD  07/15/2023 9:06 AM    [facility]

## 2023-07-15 ENCOUNTER — Encounter: Payer: Self-pay | Admitting: Cardiology

## 2023-07-15 ENCOUNTER — Ambulatory Visit: Attending: Cardiology | Admitting: Cardiology

## 2023-07-15 VITALS — BP 126/64 | HR 65 | Ht 64.0 in | Wt 224.2 lb

## 2023-07-15 DIAGNOSIS — I480 Paroxysmal atrial fibrillation: Secondary | ICD-10-CM | POA: Diagnosis not present

## 2023-07-15 DIAGNOSIS — G4733 Obstructive sleep apnea (adult) (pediatric): Secondary | ICD-10-CM | POA: Diagnosis not present

## 2023-07-15 DIAGNOSIS — R600 Localized edema: Secondary | ICD-10-CM | POA: Diagnosis not present

## 2023-07-15 DIAGNOSIS — R011 Cardiac murmur, unspecified: Secondary | ICD-10-CM | POA: Insufficient documentation

## 2023-07-15 DIAGNOSIS — I10 Essential (primary) hypertension: Secondary | ICD-10-CM | POA: Insufficient documentation

## 2023-07-15 MED ORDER — DILTIAZEM HCL 30 MG PO TABS: ORAL_TABLET | ORAL | 1 refills | Status: AC

## 2023-07-15 NOTE — Patient Instructions (Signed)
 Medication Instructions:  Your physician recommends that you continue on your current medications as directed. Please refer to the Current Medication list given to you today.  *If you need a refill on your cardiac medications before your next appointment, please call your pharmacy*  Lab Work: Please contact your PCP office and ask that they fax a copy of your BMET results to our office at [phone].  If you have labs (blood work) drawn today and your tests are completely normal, you will receive your results only by: MyChart Message (if you have MyChart) OR A paper copy in the mail If you have any lab test that is abnormal or we need to change your treatment, we will call you to review the results.  Testing/Procedures: Your physician has requested that you have an echocardiogram. Echocardiography is a painless test that uses sound waves to create images of your heart. It provides your doctor with information about the size and shape of your heart and how well your heart's chambers and valves are working. This procedure takes approximately one hour. There are no restrictions for this procedure. Please do NOT wear cologne, perfume, aftershave, or lotions (deodorant is allowed). Please arrive 15 minutes prior to your appointment time.  Please note: We ask at that you not bring children with you during ultrasound (echo/ vascular) testing. Due to room size and safety concerns, children are not allowed in the ultrasound rooms during exams. Our front office staff cannot provide observation of children in our lobby area while testing is being conducted. An adult accompanying a patient to their appointment will only be allowed in the ultrasound room at the discretion of the ultrasound technician under special circumstances. We apologize for any inconvenience.   Follow-Up: At [facility], you and your health needs are our priority.  As part of our continuing mission to provide you with  exceptional heart care, our providers are all part of one team.  This team includes your primary Cardiologist (physician) and Advanced Practice Providers or APPs (Physician Assistants and Nurse Practitioners) who all work together to provide you with the care you need, when you need it.  Your next appointment:   1 year(s)  Provider:   Wilbert Bihari, MD

## 2023-07-15 NOTE — Addendum Note (Signed)
 Addended by: JANIT GENI CROME on: 07/15/2023 09:13 AM   Modules accepted: Orders

## 2023-07-20 DIAGNOSIS — R300 Dysuria: Secondary | ICD-10-CM | POA: Diagnosis not present

## 2023-07-22 ENCOUNTER — Other Ambulatory Visit: Payer: Self-pay | Admitting: Cardiology

## 2023-08-17 ENCOUNTER — Telehealth (HOSPITAL_COMMUNITY): Payer: Self-pay | Admitting: *Deleted

## 2023-08-17 DIAGNOSIS — N390 Urinary tract infection, site not specified: Secondary | ICD-10-CM | POA: Diagnosis not present

## 2023-08-17 DIAGNOSIS — R399 Unspecified symptoms and signs involving the genitourinary system: Secondary | ICD-10-CM | POA: Diagnosis not present

## 2023-08-17 NOTE — Telephone Encounter (Signed)
 Patient called to report episode of afib last PM that required use of PRN cardizem  which helped her convert back to normal rhythm.  Pt was diagnosed with an UTI today at urgent care. Pt will call if further issues.

## 2023-08-20 ENCOUNTER — Ambulatory Visit (HOSPITAL_COMMUNITY)
Admission: RE | Admit: 2023-08-20 | Discharge: 2023-08-20 | Disposition: A | Discharge status: Home / Self Care | Source: Ambulatory Visit | Attending: Cardiology | Admitting: Cardiology

## 2023-08-20 DIAGNOSIS — R011 Cardiac murmur, unspecified: Secondary | ICD-10-CM

## 2023-08-20 LAB — ECHOCARDIOGRAM COMPLETE
Area-P 1/2: 3.08 cm2
S' Lateral: 3.1 cm

## 2023-08-21 ENCOUNTER — Ambulatory Visit: Payer: Self-pay | Admitting: Cardiology

## 2023-08-31 ENCOUNTER — Other Ambulatory Visit: Payer: Self-pay | Admitting: Cardiology

## 2023-08-31 MED ORDER — DILTIAZEM HCL ER COATED BEADS 240 MG PO CP24: 240.0000 mg | ORAL_CAPSULE | Freq: Every day | ORAL | 3 refills | Status: DC

## 2023-09-15 DIAGNOSIS — N390 Urinary tract infection, site not specified: Secondary | ICD-10-CM | POA: Diagnosis not present

## 2023-09-15 DIAGNOSIS — R399 Unspecified symptoms and signs involving the genitourinary system: Secondary | ICD-10-CM | POA: Diagnosis not present

## 2023-09-17 DIAGNOSIS — N390 Urinary tract infection, site not specified: Secondary | ICD-10-CM | POA: Diagnosis not present

## 2023-09-22 DIAGNOSIS — H43813 Vitreous degeneration, bilateral: Secondary | ICD-10-CM | POA: Diagnosis not present

## 2023-09-22 DIAGNOSIS — H35373 Puckering of macula, bilateral: Secondary | ICD-10-CM | POA: Diagnosis not present

## 2023-09-22 DIAGNOSIS — H2513 Age-related nuclear cataract, bilateral: Secondary | ICD-10-CM | POA: Diagnosis not present

## 2023-09-26 ENCOUNTER — Other Ambulatory Visit: Payer: Self-pay | Admitting: Cardiology

## 2023-09-26 DIAGNOSIS — I480 Paroxysmal atrial fibrillation: Secondary | ICD-10-CM

## 2023-09-28 NOTE — Telephone Encounter (Signed)
 Prescription refill request for Eliquis  received. Indication:afib Last office visit:7/25 Scr:0.68  12/24 Age: 78 Weight:101.7  kg  Prescription refilled

## 2023-10-19 ENCOUNTER — Other Ambulatory Visit: Payer: Self-pay | Admitting: Cardiology

## 2023-10-21 DIAGNOSIS — N952 Postmenopausal atrophic vaginitis: Secondary | ICD-10-CM | POA: Diagnosis not present

## 2023-10-21 DIAGNOSIS — N3020 Other chronic cystitis without hematuria: Secondary | ICD-10-CM | POA: Diagnosis not present

## 2023-10-21 DIAGNOSIS — B356 Tinea cruris: Secondary | ICD-10-CM | POA: Diagnosis not present

## 2023-10-21 DIAGNOSIS — N393 Stress incontinence (female) (male): Secondary | ICD-10-CM | POA: Diagnosis not present

## 2023-10-22 DIAGNOSIS — B356 Tinea cruris: Secondary | ICD-10-CM | POA: Diagnosis not present

## 2023-10-22 DIAGNOSIS — N952 Postmenopausal atrophic vaginitis: Secondary | ICD-10-CM | POA: Diagnosis not present

## 2023-10-22 DIAGNOSIS — N393 Stress incontinence (female) (male): Secondary | ICD-10-CM | POA: Diagnosis not present

## 2023-10-22 DIAGNOSIS — N3020 Other chronic cystitis without hematuria: Secondary | ICD-10-CM | POA: Diagnosis not present

## 2023-10-28 ENCOUNTER — Other Ambulatory Visit: Payer: Self-pay | Admitting: Family Medicine

## 2023-10-28 DIAGNOSIS — Z1231 Encounter for screening mammogram for malignant neoplasm of breast: Secondary | ICD-10-CM

## 2023-11-06 DIAGNOSIS — Z23 Encounter for immunization: Secondary | ICD-10-CM | POA: Diagnosis not present

## 2023-11-20 ENCOUNTER — Ambulatory Visit
Admission: RE | Admit: 2023-11-20 | Discharge: 2023-11-20 | Disposition: A | Discharge status: Home / Self Care | Source: Ambulatory Visit | Attending: Family Medicine | Admitting: Family Medicine

## 2023-11-20 DIAGNOSIS — Z1231 Encounter for screening mammogram for malignant neoplasm of breast: Secondary | ICD-10-CM | POA: Diagnosis not present

## 2023-11-27 ENCOUNTER — Ambulatory Visit (HOSPITAL_COMMUNITY)
Admission: RE | Admit: 2023-11-27 | Discharge: 2023-11-27 | Disposition: A | Discharge status: Home / Self Care | Source: Ambulatory Visit | Attending: Physician Assistant | Admitting: Physician Assistant

## 2023-11-27 ENCOUNTER — Encounter (HOSPITAL_COMMUNITY): Payer: Self-pay | Admitting: Physician Assistant

## 2023-11-27 VITALS — BP 134/78 | HR 66 | Ht 64.0 in | Wt 225.0 lb

## 2023-11-27 DIAGNOSIS — D6869 Other thrombophilia: Secondary | ICD-10-CM | POA: Insufficient documentation

## 2023-11-27 DIAGNOSIS — I480 Paroxysmal atrial fibrillation: Secondary | ICD-10-CM | POA: Diagnosis present

## 2023-11-27 MED ORDER — MECLIZINE HCL 25 MG PO TABS: 25.0000 mg | ORAL_TABLET | Freq: Three times a day (TID) | ORAL | 0 refills | Status: AC | PRN

## 2023-11-27 NOTE — Progress Notes (Signed)
 Primary Care Physician: Chet Mad, DO Referring Physician: [facility] Primary Cardiologist: Dr Shlomo Nena JINNY Bridget [name] is a 78 y.o. female with a history of HTN, OSA, atrial fibrillation who presents for follow up in the [facility]. Patient was in her usual state of health until the early morning hours of 05/11/18 when she woke with heart racing, palpitations, and diaphoresis. No associated chest discomfort or SOB. She presented to the ER and was found to be in afib with RVR. Cardioversion was attempted x4 but was unsuccessful. Of note, her K+ was 2.9 and patient admits she had diarrhea for four days prior. She denies ever having symptoms like this before. She was started on Eliquis  for stroke prevention and metoprolol  and discharged in rate controlled afib. She spontaneously converted to SR prior to follow up. She denies significant alcohol use.  Patient was seen at the ED 05/10/22 with sudden onset tachypalpitations after waking that morning. ECG showed afib with RVR. She spontaneously converted to SR. Of note, she was being treated with prednisone  for hip pain at the time. That was her first known episode of afib since 2022.   Patient returns for follow up for atrial fibrillation. She reports that she has had one interim episode of afib that resolved quickly with PRN diltiazem . This was in the setting of a UTI. No bleeding issues on anticoagulation.   Today, she  denies symptoms of palpitations, chest pain, shortness of breath, orthopnea, PND, lower extremity edema, dizziness, presyncope, syncope, bleeding, or neurologic sequela. The patient is tolerating medications without difficulties and is otherwise without complaint today.    Atrial Fibrillation Risk Factors:  she does have a diagnosis of sleep apnea. she does not have a history of rheumatic fever. she does not have a history of alcohol use. The patient does not have a history of early  familial atrial fibrillation or other arrhythmias.   Atrial Fibrillation Management history:  Previous antiarrhythmic drugs: none Previous cardioversions: 2020 Previous ablations: none Anticoagulation history: Eliquis    Past Medical History:  Diagnosis Date   Arthritis    Cancer (HCC)    basal cell cancer removed on face   Hypertension    Seasonal allergies    Sleep apnea     Current Outpatient Medications  Medication Sig Dispense Refill   alendronate (FOSAMAX) 70 MG tablet Take 70 mg by mouth once a week.     benazepril  (LOTENSIN ) 40 MG tablet TAKE 1 TABLET BY MOUTH EVERY DAY 90 tablet 2   calcium  carbonate (OSCAL) 1500 (600 Ca) MG TABS tablet Take 600 mg of elemental calcium  by mouth daily with breakfast.     cephALEXin  (KEFLEX ) 250 MG capsule Take 250 mg by mouth 3 (three) times daily.     cholecalciferol  (VITAMIN D3) 25 MCG (1000 UNIT) tablet Take 1,000 Units by mouth daily.     diltiazem  (CARDIZEM  CD) 240 MG 24 hr capsule Take 1 capsule (240 mg total) by mouth daily. 90 capsule 3   diltiazem  (CARDIZEM ) 30 MG tablet Take 1 tablet every 4 hours AS NEEDED for AFIB heart rate >100 as long as top BP >100. 30 tablet 1   ELIQUIS  5 MG TABS tablet TAKE 1 TABLET BY MOUTH TWICE A DAY 180 tablet 1   fluticasone (FLONASE) 50 MCG/ACT nasal spray Place 1 spray into both nostrils daily as needed for allergies.     furosemide  (LASIX ) 20 MG tablet Take 1 tablet (20 mg total) by mouth  daily. 90 tablet 2   Glucose Blood (BLOOD GLUCOSE TEST STRIPS) STRP Check once a day In Vitro , lancet, meter for 90 days     loratadine (CLARITIN) 10 MG tablet Take 1 tablet by mouth daily as needed for allergies.     meclizine  (ANTIVERT ) 25 MG tablet Take 1 tablet (25 mg total) by mouth 3 (three) times daily as needed for dizziness. 30 tablet 0   Multiple Vitamin (MULTIVITAMIN WITH MINERALS) TABS tablet Take 1 tablet by mouth daily.     omeprazole (PRILOSEC) 40 MG capsule      potassium chloride  SA (KLOR-CON   M) 20 MEQ tablet TAKE 1 TABLET BY MOUTH EVERY DAY 90 tablet 3   pravastatin  (PRAVACHOL ) 20 MG tablet Take 20 mg by mouth at bedtime.     No current facility-administered medications for this encounter.    ROS- All systems are reviewed and negative except as per the HPI above.  Physical Exam: Vitals:   11/27/23 0855  BP: 134/78  Pulse: 66  Weight: 102.1 kg  Height: 5' 4 (1.626 m)    GEN: Well nourished, well developed in no acute distress CARDIAC: Regular rate and rhythm, no murmurs, rubs, gallops RESPIRATORY:  Clear to auscultation without rales, wheezing or rhonchi  ABDOMEN: Soft, non-tender, non-distended EXTREMITIES:  No edema; No deformity    Wt Readings from Last 3 Encounters:  11/27/23 102.1 kg  07/15/23 101.7 kg  11/27/22 99.7 kg    EKG Interpretation Date/Time:  Friday November 27 2023 08:58:20 EST Ventricular Rate:  66 PR Interval:  166 QRS Duration:  86 QT Interval:  426 QTC Calculation: 446 R Axis:   -3  Text Interpretation: Normal sinus rhythm Low voltage QRS Cannot rule out Anterior infarct (cited on or before 27-May-2022) Abnormal ECG When compared with ECG of 27-Nov-2022 10:29, No significant change was found Confirmed by Dontavious Emily ([id number]) on 11/27/2023 9:01:47 AM    Echo 08/20/23  1. Left ventricular ejection fraction, by estimation, is 65 to 70%. The  left ventricle has normal function. The left ventricle has no regional  wall motion abnormalities. Left ventricular diastolic parameters are  consistent with Grade I diastolic dysfunction (impaired relaxation).   2. Right ventricular systolic function is normal. The right ventricular  size is normal.   3. The mitral valve is normal in structure. Trivial mitral valve  regurgitation. No evidence of mitral stenosis.   4. The aortic valve is tricuspid. There is mild calcification of the  aortic valve. Aortic valve regurgitation is not visualized. Aortic valve  sclerosis/calcification is present,  without any evidence of aortic  stenosis.   5. The inferior vena cava is normal in size with greater than 50%  respiratory variability, suggesting right atrial pressure of 3 mmHg.    Epic records are reviewed at length today   CHA2DS2-VASc Score = 4  The patient's score is based upon: CHF History: 0 HTN History: 1 Diabetes History: 0 Stroke History: 0 Vascular Disease History: 0 Age Score: 2 Gender Score: 1       ASSESSMENT AND PLAN: Paroxysmal Atrial Fibrillation (ICD10:  I48.0) The patient's CHA2DS2-VASc score is 4, indicating a 4.8% annual risk of stroke.   Patient appears to be maintaining SR Continue diltiazem  240 mg daily with 30 mg PRN q 4 hours for heart racing.  Continue Eliquis  5 mg BID  Secondary Hypercoagulable State (ICD10:  D68.69) The patient is at significant risk for stroke/thromboembolism based upon her CHA2DS2-VASc Score of 4.  Continue Apixaban  (  Eliquis ). No bleeding issues.   Obesity Body mass index is 38.62 kg/m.  Encouraged lifestyle modification  OSA  Encouraged nightly CPAP  HTN Stable on current regimen   Follow up in the AF clinic in one year.    Daril Kicks PA-C [facility] [address] [place], [place] [address] [phone] 11/27/2023 9:01 AM

## 2023-11-28 LAB — BASIC METABOLIC PANEL WITH GFR
BUN/Creatinine Ratio: 21 (ref 12–28)
BUN: 17 mg/dL (ref 8–27)
CO2: 22 mmol/L (ref 20–29)
Calcium: 9.8 mg/dL (ref 8.7–10.3)
Chloride: 102 mmol/L (ref 96–106)
Creatinine, Ser: 0.82 mg/dL (ref 0.57–1.00)
Glucose: 106 mg/dL — ABNORMAL HIGH (ref 70–99)
Potassium: 4.1 mmol/L (ref 3.5–5.2)
Sodium: 138 mmol/L (ref 134–144)
eGFR: 73 mL/min/1.73 (ref >59)

## 2023-11-28 LAB — CBC
Hematocrit: 41.7 % (ref 34.0–46.6)
Hemoglobin: 12.9 g/dL (ref 11.1–15.9)
MCH: 26.8 pg (ref 26.6–33.0)
MCHC: 30.9 g/dL — ABNORMAL LOW (ref 31.5–35.7)
MCV: 87 fL (ref 79–97)
Platelets: 300 x10E3/uL (ref 150–450)
RBC: 4.81 x10E6/uL (ref 3.77–5.28)
RDW: 13.9 % (ref 11.7–15.4)
WBC: 6.6 x10E3/uL (ref 3.4–10.8)

## 2023-11-30 ENCOUNTER — Ambulatory Visit (HOSPITAL_COMMUNITY): Payer: Self-pay | Admitting: Physician Assistant

## 2024-01-02 ENCOUNTER — Encounter (HOSPITAL_BASED_OUTPATIENT_CLINIC_OR_DEPARTMENT_OTHER): Payer: Self-pay | Admitting: Emergency Medicine

## 2024-01-02 ENCOUNTER — Emergency Department (HOSPITAL_BASED_OUTPATIENT_CLINIC_OR_DEPARTMENT_OTHER)

## 2024-01-02 ENCOUNTER — Emergency Department (HOSPITAL_BASED_OUTPATIENT_CLINIC_OR_DEPARTMENT_OTHER)
Admission: EM | Admit: 2024-01-02 | Discharge: 2024-01-02 | Disposition: A | Discharge status: Home / Self Care | Attending: Emergency Medicine | Admitting: Emergency Medicine

## 2024-01-02 ENCOUNTER — Other Ambulatory Visit: Payer: Self-pay

## 2024-01-02 DIAGNOSIS — Z85828 Personal history of other malignant neoplasm of skin: Secondary | ICD-10-CM | POA: Diagnosis not present

## 2024-01-02 DIAGNOSIS — Z7901 Long term (current) use of anticoagulants: Secondary | ICD-10-CM | POA: Insufficient documentation

## 2024-01-02 DIAGNOSIS — I4891 Unspecified atrial fibrillation: Secondary | ICD-10-CM | POA: Insufficient documentation

## 2024-01-02 DIAGNOSIS — I10 Essential (primary) hypertension: Secondary | ICD-10-CM | POA: Insufficient documentation

## 2024-01-02 HISTORY — DX: Unspecified atrial fibrillation: I48.91

## 2024-01-02 LAB — CBC WITH DIFFERENTIAL/PLATELET
Abs Immature Granulocytes: 0.04 K/uL (ref 0.00–0.07)
Basophils Absolute: 0.1 K/uL (ref 0.0–0.1)
Basophils Relative: 0 %
Eosinophils Absolute: 0 K/uL (ref 0.0–0.5)
Eosinophils Relative: 0 %
HCT: 43.9 % (ref 36.0–46.0)
Hemoglobin: 14.6 g/dL (ref 12.0–15.0)
Immature Granulocytes: 0 %
Lymphocytes Relative: 14 %
Lymphs Abs: 1.7 K/uL (ref 0.7–4.0)
MCH: 27.2 pg (ref 26.0–34.0)
MCHC: 33.3 g/dL (ref 30.0–36.0)
MCV: 81.8 fL (ref 80.0–100.0)
Monocytes Absolute: 0.7 K/uL (ref 0.1–1.0)
Monocytes Relative: 6 %
Neutro Abs: 9.2 K/uL — ABNORMAL HIGH (ref 1.7–7.7)
Neutrophils Relative %: 80 %
Platelets: 343 K/uL (ref 150–400)
RBC: 5.37 MIL/uL — ABNORMAL HIGH (ref 3.87–5.11)
RDW: 14.5 % (ref 11.5–15.5)
WBC: 11.7 K/uL — ABNORMAL HIGH (ref 4.0–10.5)
nRBC: 0 % (ref 0.0–0.2)

## 2024-01-02 LAB — COMPREHENSIVE METABOLIC PANEL WITH GFR
ALT: 19 U/L (ref 0–44)
AST: 27 U/L (ref 15–41)
Albumin: 4.5 g/dL (ref 3.5–5.0)
Alkaline Phosphatase: 90 U/L (ref 38–126)
Anion gap: 13 (ref 5–15)
BUN: 11 mg/dL (ref 8–23)
CO2: 22 mmol/L (ref 22–32)
Calcium: 10.3 mg/dL (ref 8.9–10.3)
Chloride: 103 mmol/L (ref 98–111)
Creatinine, Ser: 0.66 mg/dL (ref 0.44–1.00)
GFR, Estimated: >60 mL/min
Glucose, Bld: 136 mg/dL — ABNORMAL HIGH (ref 70–99)
Potassium: 3.9 mmol/L (ref 3.5–5.1)
Sodium: 138 mmol/L (ref 135–145)
Total Bilirubin: 0.4 mg/dL (ref 0.0–1.2)
Total Protein: 7.9 g/dL (ref 6.5–8.1)

## 2024-01-02 LAB — MAGNESIUM: Magnesium: 2.2 mg/dL (ref 1.7–2.4)

## 2024-01-02 MED ORDER — SODIUM CHLORIDE 0.9 % IV BOLUS
1000.0000 mL | Freq: Once | INTRAVENOUS | Status: AC
  Administered 2024-01-02: 1000 mL via INTRAVENOUS

## 2024-01-02 MED ORDER — MAGNESIUM SULFATE IN D5W 1-5 GM/100ML-% IV SOLN
1.0000 g | Freq: Once | INTRAVENOUS | Status: AC
  Administered 2024-01-02: 1 g via INTRAVENOUS
  Filled 2024-01-02: qty 100

## 2024-01-02 MED ORDER — DILTIAZEM HCL 25 MG/5ML IV SOLN
20.0000 mg | Freq: Once | INTRAVENOUS | Status: DC
  Filled 2024-01-02: qty 5

## 2024-01-02 MED ORDER — DILTIAZEM HCL 25 MG/5ML IV SOLN
10.0000 mg | Freq: Once | INTRAVENOUS | Status: AC
  Administered 2024-01-02: 10 mg via INTRAVENOUS

## 2024-01-02 NOTE — ED Notes (Signed)
 Rate appears 90-100. EDP notified. See orders to follow.

## 2024-01-02 NOTE — ED Notes (Signed)
 EDP at bedside

## 2024-01-02 NOTE — ED Notes (Signed)
 Converts to NSR, EKG taken shortly after.

## 2024-01-02 NOTE — ED Notes (Signed)
 XR at bedside

## 2024-01-02 NOTE — ED Triage Notes (Signed)
 Forgot to take morning meds yesterday Went into afib Took extra meds as directed But persistant AFIB  On eliquis  reports 1 missed dose ( yesterday morning) Denies chest pain, no increased sob

## 2024-01-02 NOTE — ED Provider Notes (Signed)
 " [place] EMERGENCY DEPARTMENT AT [facility] Provider Note  CSN: [id number] Arrival date & time: 01/02/24 1514  Chief Complaint(s) Atrial Fibrillation  HPI Bridget [name] is a 78 y.o. female who is here today for rapid heart rate.  She is a history of paroxysmal atrial fibrillation, is on daily diltiazem  with as needed diltiazem  as needed for elevated heart rate.  She takes Eliquis , but tells me she missed a dose yesterday.  She has otherwise been feeling fine.   Past Medical History Past Medical History:  Diagnosis Date   A-fib (HCC)    Arthritis    Cancer (HCC)    basal cell cancer removed on face   Hypertension    Seasonal allergies    Sleep apnea    Patient Active Problem List   Diagnosis Date Noted   Status post total left knee replacement 10/18/2021   Degenerative arthritis of left knee 11/24/2019   Hypercoagulable state due to paroxysmal atrial fibrillation (HCC) 01/26/2019   Greater trochanteric bursitis of right hip 09/23/2018   Lumbar radiculopathy 08/12/2018   Gluteal tendonitis of right buttock 07/29/2018   Atrial fibrillation (HCC) 07/21/2018   Arthritis of left acromioclavicular joint 04/26/2018   Partial nontraumatic tear of left rotator cuff 04/26/2018   Hypertension    Seasonal allergies    Home Medication(s) Prior to Admission medications  Medication Sig Start Date End Date Taking? Authorizing Provider  alendronate (FOSAMAX) 70 MG tablet Take 70 mg by mouth once a week. 04/07/22   [provider]  benazepril  (LOTENSIN ) 40 MG tablet TAKE 1 TABLET BY MOUTH EVERY DAY 10/21/23   Shlomo Wilbert SAUNDERS, MD  calcium  carbonate (OSCAL) 1500 (600 Ca) MG TABS tablet Take 600 mg of elemental calcium  by mouth daily with breakfast.    [provider]  cephALEXin  (KEFLEX ) 250 MG capsule Take 250 mg by mouth 3 (three) times daily. 11/13/23   [provider]  cholecalciferol  (VITAMIN D3) 25 MCG (1000 UNIT) tablet Take 1,000 Units by mouth  daily.    [provider]  diltiazem  (CARDIZEM  CD) 240 MG 24 hr capsule Take 1 capsule (240 mg total) by mouth daily. 08/31/23   Shlomo Wilbert SAUNDERS, MD  diltiazem  (CARDIZEM ) 30 MG tablet Take 1 tablet every 4 hours AS NEEDED for AFIB heart rate >100 as long as top BP >100. 07/15/23   Turner, Wilbert SAUNDERS, MD  ELIQUIS  5 MG TABS tablet TAKE 1 TABLET BY MOUTH TWICE A DAY 09/28/23   Turner, Traci R, MD  fluticasone (FLONASE) 50 MCG/ACT nasal spray Place 1 spray into both nostrils daily as needed for allergies.    [provider]  furosemide  (LASIX ) 20 MG tablet Take 1 tablet (20 mg total) by mouth daily. 10/21/23   Shlomo Wilbert SAUNDERS, MD  Glucose Blood (BLOOD GLUCOSE TEST STRIPS) STRP Check once a day In Vitro , lancet, meter for 90 days 05/20/22   [provider]  loratadine (CLARITIN) 10 MG tablet Take 1 tablet by mouth daily as needed for allergies.    [provider]  meclizine  (ANTIVERT ) 25 MG tablet Take 1 tablet (25 mg total) by mouth 3 (three) times daily as needed for dizziness. 11/27/23   Fenton, Clint R, PA  Multiple Vitamin (MULTIVITAMIN WITH MINERALS) TABS tablet Take 1 tablet by mouth daily.    [provider]  omeprazole (PRILOSEC) 40 MG capsule  06/29/23   [provider]  potassium chloride  SA (KLOR-CON  M) 20 MEQ tablet TAKE 1 TABLET BY  MOUTH EVERY DAY 07/23/23   Shlomo Wilbert SAUNDERS, MD  pravastatin  (PRAVACHOL ) 20 MG tablet Take 20 mg by mouth at bedtime. 04/03/15   [provider]                                                                                                                                    Past Surgical History Past Surgical History:  Procedure Laterality Date   BREAST EXCISIONAL BIOPSY Left    CHOLECYSTECTOMY  02/2010   KNEE SURGERY Right    Meniscal tear   OOPHORECTOMY  01/2003   RSO AT [facility]   TOTAL KNEE ARTHROPLASTY Left 10/18/2021   Procedure: LEFT TOTAL KNEE ARTHROPLASTY;  Surgeon: Vernetta Lonni GRADE, MD;   Location: [facility] ORS;  Service: Orthopedics;  Laterality: Left;   VAGINAL HYSTERECTOMY  01/2003   [facility],RSO A&P REPAIR   WISDOM TOOTH EXTRACTION     Family History Family History  Problem Relation Age of Onset   Heart disease Mother    Diabetes Father        AODM   Hypertension Father    Cancer Father        LUNG   Cancer Brother 75       BLADDER   Breast cancer Sister 88    Social History Social History[1] Allergies Codeine  Review of Systems Review of Systems  Physical Exam Vital Signs  I have reviewed the triage vital signs BP (!) 141/73   Pulse (!) 59   Temp (!) 97.5 F (36.4 C)   Resp 18   LMP 01/07/2003   SpO2 100%   Physical Exam Vitals and nursing note reviewed.  Constitutional:      Appearance: She is not toxic-appearing.  HENT:     Head: Normocephalic and atraumatic.  Cardiovascular:     Rate and Rhythm: Tachycardia present. Rhythm irregular.  Pulmonary:     Effort: Pulmonary effort is normal.  Abdominal:     General: Abdomen is flat.  Musculoskeletal:        General: Normal range of motion.  Neurological:     General: No focal deficit present.     Mental Status: She is alert and oriented to person, place, and time.     ED Results and Treatments Labs (all labs ordered are listed, but only abnormal results are displayed) Labs Reviewed  COMPREHENSIVE METABOLIC PANEL WITH GFR - Abnormal; Notable for the following components:      Result Value   Glucose, Bld 136 (*)    All other components within normal limits  CBC WITH DIFFERENTIAL/PLATELET - Abnormal; Notable for the following components:   WBC 11.7 (*)    RBC 5.37 (*)    Neutro Abs 9.2 (*)    All other components within normal limits  MAGNESIUM   Radiology DG Chest Port 1 View Result Date: 01/02/2024 EXAM: 1 VIEW(S) XRAY OF THE CHEST 01/02/2024 04:02:16 PM COMPARISON:  Chest x-ray 05/10/2022. CLINICAL HISTORY: cough FINDINGS: LUNGS AND PLEURA: No focal pulmonary opacity. No pleural effusion. No pneumothorax. HEART AND MEDIASTINUM: No acute abnormality of the cardiac and mediastinal silhouettes. BONES AND SOFT TISSUES: No acute osseous abnormality. IMPRESSION: 1. No acute process. Electronically signed by: Greig Pique MD 01/02/2024 05:03 PM EST RP Workstation: [id number]    Pertinent labs & imaging results that were available during my care of the patient were reviewed by me and considered in my medical decision making (see MDM for details).  Medications Ordered in ED Medications  sodium chloride  0.9 % bolus 1,000 mL (0 mLs Intravenous Stopped 01/02/24 1635)  diltiazem  (CARDIZEM ) injection 10 mg (10 mg Intravenous Given 01/02/24 1556)  magnesium  sulfate IVPB 1 g 100 mL (0 g Intravenous Stopped 01/02/24 1659)                                                                                                                                     Procedures .Critical Care  Performed by: Mannie Fairy DASEN, DO Authorized by: Mannie Fairy DASEN, DO   Critical care provider statement:    Critical care time (minutes):  33   Critical care was necessary to treat or prevent imminent or life-threatening deterioration of the following conditions:  Cardiac failure   Critical care was time spent personally by me on the following activities:  Development of treatment plan with patient or surrogate, discussions with consultants, evaluation of patient's response to treatment, examination of patient, ordering and review of laboratory studies, ordering and review of radiographic studies, ordering and performing treatments and interventions, pulse oximetry, re-evaluation of patient's condition and review of old charts   (including critical care time)  Medical Decision Making / ED Course   This patient presents to the ED for concern of elevated heart rate, this involves an  extensive number of treatment options, and is a complaint that carries with it a high risk of complications and morbidity.  The differential diagnosis includes atrial fibrillation, dehydration, electrolyte abnormalities  MDM: 78 year old female here today with atrial fibrillation.  She has recently missed a dose of her Eliquis .  Plan-patient overall well-appearing.  Nontoxic, likely paroxysmal atrial fibrillation.  Provided patient with some IV diltiazem  which did lead to termination of her atrial fibrillation.  Also provided her with some fluids and magnesium .  On repeat at EKG, patient's heart rate in the 60s, while here in the ED has been in the 60s.  Patient ambulatory without any difficulty.  She is appropriate for discharge.   Additional history obtained:  -External records from outside source obtained and reviewed including: Chart review including previous notes, labs, imaging, consultation notes   Lab Tests: -I ordered, reviewed, and interpreted labs.   The pertinent results include:   Labs Reviewed  COMPREHENSIVE METABOLIC PANEL  WITH GFR - Abnormal; Notable for the following components:      Result Value   Glucose, Bld 136 (*)    All other components within normal limits  CBC WITH DIFFERENTIAL/PLATELET - Abnormal; Notable for the following components:   WBC 11.7 (*)    RBC 5.37 (*)    Neutro Abs 9.2 (*)    All other components within normal limits  MAGNESIUM       EKG atrial flutter with 2-1 conduction  EKG Interpretation Date/Time:  Saturday January 02 2024 16:25:34 EST Ventricular Rate:  60 PR Interval:  175 QRS Duration:  96 QT Interval:  452 QTC Calculation: 452 R Axis:   2  Text Interpretation: Sinus rhythm Atrial premature complex Low voltage, precordial leads Confirmed by Mannie Pac [phone]) on 01/02/2024 5:10:31 PM         Imaging Studies ordered: I ordered imaging studies including chest x-ray I independently visualized and interpreted  imaging. I agree with the radiologist interpretation   Medicines ordered and prescription drug management: Meds ordered this encounter  Medications   DISCONTD: diltiazem  (CARDIZEM ) injection 20 mg   sodium chloride  0.9 % bolus 1,000 mL   diltiazem  (CARDIZEM ) injection 10 mg   magnesium  sulfate IVPB 1 g 100 mL    -I have reviewed the patients home medicines and have made adjustments as needed  Critical interventions Management of atrial fibrillation with rapid ventricular rate  Cardiac Monitoring: The patient was maintained on a cardiac monitor.  I personally viewed and interpreted the cardiac monitored which showed an underlying rhythm of: Normal sinus rhythm  Social Determinants of Health:  Factors impacting patients care include: Supple medical comorbidities including atrial fibrillation and anticoagulated status   Reevaluation: After the interventions noted above, I reevaluated the patient and found that they have :improved  Co morbidities that complicate the patient evaluation  Past Medical History:  Diagnosis Date   A-fib (HCC)    Arthritis    Cancer (HCC)    basal cell cancer removed on face   Hypertension    Seasonal allergies    Sleep apnea       Dispostion: I considered admission for this patient, however with her improvement she is appropriate for discharge.     Final Clinical Impression(s) / ED Diagnoses Final diagnoses:  Atrial fibrillation with rapid ventricular response (HCC)     @PCDICTATION @     [1]  Social History Tobacco Use   Smoking status: Never   Smokeless tobacco: Never   Tobacco comments:    Never smoked 11/27/22  Vaping Use   Vaping status: Never Used  Substance Use Topics   Alcohol use: Yes    Comment: SELDOM   Drug use: No     Mannie Pac T, DO 01/02/24 1712  "

## 2024-01-02 NOTE — Discharge Instructions (Addendum)
 You may take all of your medications this evening.  Please follow-up with your cardiologist, I recommend calling them on Monday to let them know that you are seen in the emergency room for atrial fibrillation with a heart rate in the 140s, but it corrected with IV diltiazem .  They may want to make some changes to your medications.  Return to the emergency department if you develop lightheadedness, pain in your chest or lose consciousness.

## 2024-01-21 ENCOUNTER — Ambulatory Visit (HOSPITAL_COMMUNITY)
Admission: RE | Admit: 2024-01-21 | Discharge: 2024-01-21 | Disposition: A | Discharge status: Home / Self Care | Source: Ambulatory Visit | Attending: Physician Assistant | Admitting: Physician Assistant

## 2024-01-21 ENCOUNTER — Other Ambulatory Visit (HOSPITAL_BASED_OUTPATIENT_CLINIC_OR_DEPARTMENT_OTHER): Payer: Self-pay | Admitting: Family Medicine

## 2024-01-21 VITALS — BP 148/72 | HR 63 | Ht 64.0 in | Wt 225.9 lb

## 2024-01-21 DIAGNOSIS — I10 Essential (primary) hypertension: Secondary | ICD-10-CM | POA: Insufficient documentation

## 2024-01-21 DIAGNOSIS — D6869 Other thrombophilia: Secondary | ICD-10-CM | POA: Insufficient documentation

## 2024-01-21 DIAGNOSIS — I480 Paroxysmal atrial fibrillation: Secondary | ICD-10-CM | POA: Insufficient documentation

## 2024-01-21 DIAGNOSIS — I4891 Unspecified atrial fibrillation: Secondary | ICD-10-CM | POA: Diagnosis not present

## 2024-01-21 DIAGNOSIS — M810 Age-related osteoporosis without current pathological fracture: Secondary | ICD-10-CM

## 2024-01-21 DIAGNOSIS — I484 Atypical atrial flutter: Secondary | ICD-10-CM | POA: Diagnosis not present

## 2024-01-21 MED ORDER — DILTIAZEM HCL ER COATED BEADS 300 MG PO CP24: 300.0000 mg | ORAL_CAPSULE | Freq: Every day | ORAL | 3 refills | Status: AC

## 2024-01-21 NOTE — Progress Notes (Signed)
 "   Primary Care Physician: Dyane Anthony RAMAN, FNP Referring Physician: [facility] Primary Cardiologist: Dr Shlomo Nena JINNY Bridget [name] is a 79 y.o. female with a history of HTN, OSA, atrial flutter, atrial fibrillation who presents for follow up in the [facility]. Patient was in her usual state of health until the early morning hours of 05/11/18 when she woke with heart racing, palpitations, and diaphoresis. No associated chest discomfort or SOB. She presented to the ER and was found to be in afib with RVR. Cardioversion was attempted x4 but was unsuccessful. Of note, her K+ was 2.9 and patient admits she had diarrhea for four days prior. She denies ever having symptoms like this before. She was started on Eliquis  for stroke prevention and metoprolol  and discharged in rate controlled afib. She spontaneously converted to SR prior to follow up. She denies significant alcohol use.  Patient was seen at the ED 05/10/22 with sudden onset tachypalpitations after waking that morning. ECG showed afib with RVR. She spontaneously converted to SR. Of note, she was being treated with prednisone  for hip pain at the time. That was her first known episode of afib since 2022.   Patient returns for follow up for atrial fibrillation. She was seen at the ED 01/02/24 with tachypalpitations. ECG showed rapid atrial flutter. She converted to SR after receiving IV diltiazem . She remains in SR today and feels well. She missed her morning medications on the day her afib started and wonders if this could have contributed to her afib. No bleeding issues on anticoagulation.   Today, she  denies symptoms of palpitations, chest pain, shortness of breath, orthopnea, PND, dizziness, presyncope, syncope, bleeding, or neurologic sequela. The patient is tolerating medications without difficulties and is otherwise without complaint today.    Atrial Fibrillation Risk Factors:  she does have a diagnosis of sleep  apnea. she does not have a history of rheumatic fever. she does not have a history of alcohol use. The patient does not have a history of early familial atrial fibrillation or other arrhythmias.   Atrial Fibrillation Management history:  Previous antiarrhythmic drugs: none Previous cardioversions: 2020 Previous ablations: none Anticoagulation history: Eliquis    Past Medical History:  Diagnosis Date   A-fib (HCC)    Arthritis    Cancer (HCC)    basal cell cancer removed on face   Hypertension    Seasonal allergies    Sleep apnea     Current Outpatient Medications  Medication Sig Dispense Refill   alendronate (FOSAMAX) 70 MG tablet Take 70 mg by mouth once a week.     benazepril  (LOTENSIN ) 40 MG tablet TAKE 1 TABLET BY MOUTH EVERY DAY 90 tablet 2   calcium  carbonate (OSCAL) 1500 (600 Ca) MG TABS tablet Take 600 mg of elemental calcium  by mouth daily with breakfast.     cephALEXin  (KEFLEX ) 250 MG capsule Take 250 mg by mouth 3 (three) times daily. (Patient taking differently: Take 250 mg by mouth every morning.)     cholecalciferol  (VITAMIN D3) 25 MCG (1000 UNIT) tablet Take 1,000 Units by mouth daily.     diltiazem  (CARDIZEM ) 30 MG tablet Take 1 tablet every 4 hours AS NEEDED for AFIB heart rate >100 as long as top BP >100. 30 tablet 1   ELIQUIS  5 MG TABS tablet TAKE 1 TABLET BY MOUTH TWICE A DAY 180 tablet 1   Famotidine (ACID REDUCER PO) Take 1 tablet by mouth every morning.  fluticasone (FLONASE) 50 MCG/ACT nasal spray Place 1 spray into both nostrils daily as needed for allergies.     furosemide  (LASIX ) 20 MG tablet Take 1 tablet (20 mg total) by mouth daily. 90 tablet 2   Glucose Blood (BLOOD GLUCOSE TEST STRIPS) STRP Check once a day In Vitro , lancet, meter for 90 days     loratadine (CLARITIN) 10 MG tablet Take 1 tablet by mouth daily as needed for allergies. (Patient taking differently: Take 1 tablet by mouth as needed for allergies.)     meclizine  (ANTIVERT ) 25 MG  tablet Take 1 tablet (25 mg total) by mouth 3 (three) times daily as needed for dizziness. 10 tablet 0   Multiple Vitamin (MULTIVITAMIN WITH MINERALS) TABS tablet Take 1 tablet by mouth daily.     nystatin  cream (MYCOSTATIN ) Apply 1 Application topically as needed.     potassium chloride  SA (KLOR-CON  M) 20 MEQ tablet TAKE 1 TABLET BY MOUTH EVERY DAY 90 tablet 3   pravastatin  (PRAVACHOL ) 20 MG tablet Take 20 mg by mouth at bedtime.     diltiazem  (CARDIZEM  CD) 300 MG 24 hr capsule Take 1 capsule (300 mg total) by mouth daily. 30 capsule 3   No current facility-administered medications for this encounter.    ROS- All systems are reviewed and negative except as per the HPI above.  Physical Exam: Vitals:   01/21/24 1100  BP: (!) 148/72  Pulse: 63  Weight: 102.5 kg  Height: 5' 4 (1.626 m)    GEN: Well nourished, well developed in no acute distress CARDIAC: Regular rate and rhythm, no murmurs, rubs, gallops RESPIRATORY:  Clear to auscultation without rales, wheezing or rhonchi  ABDOMEN: Soft, non-tender, non-distended EXTREMITIES:  non pitting bilateral lower extremity edema, No deformity    Wt Readings from Last 3 Encounters:  01/21/24 102.5 kg  11/27/23 102.1 kg  07/15/23 101.7 kg    EKG Interpretation Date/Time:  Thursday January 21 2024 11:09:21 EST Ventricular Rate:  63 PR Interval:  166 QRS Duration:  76 QT Interval:  428 QTC Calculation: 437 R Axis:   19  Text Interpretation: Normal sinus rhythm Low voltage QRS Cannot rule out Anterior infarct , age undetermined Abnormal ECG When compared with ECG of 02-Jan-2024 16:25, No significant change was found Confirmed by Rawlins Stuard ([id number]) on 01/21/2024 11:20:13 AM    Echo 08/20/23  1. Left ventricular ejection fraction, by estimation, is 65 to 70%. The  left ventricle has normal function. The left ventricle has no regional  wall motion abnormalities. Left ventricular diastolic parameters are  consistent with Grade I  diastolic dysfunction (impaired relaxation).   2. Right ventricular systolic function is normal. The right ventricular  size is normal.   3. The mitral valve is normal in structure. Trivial mitral valve  regurgitation. No evidence of mitral stenosis.   4. The aortic valve is tricuspid. There is mild calcification of the  aortic valve. Aortic valve regurgitation is not visualized. Aortic valve  sclerosis/calcification is present, without any evidence of aortic  stenosis.   5. The inferior vena cava is normal in size with greater than 50%  respiratory variability, suggesting right atrial pressure of 3 mmHg.    Epic records are reviewed at length today   CHA2DS2-VASc Score = 4  The patient's score is based upon: CHF History: 0 HTN History: 1 Diabetes History: 0 Stroke History: 0 Vascular Disease History: 0 Age Score: 2 Gender Score: 1       ASSESSMENT AND  PLAN: Paroxysmal Atrial Fibrillation/atrial flutter (ICD10:  I48.0) The patient's CHA2DS2-VASc score is 4, indicating a 4.8% annual risk of stroke.   Patient in SR today. We discussed rhythm control options including increasing diltiazem , AAD (Multaq, flecainide, dofetilide, amiodarone), and ablation.   Increase diltiazem  to 300 mg daily with 30 mg PRN q 4 hours for heart racing. Continue Eliquis  5 mg BID If she has more frequent afib, she would consider AAD.   Secondary Hypercoagulable State (ICD10:  D68.69) The patient is at significant risk for stroke/thromboembolism based upon her CHA2DS2-VASc Score of 4.  Continue Apixaban  (Eliquis ). No bleeding issues.   Obesity Body mass index is 38.78 kg/m.  Encouraged lifestyle modification  OSA  Encouraged nightly CPAP  HTN Mildly elevated today. Increase diltiazem  to 300 mg daily as above.   Follow up in the AF clinic in 4-6 weeks.    Daril Kicks PA-C Afib Clinic [facility] [address] [place], [place] [address] [phone] 01/21/2024 11:39 AM "

## 2024-01-21 NOTE — Patient Instructions (Signed)
Increase cardizem 300mg  once a day

## 2024-03-07 ENCOUNTER — Ambulatory Visit (HOSPITAL_COMMUNITY): Admitting: Physician Assistant
